# Patient Record
Sex: Female | Born: 1950 | Race: Black or African American | Hispanic: Refuse to answer | Marital: Married | Smoking: Never smoker
Health system: Southern US, Community
[De-identification: ages and names within clinical notes are randomized; demographics above are authoritative.]

## PROBLEM LIST (undated history)

## (undated) DIAGNOSIS — E7800 Pure hypercholesterolemia, unspecified: Secondary | ICD-10-CM

## (undated) DIAGNOSIS — R519 Headache, unspecified: Secondary | ICD-10-CM

## (undated) DIAGNOSIS — M1990 Unspecified osteoarthritis, unspecified site: Secondary | ICD-10-CM

## (undated) DIAGNOSIS — R51 Headache: Secondary | ICD-10-CM

## (undated) DIAGNOSIS — I10 Essential (primary) hypertension: Secondary | ICD-10-CM

## (undated) DIAGNOSIS — K219 Gastro-esophageal reflux disease without esophagitis: Secondary | ICD-10-CM

## (undated) DIAGNOSIS — D649 Anemia, unspecified: Secondary | ICD-10-CM

## (undated) DIAGNOSIS — E119 Type 2 diabetes mellitus without complications: Secondary | ICD-10-CM

## (undated) DIAGNOSIS — I82409 Acute embolism and thrombosis of unspecified deep veins of unspecified lower extremity: Secondary | ICD-10-CM

## (undated) HISTORY — PX: ESOPHAGOGASTRODUODENOSCOPY: SHX1529

## (undated) HISTORY — PX: COLONOSCOPY: SHX174

---

## 1992-12-27 HISTORY — PX: KIDNEY DONATION: SHX685

## 1998-12-27 HISTORY — PX: LIPOMA EXCISION: SHX5283

## 1998-12-27 HISTORY — PX: CARPAL TUNNEL RELEASE: SHX101

## 1999-10-16 ENCOUNTER — Ambulatory Visit (HOSPITAL_COMMUNITY): Admission: RE | Admit: 1999-10-16 | Discharge: 1999-10-16 | Payer: Self-pay | Admitting: General Surgery

## 1999-10-16 ENCOUNTER — Encounter (INDEPENDENT_AMBULATORY_CARE_PROVIDER_SITE_OTHER): Payer: Self-pay | Admitting: Specialist

## 2000-02-19 ENCOUNTER — Encounter: Admission: RE | Admit: 2000-02-19 | Discharge: 2000-02-19 | Payer: Self-pay | Admitting: Orthopedic Surgery

## 2000-02-19 ENCOUNTER — Encounter: Payer: Self-pay | Admitting: Orthopedic Surgery

## 2000-04-11 ENCOUNTER — Encounter: Payer: Self-pay | Admitting: Obstetrics and Gynecology

## 2000-04-11 ENCOUNTER — Encounter: Admission: RE | Admit: 2000-04-11 | Discharge: 2000-04-11 | Payer: Self-pay | Admitting: Obstetrics and Gynecology

## 2001-10-04 ENCOUNTER — Encounter: Payer: Self-pay | Admitting: Obstetrics and Gynecology

## 2001-10-04 ENCOUNTER — Encounter: Admission: RE | Admit: 2001-10-04 | Discharge: 2001-10-04 | Payer: Self-pay | Admitting: Obstetrics and Gynecology

## 2001-10-25 ENCOUNTER — Encounter: Admission: RE | Admit: 2001-10-25 | Discharge: 2001-10-25 | Payer: Self-pay | Admitting: Obstetrics and Gynecology

## 2001-10-25 ENCOUNTER — Encounter: Payer: Self-pay | Admitting: Obstetrics and Gynecology

## 2002-10-12 ENCOUNTER — Encounter: Admission: RE | Admit: 2002-10-12 | Discharge: 2002-10-12 | Payer: Self-pay | Admitting: Obstetrics and Gynecology

## 2002-10-12 ENCOUNTER — Encounter: Payer: Self-pay | Admitting: Obstetrics and Gynecology

## 2003-11-13 ENCOUNTER — Encounter: Admission: RE | Admit: 2003-11-13 | Discharge: 2003-11-13 | Payer: Self-pay | Admitting: Obstetrics and Gynecology

## 2004-12-01 ENCOUNTER — Encounter: Admission: RE | Admit: 2004-12-01 | Discharge: 2004-12-01 | Payer: Self-pay | Admitting: Obstetrics and Gynecology

## 2004-12-27 HISTORY — PX: ANTERIOR CERVICAL DECOMP/DISCECTOMY FUSION: SHX1161

## 2005-02-08 ENCOUNTER — Encounter: Admission: RE | Admit: 2005-02-08 | Discharge: 2005-03-02 | Payer: Self-pay | Admitting: Specialist

## 2005-03-26 ENCOUNTER — Ambulatory Visit (HOSPITAL_COMMUNITY): Admission: RE | Admit: 2005-03-26 | Discharge: 2005-03-27 | Payer: Self-pay | Admitting: Specialist

## 2005-07-02 ENCOUNTER — Emergency Department (HOSPITAL_COMMUNITY): Admission: EM | Admit: 2005-07-02 | Discharge: 2005-07-03 | Payer: Self-pay | Admitting: Emergency Medicine

## 2005-12-02 ENCOUNTER — Encounter: Admission: RE | Admit: 2005-12-02 | Discharge: 2005-12-02 | Payer: Self-pay | Admitting: Obstetrics and Gynecology

## 2005-12-27 DIAGNOSIS — I82409 Acute embolism and thrombosis of unspecified deep veins of unspecified lower extremity: Secondary | ICD-10-CM

## 2005-12-27 HISTORY — DX: Acute embolism and thrombosis of unspecified deep veins of unspecified lower extremity: I82.409

## 2006-01-01 IMAGING — CR DG CHEST 2V
2 series · 2 of 2 positions shown · non-contrast
Comparison: none

CLINICAL DATA: Preoperative respiratory evaluation.  
 CHEST - 2 VIEWS:
 PA and lateral views of the chest dated 03/22/05 are reviewed without prior films.  
 The heart, pulmonary vascularity is felt to be within normal limits.  The bronchovascular markings are prominent but no confluence to suggest active inflammatory change is noted.

[view not recorded (1 of 2)]
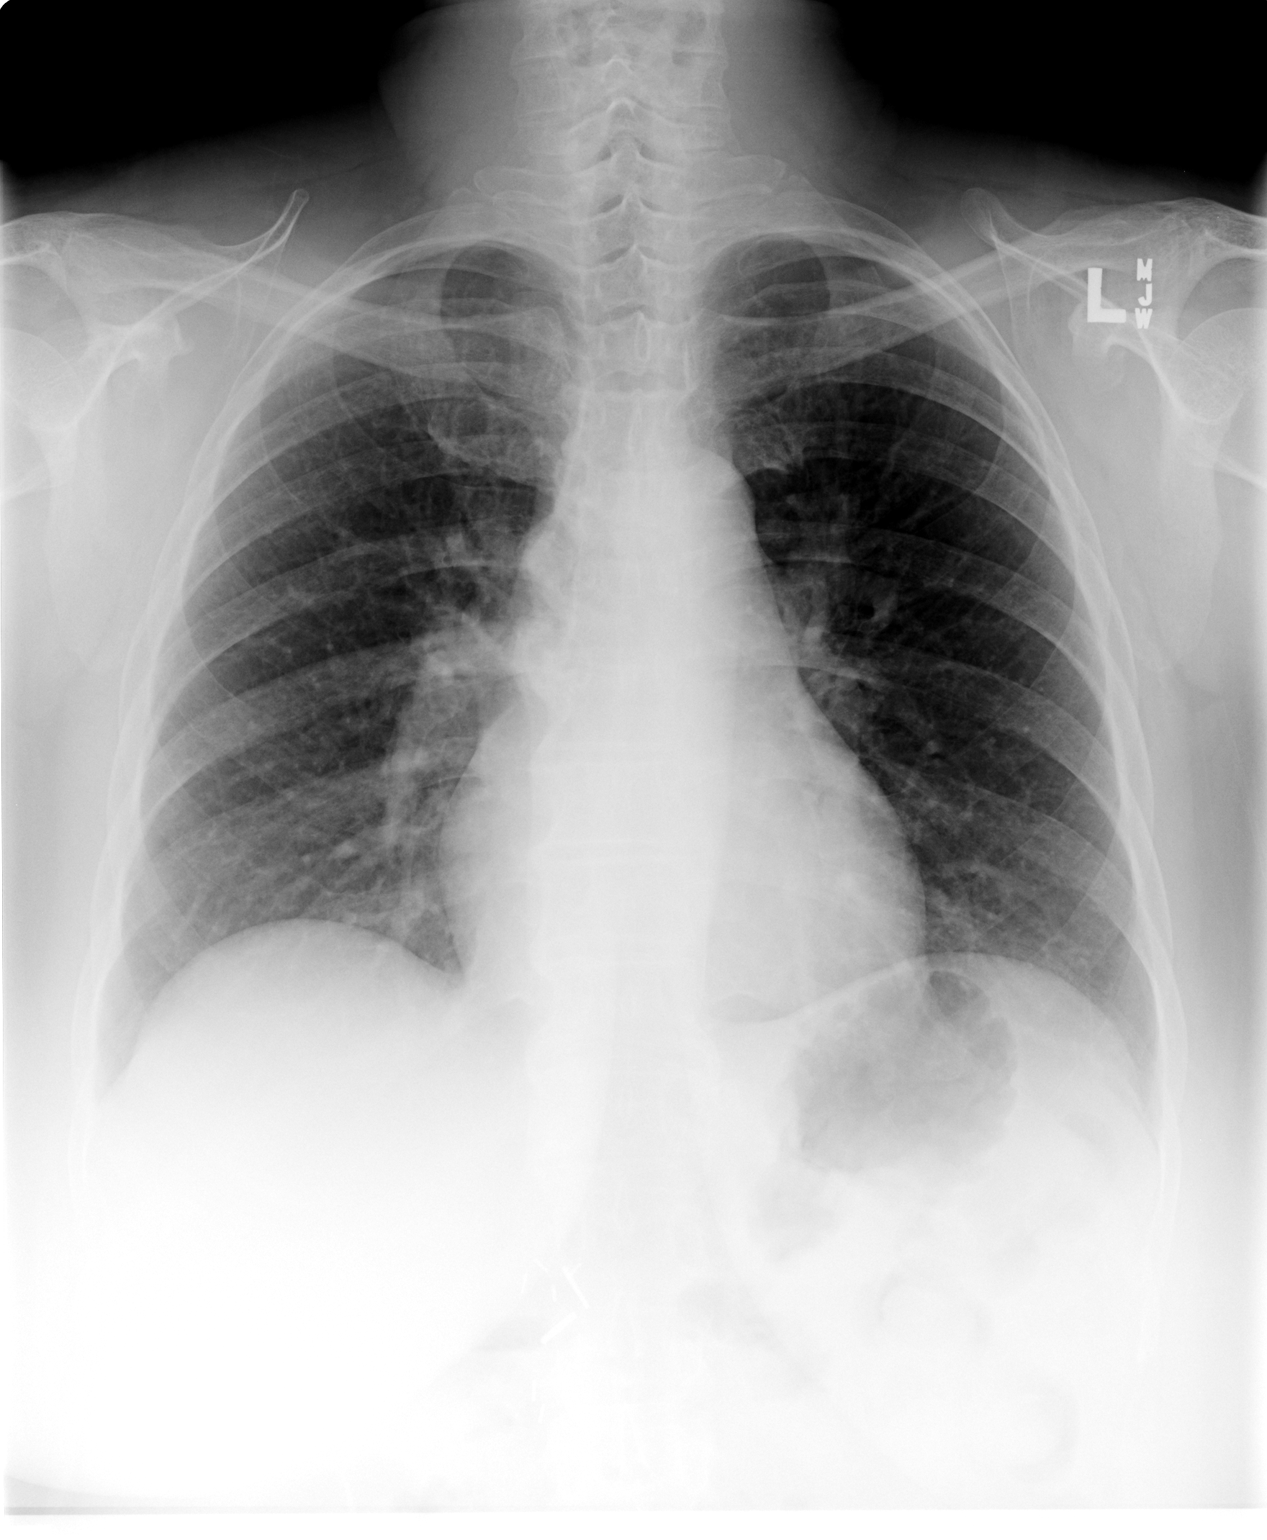

[view not recorded (2 of 2)]
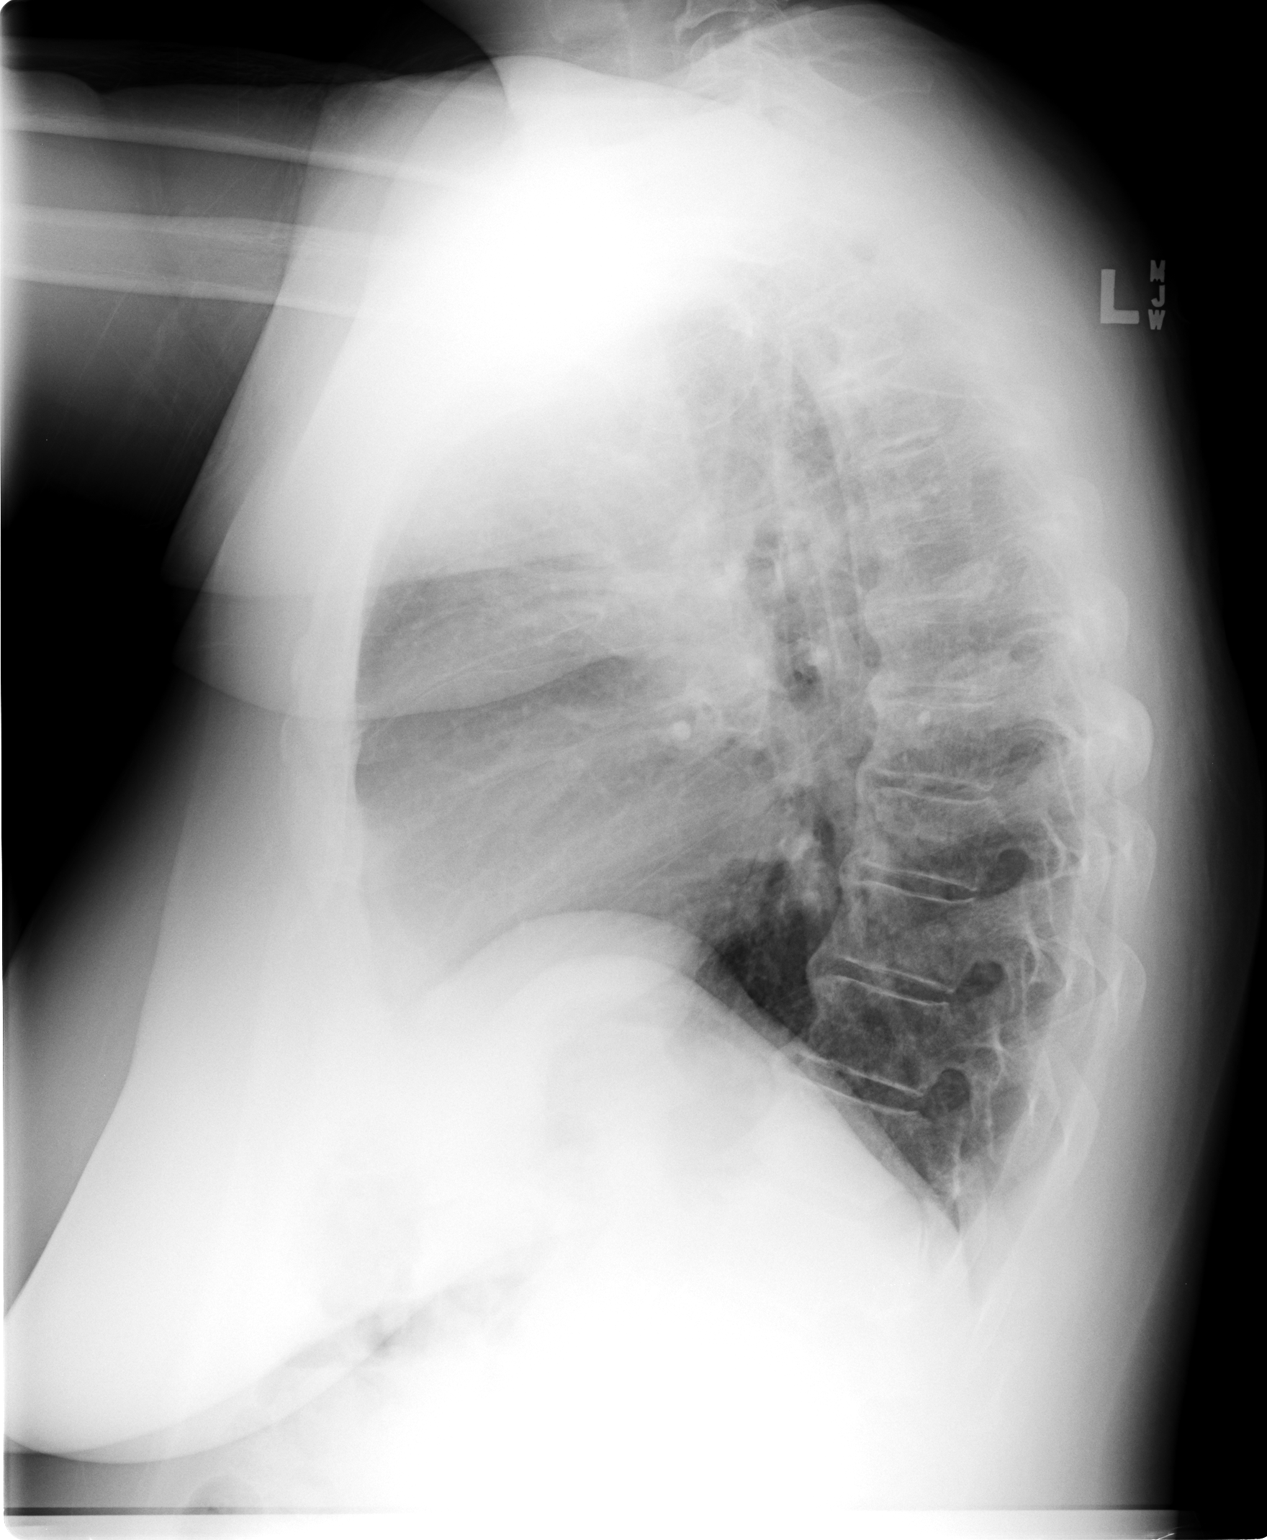

[2 of 2 positions shown; findings below may reference images not displayed]

IMPRESSION: Negative chest for active disease.

## 2006-02-16 ENCOUNTER — Emergency Department (HOSPITAL_COMMUNITY): Admission: EM | Admit: 2006-02-16 | Discharge: 2006-02-16 | Payer: Self-pay | Admitting: Family Medicine

## 2006-02-17 ENCOUNTER — Ambulatory Visit (HOSPITAL_COMMUNITY): Admission: RE | Admit: 2006-02-17 | Discharge: 2006-02-17 | Payer: Self-pay | Admitting: Family Medicine

## 2006-02-17 ENCOUNTER — Emergency Department (HOSPITAL_COMMUNITY): Admission: EM | Admit: 2006-02-17 | Discharge: 2006-02-17 | Payer: Self-pay | Admitting: Family Medicine

## 2006-12-05 ENCOUNTER — Encounter: Admission: RE | Admit: 2006-12-05 | Discharge: 2006-12-05 | Payer: Self-pay | Admitting: Obstetrics and Gynecology

## 2007-03-14 ENCOUNTER — Encounter: Admission: RE | Admit: 2007-03-14 | Discharge: 2007-03-14 | Payer: Self-pay | Admitting: Orthopedic Surgery

## 2008-01-16 ENCOUNTER — Encounter: Admission: RE | Admit: 2008-01-16 | Discharge: 2008-01-16 | Payer: Self-pay | Admitting: Obstetrics and Gynecology

## 2009-01-16 ENCOUNTER — Encounter: Admission: RE | Admit: 2009-01-16 | Discharge: 2009-01-16 | Payer: Self-pay | Admitting: Internal Medicine

## 2009-07-04 ENCOUNTER — Emergency Department (HOSPITAL_COMMUNITY): Admission: EM | Admit: 2009-07-04 | Discharge: 2009-07-04 | Payer: Self-pay | Admitting: Emergency Medicine

## 2010-01-19 ENCOUNTER — Encounter: Admission: RE | Admit: 2010-01-19 | Discharge: 2010-01-19 | Payer: Self-pay | Admitting: Internal Medicine

## 2010-09-09 ENCOUNTER — Encounter: Admission: RE | Admit: 2010-09-09 | Discharge: 2010-09-09 | Payer: Self-pay | Admitting: Specialist

## 2011-01-20 ENCOUNTER — Encounter
Admission: RE | Admit: 2011-01-20 | Discharge: 2011-01-20 | Payer: Self-pay | Source: Home / Self Care | Attending: Internal Medicine | Admitting: Internal Medicine

## 2011-04-04 LAB — DIFFERENTIAL
Basophils Absolute: 0 10*3/uL (ref 0.0–0.1)
Basophils Relative: 1 % (ref 0–1)
Eosinophils Absolute: 0.2 10*3/uL (ref 0.0–0.7)
Eosinophils Relative: 2 % (ref 0–5)
Monocytes Absolute: 0.6 10*3/uL (ref 0.1–1.0)
Neutro Abs: 5.3 10*3/uL (ref 1.7–7.7)

## 2011-04-04 LAB — BASIC METABOLIC PANEL
BUN: 17 mg/dL (ref 6–23)
GFR calc non Af Amer: >60 mL/min (ref >60)
Glucose, Bld: 96 mg/dL (ref 70–99)
Potassium: 4 mEq/L (ref 3.5–5.1)

## 2011-04-04 LAB — URINALYSIS, ROUTINE W REFLEX MICROSCOPIC
Bilirubin Urine: NEGATIVE
Hgb urine dipstick: NEGATIVE
Ketones, ur: NEGATIVE mg/dL
Protein, ur: NEGATIVE mg/dL
Urobilinogen, UA: 0.2 mg/dL (ref 0.0–1.0)

## 2011-04-04 LAB — CBC
HCT: 38.1 % (ref 36.0–46.0)
Platelets: 216 10*3/uL (ref 150–400)
RDW: 13.8 % (ref 11.5–15.5)

## 2011-05-14 NOTE — Op Note (Signed)
Diana Bowers, Diana Bowers NO.:  [id number]   MEDICAL RECORD NO.:  [id number]          PATIENT TYPE:  OIB   LOCATION:  5030                         FACILITY:  [facility]   PHYSICIAN:  Kerrin Champagne, M.D.   DATE OF BIRTH:  1951/01/13   DATE OF PROCEDURE:  03/26/2005  DATE OF DISCHARGE:                                 OPERATIVE REPORT   PREOPERATIVE DIAGNOSIS:  Degenerative disk disease with central herniated  nucleus pulposus and right-sided herniated nucleus pulposus, C5-6.   POSTOPERATIVE DIAGNOSIS:  Central herniated nucleus pulposus, C5-6.   PROCEDURES:  1.  Anterior cervical diskectomy and fusion, C5-6, with right iliac crest      bone graft harvested through a separate incision. 2. Internal fixation      with a 25 mm DePuy locking plate with four 14 mm screws.   SURGEON:  Dr. Vira Browns.   ASSISTANT:  Maud Deed, PAC.   ANESTHESIA:  GOT,  Dr. Sondra Come.   ESTIMATED BLOOD LOSS:  Sixty mL.   DRAINS:  A 7-French TLS drain, anterior neck, sewn in place.   COMPLICATIONS:  None.   BRIEF CLINICAL HISTORY:  A 60 year old female with progressive worsening  pain in her neck with radiation into her left arm greater than right which  has been in both arms at times. She has pain and numbness in the C6  distribution. She underwent preoperative studies that demonstrated protruded  disk at the C5-6 level with some extension to both sides and foraminal  narrowing causing compression of the cord at this segment. Remaining disk  levels show some very mild changes but nothing near that of the C5-6 level  and this is in keeping with her present pain pattern. She had 50% relief of  pain with nerve root block. She presents to undergo anterior diskectomy and  fusion at this site to allow [place] to decompress central portions of her spinal  canal and bilateral neuroforamen.   INTRAOPERATIVE FINDINGS:  As above.   DESCRIPTION OF PROCEDURE:  After adequate general anesthesia, the  patient in  beach-chair position and neck in slight extension with cervical halter  traction of 5 pounds. Bump under the right buttock elevating the right iliac  crest. Both right iliac crest and anterior neck prepped with DuraPrep  solution and draped in the usual manner.  Iodine Vi-Drapes were used and  standard preoperative antibiotics of vancomycin. The patient had an incision  along the left neck about a fingerbreadth and a half around the medial  clavicle in line with the patient's skin incision marked previous to prep  with a skin marker. Incision was through skin and subcutaneous layers down  to the platysma layer. Incision was approximately 4.5 cm in length. Platysma  layer was incised in line with the skin incision on the left side.  Infiltration was carried out Marcaine 0.5%,  1:200,000 epinephrine.  Following division of the platysma layer, the blunt dissection was used to  spread the interval between the trachea and esophagus medially and the  carotid sheath laterally. Interval developed using finger dissection  to the  anterior aspect of the cervical spine. A small bleeder was localized causing  some venous bleeding off the anterior jugular and this was suture-ligated  with a 2-0 Vicryl stitch. Handheld Cloward's were used to expose the  anterior aspect of the cervical spine. Prevertebral fascia was incised with  cautery along the medial border of the longus colli muscle and teased across  the midline identifying the anterior cervical spine region. Spinal needles  with sheath cut only allowing about a centimeter protrude, and were inserted  in the expected C5-6, C6-7 levels. Intraoperative lateral radiograph  demonstrated the needles at C5-6 and C6-7. Under direct visualization, then  the lower needle C6-7 was removed. Using handheld Cloward's for further  localization and exposure, carefully the upper needle was then removed and a  15 blade scalpel used to incise the disk.  Pituitary rongeur was used to  excise disk material to continue identification of this level throughout the  remainder of the case. Medial border longus colli muscle both right and left  sides carefully developed using an electrocautery, Key elevator, and  McCullough retractor inserted with the foot beneath the medial border of the  longus colli muscle. Under loupe magnification and headlamp, 14 mm screw  posts were then inserted for tumor body of C5 and C6 and distraction  obtained across the disk space. A Baer rongeur used to excise osteophytes of  the anterior aspect of the disk space of C5-6. A 3 mm Kerrison was used to  further debride anterior lip osteophytes at this level. High-speed burr was  then used to carefully remove endplates back to the posterior aspect of the  disk space and the posterior annulus. The endplates were decorticated down  to hard cortical bone and subchondral bone, and cartilaginous endplates were  fully debrided. A 1 mm Kerrison introduced over the posterior lip of C6 and  able to excise posterior lip osteophyte here freeing up the patient's  posterior annulus and resecting the posterior annulus with 1 and 2 mm  Kerrison's. The posterior inferior lip osteophyte off of the C5 level was  resected using 1 and 2 mm Kerrison's after first thinning this within the  intervertebral disk space using a high-speed burr. With this, then, the  spinal canal was felt to be completely decompressed. There did appear to be  disk material extending centrally. Both neuroforamen were decompressed using  2 mm Kerrison's.  Some epidural bleeding was encountered which was  controlled using bipolar electrocautery as well as thrombin-soaked Gelfoam.  Gelfoam was removed. Following this, then, the depth the intervertebral disk  space measured 20 mm in height, measured with the green #8 sounder by DePuy and the 8 mm graft was chosen. Right iliac crest bone harvest was obtained  by first  exposing the right iliac crest and this exposure was performed  while radiographs were being developed in regard to the neck incision about  3.5-4 cm in length through the skin and subcu layers carried down to the  superior aspect of the anterior portion of the right iliac crest. Incision  onto the superior aspect iliac crest. Then subperiosteal dissection carried  both medial and lateral protecting the medial aspect of the crest and  lateral crest using both Cobb's and a handheld Cloward. A dual oscillating  saw blade was then used to cut an 8 mm graft. The base of this graft was  then cut using a quarter inch osteotome without difficulty protected medial  structures. This graft  appeared to be excellent quality as well as excellent  height and depth,  approximately 14 mm in depth with a height of  approximately 8 mm. It was carefully tapered to dimensions in the  intervertebral disk space. The graft was then inserted into place after  careful inspection of the disk space indicated there was no further debris  present that could be retropulsed with insertion of the graft. The  intervertebral disk space was debrided of any retained Gelfoam material.  Graft was then inserted and packed into place without difficulty. Screw  posts removed of C5-C6. Bone wax applied to the bleeding screw post holes.  Five pound cervical halter traction released. High-speed burr then used to  carefully smooth the anterior lip osteophytes at the anterior aspect of disk  space of C5-6. A 25-mm plate was easily milled against the anterior aspect  of disk space to provide for even better milling, the plate was slightly  bent using the bending arch.  The plate was placed against the anterior  aspect of the vertebral bodies of C5-C6 over the disk space, and held in  place with a single retaining pin distally. Screws were then placed in the  vertebral body of C5 on the left side first drilling to 14 mm to place the   appropriate 14 mm screw.  Then on the left side of C6, drilling 14 mm to  place a 14 mm screw.  The pin was then removed and the right side C6 screw  was placed on the right side, C5 screw placed without difficulty.  Each of  these were then locked in place. Intraoperative radiograph was obtained  demonstrating the plate and screws in good position, alignment and bone  graft to be in good position and alignment without evidence of retropulsion.  This completed the operative phase procedure.  Inspection of the esophagus  demonstrated no abnormalities. A single 7-French TLS drain was placed in the  depth of the incision over the left side of neck exiting just to the right  of the midline of the incision. This was sewn in place with 2-0 nylon  stitch. The platysma layer approximated with interrupted 3-0 Vicryl sutures.  The deep subcu layers approximated with interrupted 3-0 Vicryl sutures, and the skin closed with a running subcu stitch of 4-0 Vicryl. Tincture of  Benzoin and Steri-Strips applied to the anterior neck area. Right iliac  crest bone graft harvest site carefully hemostased. Bone wax applied to the  cancellus bone surfaces and then Gelfoam. Soft tissues carefully inspected.  There was no active bleeding evident. Periosteum approximated over the iliac  crest with interrupted #1 Vicryl sutures. The deep subcu layers approximated  with interrupted #1 and 0-Vicryl sutures, the more superficial layers with  interrupted 2-0 Vicryl sutures, and the skin closed with a running subcu  stitch of 4-0 Vicryl.  Tincture of Benzoin and Steri-Strips applied. The  iliac crest bone graft harvest site shows an area of stria of the skin and,  hence, it was difficult in approximating but it was able to be approximated  with the subcu stitch. Tincture of Benzoin and Steri-Strips applied, 4x4s  fixed to the skin with Hypafix tape, as was 4x4s fixed to the skin with  Hypafix tape over the anterior neck.  The patient was placed in a  [place] collar. All instrument and sponge counts were correct. The  patient was then reactivated, extubated, and returned recovery room in  satisfactory condition. All instrument and sponge  counts again were correct.   The number [id number] thank you      JEN/MEDQ  D:  03/26/2005  T:  03/27/2005  Job:  [id number]

## 2011-12-22 ENCOUNTER — Other Ambulatory Visit: Payer: Self-pay | Admitting: Internal Medicine

## 2011-12-22 DIAGNOSIS — Z1231 Encounter for screening mammogram for malignant neoplasm of breast: Secondary | ICD-10-CM

## 2012-01-24 ENCOUNTER — Ambulatory Visit
Admission: RE | Admit: 2012-01-24 | Discharge: 2012-01-24 | Disposition: A | Discharge status: Home / Self Care | Payer: 59 | Source: Ambulatory Visit | Attending: Internal Medicine | Admitting: Internal Medicine

## 2012-01-24 DIAGNOSIS — Z1231 Encounter for screening mammogram for malignant neoplasm of breast: Secondary | ICD-10-CM

## 2012-12-21 ENCOUNTER — Encounter (HOSPITAL_COMMUNITY): Payer: Self-pay | Admitting: Pharmacy Technician

## 2012-12-25 ENCOUNTER — Encounter (HOSPITAL_COMMUNITY): Payer: Self-pay

## 2012-12-25 ENCOUNTER — Encounter (HOSPITAL_COMMUNITY)
Admission: RE | Admit: 2012-12-25 | Discharge: 2012-12-25 | Disposition: A | Discharge status: Home / Self Care | Payer: 59 | Source: Ambulatory Visit | Attending: Specialist | Admitting: Specialist

## 2012-12-25 ENCOUNTER — Ambulatory Visit (HOSPITAL_COMMUNITY)
Admission: RE | Admit: 2012-12-25 | Discharge: 2012-12-25 | Disposition: A | Discharge status: Home / Self Care | Payer: 59 | Source: Ambulatory Visit | Attending: Specialist | Admitting: Specialist

## 2012-12-25 DIAGNOSIS — Z01812 Encounter for preprocedural laboratory examination: Secondary | ICD-10-CM | POA: Insufficient documentation

## 2012-12-25 DIAGNOSIS — M5380 Other specified dorsopathies, site unspecified: Secondary | ICD-10-CM | POA: Insufficient documentation

## 2012-12-25 DIAGNOSIS — Z01818 Encounter for other preprocedural examination: Secondary | ICD-10-CM | POA: Insufficient documentation

## 2012-12-25 HISTORY — DX: Essential (primary) hypertension: I10

## 2012-12-25 HISTORY — DX: Type 2 diabetes mellitus without complications: E11.9

## 2012-12-25 HISTORY — DX: Unspecified osteoarthritis, unspecified site: M19.90

## 2012-12-25 HISTORY — DX: Gastro-esophageal reflux disease without esophagitis: K21.9

## 2012-12-25 LAB — CBC
HCT: 40.4 % (ref 36.0–46.0)
MCH: 25.3 pg — ABNORMAL LOW (ref 26.0–34.0)
MCV: 79.2 fL (ref 78.0–100.0)
RDW: 13.9 % (ref 11.5–15.5)
WBC: 9.7 10*3/uL (ref 4.0–10.5)

## 2012-12-25 LAB — ABO/RH: ABO/RH(D): O POS

## 2012-12-25 LAB — BASIC METABOLIC PANEL
BUN: 20 mg/dL (ref 6–23)
CO2: 23 mEq/L (ref 19–32)
Calcium: 9.9 mg/dL (ref 8.4–10.5)
Chloride: 102 mEq/L (ref 96–112)
Creatinine, Ser: 0.91 mg/dL (ref 0.50–1.10)
Glucose, Bld: 90 mg/dL (ref 70–99)

## 2012-12-25 LAB — TYPE AND SCREEN

## 2012-12-25 LAB — APTT: aPTT: 35 seconds (ref 24–37)

## 2012-12-25 NOTE — Pre-Procedure Instructions (Addendum)
22 Diana Bowers  12/25/2012   Your procedure is scheduled on:  Mon, Jan 6 @ 7:30 AM  Report to [facility] at 5:30 AM.  Call this number if you have problems the morning of surgery: [phone]   Remember:   Do not eat food:or drinkAfter Midnight.    Take these medicines the morning of surgery with A SIP OF WATER: Diltiazem(Cardizem) and Aciphex(Rabeprazole)   Do not wear jewelry, make-up or nail polish.  Do not wear lotions, powders, or perfumes. You may not wear deodorant.  Do not shave 48 hours prior to surgery.   Do not bring valuables to the hospital.  Contacts, dentures or bridgework may not be worn into surgery.  Leave suitcase in the car. After surgery it may be brought to your room.  For patients admitted to the hospital, checkout time is 11:00 AM the day of discharge.     Spouse Diana Bowers [phone]  Patients discharged the day of surgery will not be allowed to drive home.  Special Instructions: Shower using CHG 2 nights before surgery and the night before surgery.  If you shower the day of surgery use CHG.  Use special wash - you have one bottle of CHG for all showers.  You should use approximately 1/3 of the bottle for each shower.   Please read over the following fact sheets that you were given: Pain Booklet, Coughing and Deep Breathing, Blood Transfusion Information, Total Joint Packet, MRSA Information and Surgical Site Infection Prevention

## 2012-12-25 NOTE — Progress Notes (Signed)
Office called to release orders and put in permit verbage

## 2012-12-26 ENCOUNTER — Other Ambulatory Visit (HOSPITAL_COMMUNITY): Payer: Self-pay | Admitting: Specialist

## 2012-12-28 NOTE — H&P (Signed)
Patient examined  H&Pand lab reviewed with Dondra Spry.

## 2012-12-28 NOTE — H&P (Addendum)
TOTAL KNEE ADMISSION H&P  Patient is being admitted for left total knee arthroplasty.  Subjective:  Chief Complaint:left knee pain.  HPI: Diana Bowers, 62 y.o. female, has a history of pain and functional disability in the left knee due to arthritis and has failed non-surgical conservative treatments for greater than 12 weeks to includecorticosteriod injections, viscosupplementation injections and use of assistive devices.  Onset of symptoms was gradual, starting 5 years ago with gradually worsening course since that time. The patient noted no past surgery on the left knee(s).  Patient currently rates pain in the left knee(s) at 8 out of 10 with activity. Patient has night pain, worsening of pain with activity and weight bearing and crepitus.  Patient has evidence of periarticular osteophytes and joint space narrowing by imaging studies. There is no active infection.  There are no active problems to display for this patient.  Past Medical History  Diagnosis Date  . Hypertension   . Blood clot in vein 07    lft leg due to birth control pill  . GERD (gastroesophageal reflux disease)   . Arthritis   . Diabetes mellitus without complication     borderline no med    Past Surgical History  Procedure Date  . Cervical disc surgery 06  . Carpal tunnel release 00    rt  . 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 Lipoma excision 00    back  . Cesarean section     Prescriptions prior to admission  Medication Sig Dispense Refill  . CALCIUM PO Take 1 tablet by mouth daily.      Marland Kitchen diltiazem (CARDIZEM CD) 360 MG 24 hr capsule Take 360 mg by mouth daily.      . Pitavastatin Calcium (LIVALO) 2 MG TABS Take 2 tablets by mouth daily.      . RABEprazole (ACIPHEX) 20 MG tablet Take 20 mg by mouth daily.      . traMADol (ULTRAM) 50 MG tablet Take 100 mg by mouth every 6 (six) hours as needed.      . Vitamin D, Ergocalciferol, (DRISDOL) 50000 UNITS CAPS Take 50,000 Units by mouth every 7 (seven) days. Sunday        Allergies  Allergen Reactions  . Penicillins Hives    History  Substance Use Topics  . Smoking status: Never Smoker   . Smokeless tobacco: Not on file  . Alcohol Use: No    History reviewed. No pertinent family history.   Review of Systems  Constitutional: Negative.   HENT: Negative.   Eyes: Negative.   Respiratory: Negative.   Cardiovascular: Negative.   Gastrointestinal: Negative.   Genitourinary: Negative.   Musculoskeletal:       Left knee pain  Skin: Negative.   Neurological: Negative.   Endo/Heme/Allergies: Negative.   Psychiatric/Behavioral: Negative.     Objective:  Physical Exam  Constitutional: She is oriented to person, place, and time. She appears well-developed and well-nourished.  HENT:  Head: Normocephalic and atraumatic.  Eyes: EOM are normal. Pupils are equal, round, and reactive to light.  Neck: Normal range of motion.  Cardiovascular: Normal rate, regular rhythm, normal heart sounds and intact distal pulses.   Respiratory: Effort normal and breath sounds normal.  GI: Soft.  Musculoskeletal:       10  degree flexion contracture to the left knee.  Flexion to 110 degrees.  Varus deformity.   Neurological: She is alert and oriented to person, place, and time.  Skin: Skin is warm and dry.  Psychiatric:  She has a normal mood and affect.    Vital signs in last 24 hours: Temp:  [98.6 F (37 C)] 98.6 F (37 C) (01/06 0623) Pulse Rate:  [73] 73  (01/06 0623) Resp:  [18] 18  (01/06 0623) BP: (156)/(75) 156/75 mmHg (01/06 0623) SpO2:  [93 %] 93 % (01/06 4098)  Labs:   There is no height or weight on file to calculate BMI.   Imaging Review Plain radiographs demonstrate severe degenerative joint disease of the left knee(s). The overall alignment ismild varus. The bone quality appears to be adequate for age and reported activity level.  Assessment/Plan:  End stage arthritis, left knee   The patient history, physical examination, clinical  judgment of the provider and imaging studies are consistent with end stage degenerative joint disease of the left knee(s) and total knee arthroplasty is deemed medically necessary. The treatment options including medical management, injection therapy arthroscopy and arthroplasty were discussed at length. The risks and benefits of total knee arthroplasty were presented and reviewed. The risks due to aseptic loosening, infection, stiffness, patella tracking problems, thromboembolic complications and other imponderables were discussed. The patient acknowledged the explanation, agreed to proceed with the plan and consent was signed. Patient is being admitted for inpatient treatment for surgery, pain control, PT, OT, prophylactic antibiotics, VTE prophylaxis, progressive ambulation and ADL's and discharge planning. The patient is planning to be discharged to skilled nursing facility.  She has selected [facility] for rehab.  The patient has been re-examined, and the chart reviewed, and there have been no interval changes to the documented history and physical.    The risks, benefits, and alternatives have been discussed at length, and the patient is willing to proceed.

## 2012-12-31 MED ORDER — VANCOMYCIN HCL 10 G IV SOLR
1500.0000 mg | INTRAVENOUS | Status: AC
  Administered 2013-01-01: 1500 mg via INTRAVENOUS
  Filled 2012-12-31: qty 1500

## 2013-01-01 ENCOUNTER — Encounter (HOSPITAL_COMMUNITY): Payer: Self-pay | Admitting: Specialist

## 2013-01-01 ENCOUNTER — Encounter (HOSPITAL_COMMUNITY): Payer: Self-pay | Admitting: Anesthesiology

## 2013-01-01 ENCOUNTER — Encounter (HOSPITAL_COMMUNITY): Payer: Self-pay | Admitting: *Deleted

## 2013-01-01 ENCOUNTER — Inpatient Hospital Stay (HOSPITAL_COMMUNITY): Payer: 59 | Admitting: Anesthesiology

## 2013-01-01 ENCOUNTER — Encounter (HOSPITAL_COMMUNITY)
Admission: RE | Disposition: A | Discharge status: Skilled Nursing Facility | Payer: Self-pay | Source: Ambulatory Visit | Attending: Specialist

## 2013-01-01 ENCOUNTER — Inpatient Hospital Stay (HOSPITAL_COMMUNITY)
Admission: RE | Admit: 2013-01-01 | Discharge: 2013-01-04 | DRG: 470 | Disposition: A | Discharge status: Skilled Nursing Facility | Payer: 59 | Source: Ambulatory Visit | Attending: Specialist | Admitting: Specialist

## 2013-01-01 DIAGNOSIS — M1710 Unilateral primary osteoarthritis, unspecified knee: Principal | ICD-10-CM | POA: Diagnosis present

## 2013-01-01 DIAGNOSIS — M1712 Unilateral primary osteoarthritis, left knee: Secondary | ICD-10-CM | POA: Diagnosis present

## 2013-01-01 DIAGNOSIS — E119 Type 2 diabetes mellitus without complications: Secondary | ICD-10-CM | POA: Diagnosis present

## 2013-01-01 DIAGNOSIS — Z86718 Personal history of other venous thrombosis and embolism: Secondary | ICD-10-CM

## 2013-01-01 DIAGNOSIS — Z880 Allergy status to penicillin: Secondary | ICD-10-CM

## 2013-01-01 DIAGNOSIS — E7800 Pure hypercholesterolemia, unspecified: Secondary | ICD-10-CM | POA: Diagnosis present

## 2013-01-01 DIAGNOSIS — I10 Essential (primary) hypertension: Secondary | ICD-10-CM | POA: Diagnosis present

## 2013-01-01 DIAGNOSIS — D62 Acute posthemorrhagic anemia: Secondary | ICD-10-CM | POA: Diagnosis not present

## 2013-01-01 DIAGNOSIS — K219 Gastro-esophageal reflux disease without esophagitis: Secondary | ICD-10-CM | POA: Diagnosis present

## 2013-01-01 DIAGNOSIS — Z79899 Other long term (current) drug therapy: Secondary | ICD-10-CM

## 2013-01-01 HISTORY — PX: KNEE ARTHROPLASTY: SHX992

## 2013-01-01 HISTORY — DX: Pure hypercholesterolemia, unspecified: E78.00

## 2013-01-01 HISTORY — DX: Acute embolism and thrombosis of unspecified deep veins of unspecified lower extremity: I82.409

## 2013-01-01 HISTORY — PX: REPLACEMENT TOTAL KNEE: SUR1224

## 2013-01-01 LAB — URINALYSIS, ROUTINE W REFLEX MICROSCOPIC
Glucose, UA: NEGATIVE mg/dL
Nitrite: NEGATIVE
Protein, ur: 30 mg/dL — AB

## 2013-01-01 LAB — URINE MICROSCOPIC-ADD ON

## 2013-01-01 SURGERY — ARTHROPLASTY, KNEE, TOTAL, USING IMAGELESS COMPUTER-ASSISTED NAVIGATION
Anesthesia: General | Site: Knee | Laterality: Left | Wound class: Clean

## 2013-01-01 MED ORDER — MORPHINE SULFATE 2 MG/ML IJ SOLN: 2.0000 mg | INTRAMUSCULAR | Status: DC | PRN

## 2013-01-01 MED ORDER — OXYCODONE HCL 5 MG/5ML PO SOLN: 5.0000 mg | Freq: Once | ORAL | Status: DC | PRN

## 2013-01-01 MED ORDER — BUPIVACAINE-EPINEPHRINE PF 0.5-1:200000 % IJ SOLN
INTRAMUSCULAR | Status: DC | PRN
  Administered 2013-01-01: 150 mg

## 2013-01-01 MED ORDER — ALUM & MAG HYDROXIDE-SIMETH 200-200-20 MG/5ML PO SUSP: 30.0000 mL | ORAL | Status: DC | PRN

## 2013-01-01 MED ORDER — HYDROMORPHONE HCL PF 1 MG/ML IJ SOLN
INTRAMUSCULAR | Status: AC
  Administered 2013-01-01: 0.5 mg via INTRAVENOUS
  Filled 2013-01-01: qty 2

## 2013-01-01 MED ORDER — MORPHINE SULFATE (PF) 1 MG/ML IV SOLN
INTRAVENOUS | Status: DC
  Administered 2013-01-01: 13:00:00 via INTRAVENOUS
  Administered 2013-01-01: 9 mg via INTRAVENOUS
  Administered 2013-01-02: 1.5 mg via INTRAVENOUS

## 2013-01-01 MED ORDER — SODIUM CHLORIDE 0.9 % IJ SOLN: 9.0000 mL | INTRAMUSCULAR | Status: DC | PRN

## 2013-01-01 MED ORDER — SODIUM CHLORIDE 0.9 % IR SOLN
Status: DC | PRN
  Administered 2013-01-01: 3000 mL

## 2013-01-01 MED ORDER — OXYCODONE HCL 5 MG PO TABS
5.0000 mg | ORAL_TABLET | ORAL | Status: DC | PRN
  Administered 2013-01-02 – 2013-01-04 (×8): 10 mg via ORAL
  Filled 2013-01-01 (×8): qty 2

## 2013-01-01 MED ORDER — RIVAROXABAN 10 MG PO TABS
10.0000 mg | ORAL_TABLET | Freq: Every day | ORAL | Status: DC
  Administered 2013-01-02 – 2013-01-04 (×3): 10 mg via ORAL
  Filled 2013-01-01 (×5): qty 1

## 2013-01-01 MED ORDER — CELECOXIB 200 MG PO CAPS
200.0000 mg | ORAL_CAPSULE | Freq: Two times a day (BID) | ORAL | Status: DC
  Administered 2013-01-01 – 2013-01-02 (×2): 200 mg via ORAL
  Filled 2013-01-01 (×3): qty 1

## 2013-01-01 MED ORDER — PHENYLEPHRINE HCL 10 MG/ML IJ SOLN
INTRAMUSCULAR | Status: DC | PRN
  Administered 2013-01-01 (×2): 80 ug via INTRAVENOUS
  Administered 2013-01-01: 40 ug via INTRAVENOUS

## 2013-01-01 MED ORDER — CHLORHEXIDINE GLUCONATE 4 % EX LIQD: 60.0000 mL | Freq: Once | CUTANEOUS | Status: DC

## 2013-01-01 MED ORDER — DIPHENHYDRAMINE HCL 12.5 MG/5ML PO ELIX
12.5000 mg | ORAL_SOLUTION | Freq: Four times a day (QID) | ORAL | Status: DC | PRN
  Filled 2013-01-01: qty 10

## 2013-01-01 MED ORDER — TRAMADOL HCL 50 MG PO TABS
100.0000 mg | ORAL_TABLET | Freq: Four times a day (QID) | ORAL | Status: DC | PRN
  Administered 2013-01-02: 100 mg via ORAL
  Filled 2013-01-01: qty 2

## 2013-01-01 MED ORDER — KETOROLAC TROMETHAMINE 15 MG/ML IJ SOLN
15.0000 mg | Freq: Four times a day (QID) | INTRAMUSCULAR | Status: AC
  Administered 2013-01-01 – 2013-01-02 (×4): 15 mg via INTRAVENOUS
  Filled 2013-01-01 (×4): qty 1

## 2013-01-01 MED ORDER — HYDROMORPHONE HCL PF 1 MG/ML IJ SOLN
0.2500 mg | INTRAMUSCULAR | Status: DC | PRN
  Administered 2013-01-01 (×4): 0.5 mg via INTRAVENOUS

## 2013-01-01 MED ORDER — FENTANYL CITRATE 0.05 MG/ML IJ SOLN
INTRAMUSCULAR | Status: DC | PRN
  Administered 2013-01-01 (×3): 50 ug via INTRAVENOUS
  Administered 2013-01-01: 100 ug via INTRAVENOUS
  Administered 2013-01-01 (×2): 50 ug via INTRAVENOUS
  Administered 2013-01-01: 100 ug via INTRAVENOUS
  Administered 2013-01-01: 50 ug via INTRAVENOUS

## 2013-01-01 MED ORDER — LACTATED RINGERS IV SOLN
INTRAVENOUS | Status: DC | PRN
  Administered 2013-01-01 (×3): via INTRAVENOUS

## 2013-01-01 MED ORDER — ATORVASTATIN CALCIUM 10 MG PO TABS
10.0000 mg | ORAL_TABLET | Freq: Every day | ORAL | Status: DC
  Administered 2013-01-01 – 2013-01-03 (×3): 10 mg via ORAL
  Filled 2013-01-01 (×4): qty 1

## 2013-01-01 MED ORDER — POTASSIUM CHLORIDE IN NACL 20-0.9 MEQ/L-% IV SOLN
INTRAVENOUS | Status: DC
  Administered 2013-01-01 – 2013-01-02 (×2): via INTRAVENOUS
  Filled 2013-01-01 (×9): qty 1000

## 2013-01-01 MED ORDER — NALOXONE HCL 0.4 MG/ML IJ SOLN: 0.4000 mg | INTRAMUSCULAR | Status: DC | PRN

## 2013-01-01 MED ORDER — MENTHOL 3 MG MT LOZG: 1.0000 | LOZENGE | OROMUCOSAL | Status: DC | PRN

## 2013-01-01 MED ORDER — METOCLOPRAMIDE HCL 10 MG PO TABS: 5.0000 mg | ORAL_TABLET | Freq: Three times a day (TID) | ORAL | Status: DC | PRN

## 2013-01-01 MED ORDER — KETOROLAC TROMETHAMINE 30 MG/ML IJ SOLN
INTRAMUSCULAR | Status: AC
  Filled 2013-01-01: qty 1

## 2013-01-01 MED ORDER — 0.9 % SODIUM CHLORIDE (POUR BTL) OPTIME
TOPICAL | Status: DC | PRN
  Administered 2013-01-01: 1000 mL

## 2013-01-01 MED ORDER — ONDANSETRON HCL 4 MG/2ML IJ SOLN: 4.0000 mg | Freq: Four times a day (QID) | INTRAMUSCULAR | Status: DC | PRN

## 2013-01-01 MED ORDER — DILTIAZEM HCL ER COATED BEADS 360 MG PO CP24
360.0000 mg | ORAL_CAPSULE | Freq: Every day | ORAL | Status: DC
  Administered 2013-01-01 – 2013-01-04 (×4): 360 mg via ORAL
  Filled 2013-01-01 (×6): qty 1

## 2013-01-01 MED ORDER — PROMETHAZINE HCL 25 MG/ML IJ SOLN: 6.2500 mg | INTRAMUSCULAR | Status: DC | PRN

## 2013-01-01 MED ORDER — ACETAMINOPHEN 325 MG PO TABS: 650.0000 mg | ORAL_TABLET | Freq: Four times a day (QID) | ORAL | Status: DC | PRN

## 2013-01-01 MED ORDER — FERROUS SULFATE 325 (65 FE) MG PO TABS
325.0000 mg | ORAL_TABLET | Freq: Two times a day (BID) | ORAL | Status: DC
  Administered 2013-01-02 – 2013-01-04 (×5): 325 mg via ORAL
  Filled 2013-01-01 (×7): qty 1

## 2013-01-01 MED ORDER — MORPHINE SULFATE (PF) 1 MG/ML IV SOLN
INTRAVENOUS | Status: AC
  Filled 2013-01-01: qty 25

## 2013-01-01 MED ORDER — ONDANSETRON HCL 4 MG PO TABS: 4.0000 mg | ORAL_TABLET | Freq: Four times a day (QID) | ORAL | Status: DC | PRN

## 2013-01-01 MED ORDER — PROPOFOL 10 MG/ML IV BOLUS
INTRAVENOUS | Status: DC | PRN
  Administered 2013-01-01: 200 mg via INTRAVENOUS

## 2013-01-01 MED ORDER — MIDAZOLAM HCL 5 MG/5ML IJ SOLN
INTRAMUSCULAR | Status: DC | PRN
  Administered 2013-01-01: 2 mg via INTRAVENOUS

## 2013-01-01 MED ORDER — DIPHENHYDRAMINE HCL 50 MG/ML IJ SOLN: 12.5000 mg | Freq: Four times a day (QID) | INTRAMUSCULAR | Status: DC | PRN

## 2013-01-01 MED ORDER — PHENOL 1.4 % MT LIQD: 1.0000 | OROMUCOSAL | Status: DC | PRN

## 2013-01-01 MED ORDER — METOCLOPRAMIDE HCL 5 MG/ML IJ SOLN: 5.0000 mg | Freq: Three times a day (TID) | INTRAMUSCULAR | Status: DC | PRN

## 2013-01-01 MED ORDER — ACETAMINOPHEN 650 MG RE SUPP: 650.0000 mg | Freq: Four times a day (QID) | RECTAL | Status: DC | PRN

## 2013-01-01 MED ORDER — PHENYLEPHRINE HCL 10 MG/ML IJ SOLN
10.0000 mg | INTRAVENOUS | Status: DC | PRN
  Administered 2013-01-01: 10 ug/min via INTRAVENOUS

## 2013-01-01 MED ORDER — PANTOPRAZOLE SODIUM 40 MG PO TBEC
40.0000 mg | DELAYED_RELEASE_TABLET | Freq: Every day | ORAL | Status: DC
  Administered 2013-01-01 – 2013-01-04 (×4): 40 mg via ORAL
  Filled 2013-01-01 (×4): qty 1

## 2013-01-01 MED ORDER — ONDANSETRON HCL 4 MG/2ML IJ SOLN
INTRAMUSCULAR | Status: DC | PRN
  Administered 2013-01-01: 4 mg via INTRAVENOUS

## 2013-01-01 MED ORDER — LIDOCAINE HCL (CARDIAC) 20 MG/ML IV SOLN
INTRAVENOUS | Status: DC | PRN
  Administered 2013-01-01: 100 mg via INTRAVENOUS

## 2013-01-01 MED ORDER — OXYCODONE HCL 5 MG PO TABS: 5.0000 mg | ORAL_TABLET | Freq: Once | ORAL | Status: DC | PRN

## 2013-01-01 MED ORDER — VITAMIN D (ERGOCALCIFEROL) 1.25 MG (50000 UNIT) PO CAPS: 50000.0000 [IU] | ORAL_CAPSULE | ORAL | Status: DC

## 2013-01-01 SURGICAL SUPPLY — 70 items
ADH SKN CLS APL DERMABOND .7 (GAUZE/BANDAGES/DRESSINGS) ×1
APL SKNCLS STERI-STRIP NONHPOA (GAUZE/BANDAGES/DRESSINGS) ×1
BANDAGE ELASTIC 4 VELCRO ST LF (GAUZE/BANDAGES/DRESSINGS) ×2 IMPLANT
BANDAGE ELASTIC 6 VELCRO ST LF (GAUZE/BANDAGES/DRESSINGS) ×1 IMPLANT
BANDAGE ESMARK 6X9 LF (GAUZE/BANDAGES/DRESSINGS) ×1 IMPLANT
BENZOIN TINCTURE PRP APPL 2/3 (GAUZE/BANDAGES/DRESSINGS) ×2 IMPLANT
BLADE SAG 18X100X1.27 (BLADE) ×2 IMPLANT
BLADE SAGITTAL 25.0X1.27X90 (BLADE) IMPLANT
BLADE SAW SGTL 13X75X1.27 (BLADE) ×2 IMPLANT
BNDG CMPR 9X6 STRL LF SNTH (GAUZE/BANDAGES/DRESSINGS) ×1
BNDG CMPR MED 10X6 ELC LF (GAUZE/BANDAGES/DRESSINGS) ×1
BNDG ELASTIC 6X10 VLCR STRL LF (GAUZE/BANDAGES/DRESSINGS) ×2 IMPLANT
BNDG ESMARK 6X9 LF (GAUZE/BANDAGES/DRESSINGS) ×2
CEMENT HV SMART SET (Cement) ×2 IMPLANT
CLOTH BEACON ORANGE TIMEOUT ST (SAFETY) ×2 IMPLANT
COVER SURGICAL LIGHT HANDLE (MISCELLANEOUS) ×2 IMPLANT
CUFF TOURNIQUET SINGLE 34IN LL (TOURNIQUET CUFF) IMPLANT
CUFF TOURNIQUET SINGLE 44IN (TOURNIQUET CUFF) ×1 IMPLANT
DERMABOND ADVANCED (GAUZE/BANDAGES/DRESSINGS) ×1
DERMABOND ADVANCED .7 DNX12 (GAUZE/BANDAGES/DRESSINGS) IMPLANT
DRAPE INCISE IOBAN 66X45 STRL (DRAPES) ×1 IMPLANT
DRAPE ORTHO SPLIT 77X108 STRL (DRAPES) ×4
DRAPE SURG ORHT 6 SPLT 77X108 (DRAPES) ×2 IMPLANT
DRAPE U-SHAPE 47X51 STRL (DRAPES) ×2 IMPLANT
DRSG ADAPTIC 3X8 NADH LF (GAUZE/BANDAGES/DRESSINGS) ×2 IMPLANT
DRSG PAD ABDOMINAL 8X10 ST (GAUZE/BANDAGES/DRESSINGS) ×2 IMPLANT
DURAPREP 26ML APPLICATOR (WOUND CARE) ×2 IMPLANT
ELECT REM PT RETURN 9FT ADLT (ELECTROSURGICAL) ×2
ELECTRODE REM PT RTRN 9FT ADLT (ELECTROSURGICAL) ×1 IMPLANT
EVACUATOR 1/8 PVC DRAIN (DRAIN) ×1 IMPLANT
FACESHIELD LNG OPTICON STERILE (SAFETY) ×4 IMPLANT
GLOVE BIOGEL PI IND STRL 7.5 (GLOVE) ×1 IMPLANT
GLOVE BIOGEL PI INDICATOR 7.5 (GLOVE) ×1
GLOVE ECLIPSE 7.0 STRL STRAW (GLOVE) ×2 IMPLANT
GLOVE ECLIPSE 8.5 STRL (GLOVE) ×2 IMPLANT
GLOVE SURG 8.5 LATEX PF (GLOVE) ×2 IMPLANT
GOWN PREVENTION PLUS XXLARGE (GOWN DISPOSABLE) ×2 IMPLANT
GOWN STRL NON-REIN LRG LVL3 (GOWN DISPOSABLE) ×4 IMPLANT
HANDPIECE INTERPULSE COAX TIP (DISPOSABLE) ×2
IMMOBILIZER KNEE 22 UNIV (SOFTGOODS) ×1 IMPLANT
KIT BASIN OR (CUSTOM PROCEDURE TRAY) ×2 IMPLANT
KIT ROOM TURNOVER OR (KITS) ×2 IMPLANT
MANIFOLD NEPTUNE II (INSTRUMENTS) ×2 IMPLANT
MARKER SPHERE PSV REFLC THRD 5 (MARKER) ×6 IMPLANT
NS IRRIG 1000ML POUR BTL (IV SOLUTION) ×2 IMPLANT
PACK TOTAL JOINT (CUSTOM PROCEDURE TRAY) ×2 IMPLANT
PAD ARMBOARD 7.5X6 YLW CONV (MISCELLANEOUS) ×4 IMPLANT
PAD CAST 4YDX4 CTTN HI CHSV (CAST SUPPLIES) ×1 IMPLANT
PADDING CAST COTTON 4X4 STRL (CAST SUPPLIES) ×2
PADDING CAST COTTON 6X4 STRL (CAST SUPPLIES) ×2 IMPLANT
PIN SCHANZ 4MM 130MM (PIN) ×8 IMPLANT
SET HNDPC FAN SPRY TIP SCT (DISPOSABLE) ×1 IMPLANT
SPONGE GAUZE 4X4 12PLY (GAUZE/BANDAGES/DRESSINGS) ×2 IMPLANT
STAPLER VISISTAT 35W (STAPLE) IMPLANT
STRIP CLOSURE SKIN 1/2X4 (GAUZE/BANDAGES/DRESSINGS) ×4 IMPLANT
SUCTION FRAZIER TIP 10 FR DISP (SUCTIONS) IMPLANT
SUT BONE WAX W31G (SUTURE) IMPLANT
SUT VIC AB 0 CT1 27 (SUTURE) ×6
SUT VIC AB 0 CT1 27XBRD ANBCTR (SUTURE) ×2 IMPLANT
SUT VIC AB 1 CT1 27 (SUTURE) ×12
SUT VIC AB 1 CT1 27XBRD ANBCTR (SUTURE) ×6 IMPLANT
SUT VIC AB 2-0 CT1 27 (SUTURE) ×6
SUT VIC AB 2-0 CT1 TAPERPNT 27 (SUTURE) ×3 IMPLANT
SUT VIC AB 3-0 FS2 27 (SUTURE) ×2 IMPLANT
SYR CONTROL 10ML LL (SYRINGE) IMPLANT
TOWEL OR 17X24 6PK STRL BLUE (TOWEL DISPOSABLE) ×2 IMPLANT
TOWEL OR 17X26 10 PK STRL BLUE (TOWEL DISPOSABLE) ×2 IMPLANT
TOWER CARTRIDGE SMART MIX (DISPOSABLE) ×2 IMPLANT
TRAY FOLEY CATH 14FR (SET/KITS/TRAYS/PACK) ×1 IMPLANT
WATER STERILE IRR 1000ML POUR (IV SOLUTION) ×7 IMPLANT

## 2013-01-01 NOTE — Preoperative (Signed)
Beta Blockers   Reason not to administer Beta Blockers:Not Applicable 

## 2013-01-01 NOTE — Anesthesia Postprocedure Evaluation (Signed)
Anesthesia Post Note  Patient: Diana Bowers  Procedure(s) Performed: Procedure(s) (LRB): COMPUTER ASSISTED TOTAL KNEE ARTHROPLASTY (Left)  Anesthesia type: general  Patient location: PACU  Post pain: Pain level controlled  Post assessment: Patient's Cardiovascular Status Stable  Last Vitals:  Filed Vitals:   01/01/13 1200  BP: 123/67  Pulse: 76  Temp:   Resp: 12    Post vital signs: Reviewed and stable  Level of consciousness: sedated  Complications: No apparent anesthesia complications

## 2013-01-01 NOTE — Progress Notes (Signed)
Orthopedic Tech Progress Note Patient Details:  Diana Bowers 04/25/1951 [id number]  CPM Left Knee CPM Left Knee: On Left Knee Flexion (Degrees): 60  Left Knee Extension (Degrees): 0  Additional Comments: trapeze bar    Cammer, Mickie Bail 01/01/2013, 11:55 AM

## 2013-01-01 NOTE — Evaluation (Signed)
Physical Therapy Evaluation Patient Details Name: Diana Bowers MRN: [id number] DOB: 30-Dec-1950 Today's Date: 01/01/2013 Time: 4098-1191 PT Time Calculation (min): 26 min  PT Assessment / Plan / Recommendation Clinical Impression  Patient is a 62 yo female s/p Lt. TKA.  Patient requiring mod assist for mobility.  Will benefit from acute PT to increase activiity tolerance and maximize independence prior to discharge to next level of care.  Recommend ST-SNF for continued therapy.    PT Assessment  Patient needs continued PT services    Follow Up Recommendations  SNF    Does the patient have the potential to tolerate intense rehabilitation      Barriers to Discharge        Equipment Recommendations  Rolling walker with 5" wheels    Recommendations for Other Services     Frequency 7X/week    Precautions / Restrictions Precautions Precautions: Knee Precaution Booklet Issued: Yes (comment) Precaution Comments: Reviewed precautions with patient Required Braces or Orthoses: Knee Immobilizer - Left Knee Immobilizer - Left: On except when in CPM;Discontinue once straight leg raise with < 10 degree lag Restrictions Weight Bearing Restrictions: Yes LLE Weight Bearing: Weight bearing as tolerated   Pertinent Vitals/Pain       Mobility  Bed Mobility Bed Mobility: Supine to Sit;Sitting - Scoot to Edge of Bed;Sit to Supine Supine to Sit: 3: Mod assist;With rails;HOB elevated Sitting - Scoot to Edge of Bed: 4: Min assist Sit to Supine: 3: Mod assist;HOB elevated Details for Bed Mobility Assistance: Verbal cues for technique.  Physical assist to move LLE off bed and to raise trunk off bed to sit.  Patient able to sit EOB x 10 minutes.  Mod assist to return to supine - to raise bil. LE's on to bed. Transfers Transfers: Not assessed       Exercises Total Joint Exercises Ankle Circles/Pumps: AROM;Both;10 reps;Seated   PT Diagnosis: Difficulty walking;Generalized weakness;Acute pain    PT Problem List: Decreased strength;Decreased range of motion;Decreased activity tolerance;Decreased balance;Decreased mobility;Decreased knowledge of use of DME;Decreased knowledge of precautions;Obesity;Pain PT Treatment Interventions: DME instruction;Gait training;Functional mobility training;Therapeutic exercise;Patient/family education   PT Goals Acute Rehab PT Goals PT Goal Formulation: With patient Time For Goal Achievement: 01/08/13 Potential to Achieve Goals: Good Pt will go Supine/Side to Sit: with supervision;with HOB 0 degrees PT Goal: Supine/Side to Sit - Progress: Goal set today Pt will go Sit to Supine/Side: with supervision;with HOB 0 degrees PT Goal: Sit to Supine/Side - Progress: Goal set today Pt will go Sit to Stand: with supervision;with upper extremity assist PT Goal: Sit to Stand - Progress: Goal set today Pt will go Stand to Sit: with supervision;with upper extremity assist PT Goal: Stand to Sit - Progress: Goal set today Pt will Ambulate: 51 - 150 feet;with supervision;with rolling walker PT Goal: Ambulate - Progress: Goal set today Pt will Perform Home Exercise Program: with supervision, verbal cues required/provided PT Goal: Perform Home Exercise Program - Progress: Goal set today  Visit Information  Last PT Received On: 01/01/13 Assistance Needed: +2    Subjective Data  Subjective: "I did pretty good" Patient Stated Goal: To decrease pain.  To walk   Prior Functioning  Home Living Lives With: Spouse;Son Available Help at Discharge: Skilled Nursing Facility [facility]) Home Adaptive Equipment: Straight cane Prior Function Level of Independence: Independent Able to Take Stairs?: Yes Driving: Yes Vocation: Full time employment Communication Communication: No difficulties    Cognition  Overall Cognitive Status: Appears within functional limits for  tasks assessed/performed Arousal/Alertness: Awake/alert Orientation Level: Appears intact for  tasks assessed Behavior During Session: [facility] for tasks performed    Extremity/Trunk Assessment Right Upper Extremity Assessment RUE ROM/Strength/Tone: WFL for tasks assessed RUE Sensation: WFL - Light Touch Left Upper Extremity Assessment LUE ROM/Strength/Tone: WFL for tasks assessed LUE Sensation: WFL - Light Touch Right Lower Extremity Assessment RLE ROM/Strength/Tone: WFL for tasks assessed RLE Sensation: WFL - Light Touch Left Lower Extremity Assessment LLE ROM/Strength/Tone: Deficits;Unable to fully assess;Due to pain LLE ROM/Strength/Tone Deficits: Decreased strength 3-/5; Decreased ROM   Balance    End of Session PT - End of Session Equipment Utilized During Treatment: Left knee immobilizer;Oxygen Activity Tolerance: Patient limited by fatigue;Patient limited by pain Patient left: in bed;with call bell/phone within reach;with family/visitor present Nurse Communication: Mobility status CPM Left Knee CPM Left Knee: Off  GP     Vena Austria 01/01/2013, 6:17 PM Durenda Hurt. Renaldo Fiddler, [facility] Acute Rehab Services Pager [phone]

## 2013-01-01 NOTE — Anesthesia Preprocedure Evaluation (Addendum)
Anesthesia Evaluation  Patient identified by MRN, date of birth, ID band Patient awake    Reviewed: Allergy & Precautions, H&P , NPO status , Patient's Chart, lab work & pertinent test results  History of Anesthesia Complications Negative for: history of anesthetic complications  Airway Mallampati: II TM Distance: >3 FB Neck ROM: Full    Dental  (+) Teeth Intact and Dental Advisory Given   Pulmonary neg pulmonary ROS,    Pulmonary exam normal       Cardiovascular hypertension,     Neuro/Psych negative neurological ROS  negative psych ROS   GI/Hepatic Neg liver ROS, GERD-  Medicated,  Endo/Other  diabetes  Renal/GU negative Renal ROS     Musculoskeletal   Abdominal   Peds  Hematology   Anesthesia Other Findings   Reproductive/Obstetrics                          Anesthesia Physical Anesthesia Plan  ASA: III  Anesthesia Plan: General   Post-op Pain Management:    Induction: Intravenous  Airway Management Planned: LMA  Additional Equipment:   Intra-op Plan:   Post-operative Plan: Extubation in OR  Informed Consent: I have reviewed the patients History and Physical, chart, labs and discussed the procedure including the risks, benefits and alternatives for the proposed anesthesia with the patient or authorized representative who has indicated his/her understanding and acceptance.   Dental advisory given  Plan Discussed with: CRNA, Anesthesiologist and Surgeon  Anesthesia Plan Comments:        Anesthesia Quick Evaluation

## 2013-01-01 NOTE — Brief Op Note (Signed)
01/01/2013  10:31 AM  PATIENT:  Diana Bowers  62 y.o. female  PRE-OPERATIVE DIAGNOSIS:  Severe osteoarthritis left knee  POST-OPERATIVE DIAGNOSIS:  Severe osteoarthritis left knee  PROCEDURE:  Procedure(s) (LRB) with comments: COMPUTER ASSISTED TOTAL KNEE ARTHROPLASTY (Left) - Left computer assisted total knee replacement Depuy Sigma Cemmented total knee arthroplasty with #4 femoral and #4 tibial components, 32mm patella component, And 10mm RTP poly insert   SURGEON:  Surgeon(s) and Role:   Kerrin Champagne, MD - Primary  PHYSICIAN ASSISTANT: Maud Deed, PA-C  ANESTHESIA:   regional, general and Left femoral nerve block. Dr. Krista Blue.  EBL:  Total I/O In: 1000 [I.V.:1000] Out: 400 [Urine:400]  BLOOD ADMINISTERED:none  DRAINS: (One) Hemovact drain(s) in the left anterior lateral knee. with  Suction Open and Urinary Catheter (Foley)   LOCAL MEDICATIONS USED:  NONE  SPECIMEN:  No Specimen  DISPOSITION OF SPECIMEN:  N/A  COUNTS:  YES  TOURNIQUET:   Total Tourniquet Time Documented: Thigh (Left) - 100 minutes  DICTATION: .Dragon Dictation  PLAN OF CARE: Admit to inpatient   PATIENT DISPOSITION:  PACU - hemodynamically stable.   Delay start of Pharmacological VTE agent (>24hrs) due to surgical blood loss or risk of bleeding: no

## 2013-01-01 NOTE — Op Note (Signed)
01/01/2013  10:35 AM  PATIENT:  Diana Bowers  62 y.o. female  MRN: [id number]  OPERATIVE REPORT   PRE-OPERATIVE DIAGNOSIS:  Severe osteoarthritis left knee  POST-OPERATIVE DIAGNOSIS:  Severe osteoarthritis left knee  PROCEDURE:  Procedure(s): COMPUTER ASSISTED TOTAL KNEE ARTHROPLASTYLeft computer assisted total knee replacement  Depuy Sigma Cemmented total knee arthroplasty with #4 femoral and #4 tibial components, 32mm patella component,  And 10mm RTP poly insert      SURGEON: NITKA,JAMES E, MD      ASSISTANT: Maud Deed, PA-C  (Present throughout the entire procedure and necessary for completion of procedure in a timely manner)      ANESTHESIA:  General with supplemental left femoral block. Dr. Krista Blue     COMPLICATIONS:  None.      COMPONENTS:  DePuy Sigma cemented total knee system.  A #4  femoral component.  A #4tibial tray with a 10mm polyethylene RP tibial spacer , a 32 mm polyethylene patella.     PROCEDURE:The patient was met in the holding area, and the appropriate left knee identified and marked with "X" and my initials. The patient received a preoperative femoral nerve block by anesthesia.  The patient was then transported to OR and was placed on the operative table in a supine position. The patient was then placed under general anesthesia without difficulty. The patient received appropriate preoperative antibiotic prophylaxis. The patien'ts left knee was examined under anesthesia and shown to have 7-130 range of motion,varus 5 degree deformity, ligamentously stable, and normal patella tracking. Nursing staff inserted a Foley catheter under sterile conditions.  Tourniquet was applied to the operative thigh. Leg was then prepped using sterile conditions and draped using sterile technique. Time-out procedure was called and correct left knee identified.  The leg was elevated and Esmarch exsanguinated with a thigh  tourniquet elevated ot 350 mmHg.  Initially, through a 15-cm  longitudinal incision based over the patella initial exposure was made. The underlying subcutaneous tissues were incised along with the skin incision. A median arthrotomy was performed revealing an excessive amount of normal-appearing joint fluid, The articular surfaces were inspected. The patient had grade 5 changes medially, grade 4 changes laterally, and grade 5 changes in the patellofemoral joint. Osteophytes were removed from the femoral condyles and the tibial plateau. The medial and lateral meniscal remnants were removed as well as the anterior cruciate ligament.  Pins were then placed in the anterior medial distal femur using the insertion guide and the femoral array placed. 2 pins were placed in the anterior lateral aspect of the mid tibia using the insertion guide through stab incisions. Tibial array was then placed. The computer infrared collector was then carefully aligned and the knee placed through a range of motion. The arrays were then carefully fixed in position and alignment. The hip axis of rotation determined without difficulty. Medial and lateral epicondyle of the ankle determined. Then the anterior cruciate ligament insertion point and points over the medial, lateral, anterior and posterior proximal tibia. The degree of rotation determined and then points collected for the medial and lateral tibial plateau surfaces. Attention then turned to the femur where collection points were obtained from the anterior distal femur the point just anterior to the femoral notch medial and lateral epicondyles and then painting of the anterior distal femur and medial and femoral condyles distally and posteriorly. Whitesides line was also determined. Anterior bow of the femur measured at 0. Based on this then the amount of proximal tibial cut was determined to  be in line with off the depressed side of the joint. Using the proximal tibial cutting jig with the verification guidepin the jig was pinned to the  proximal tibia within a millimeter and 1 of expected. 3 pins were used. Proximal tibial cut was then performed and verified. Soft tissue tensioning then examined and found to be well balanced in both flexion and extension. #4 femoral component chosen. The trial placed against the end of the femur and felt to be a good fit. Distal femur cutting jig was then carefully positioned on the distal femur using the verification the guide to within one degree and one millimeter of expected. 3 pins used in the jig in place and the distal transverse cut performed carefully protecting the soft tissue. Rotation guide was then attached after verification of the extension block at about 10 mm was again excellent tissue tensioning. Rotation guide was then attached and correct the alignment 2 pins placed over the distal cut surface of the femur for placement of the jig for the chamfer cuts and coronal cuts. This is a carefully placed and held in place with clamps. Protecting soft tissues then the anterior cut was made after first verifying with the wing depth of the cut. Then the posterior coronal cuts protect soft tissue structures posteriorly. Posterior chamfer cut and anterior chamfer cut. These were done without difficulty. Cutting guide removed and box cutting jig was then applied to the distal femur carefully aligned and pinned into place. Box cut was then performed from the distal femur intercondylar box. Proximal tibia was then subluxed the posterior cruciate resected. A #4 plate and attached to the transverse cut surface of the proximal tibia using 2 pins. The upright for the reamer was then applied and reaming carried out for the keel for the tibial component. Using the osteotome was then inserted and impacted into place the temporary fixation pins removed from the proximal tibial plate. Femoral component was inserted using a trial component. Then a 10 mm insert placed and the knee brought into full extension full  extension to 0 and 1.5 hyperextension was possible full flexion to 135 without difficulty or lift off. The components were accepted and the permanent #4 femoral and tibial components chosen and brought onto the field. Patella was observed to be a width anterior to posterior 21 mm and the residual 15 mm was allowed for the patella component. The cutting jig was then adjusted to allow for 15 mm remaining width. Applied and then the coronal cut the posterior aspect of the patella was performed. Lateral facet of patella showed very little resection. The trial 32 mm patella was chosen based on the size of the cut surface of patella. The 32 mm drill plate applied and drill holes placed through the posterior aspect of the patella. Trial reduction with the 32 mm trial and the leg placed through range of motion showed excellent motion full extension and flexion 135 patella tendency to elevate medially was clamped in place over the medial retinaculum patellar showed normal appearance with flexion extension. No shuck noted then the knee was stable to varus and valgus at 0 30 and 60. Expose were then removed and the knee was then irrigated with copious amounts of. Solution. A 32 mm permanent patella prosthesis also brought to the field. Cement was mixed the knee was subluxed and carefully soft tissues protected note that the lug holes in the distal femur where drill using the trial prosthesis and with the drill guide provided. With  irrigation completed and permanent tibial components were then inserted into place using cement and a semi-putty state over the proximal aspect of the tibia cement was also applied to the deep surface of the tibial component as well as over the posterior runners of the femur and the deep surface of the patella component. These were then carefully place excess cement removed about the circumference of the tibial tray the femur was then placed applying cement to the cut it distal aspect of the  femur and posterior chamfer area anterior chamfer and anterior coronal cut surface small amount within the box. Femoral component was then impacted into place excess cement removed amounts of circumference including the posterior chamfer area and the posterior femoral condyle area. The trial tibial peg then placed in the tibial component and a 10 mm rotating platform trial was inserted knee brought into full extension observed on the appeared to be in 1 of varus with full extension 1 of hyperextension. Cement was then applied to the posterior cut surface of the patella within each of the patella peg opening was then pin holes were placed for the cementing along the lateral aspect of the patella. Patella component was then carefully aligned and inserted over the posterior aspect of the patella and clamped in place excess cement was then resected circumferentially using scalpel in order for her to preserve cement the lateral facet area. Cement was allowed to fully harden tourniquet was released while cement was nearly completely hardened. Following this then irrigation was carried down careful inspection demonstrated some small bleeders present over the lateral posterior aspect of the knee joint cauterized using electrocautery off to the medial aspect and the areas of geniculate arteries. There is no active bleeding present then at that time. Trial 10 mm tibial insert demonstrated stability of the anterior drawer and negative and the knee stable to varus valgus stress and 30 and 60 flexion and full extension. A 10 mm rotating platform insert was then chosen this was applied to the tibia using the permanent implant removing the trial examining tibia and determined there was no evident residual cement remaining. Also examined posterior runners I posterior aspect of the femoral condyles for any residual cement none was present. The tibial insert in place the reduced placed through range of motion and full extension  flexion and 30 knee stable to varus stress at 0 30 and 60 anterior drawer negative. This component was accepted. A Hemovac drain was placed in the depths the incision and the patient then had the synovium reapproximated over the medial aspect of the knee with 0 Vicryl sutures.  The retinaculum of the knee and the quadriceps tendon were then reapproximated with interrupted #1 Vicryl sutures. Peritenon of the reapproximated with interrupted 0 Vicryl sutures deep subcutaneous layers approximated with interrupted 0 and 2-0 Vicryl sutures and the skin closed with a running subcutaneous stitch of 4-0 Vicryl. Dermabond was then applied.Adaptic 4 x 4's ABDs pads fixed to the skin with sterile labrum an Ace wrap applied from the foot to the right upper thigh and knee immobilizer. All instrument and sponge counts were correct. Then reactivated extubated and returned to recovery room in satisfactory condition.  Maud Deed PA-C perform the duties of assistant surgeon she performed careful retraction of soft tissues assisted in addition the patient had removal on the OR table is present beginning the case the case and performed closure of the incision from the peritenon to the skin and application of dressing.    NITKA,JAMES  E 01/01/2013, 10:35 AM

## 2013-01-01 NOTE — Anesthesia Procedure Notes (Signed)
Anesthesia Regional Block:  Femoral nerve block  Pre-Anesthetic Checklist: ,, timeout performed, Correct Patient, Correct Site, Correct Laterality, Correct Procedure, Correct Position, site marked, Risks and benefits discussed,  Surgical consent,  Pre-op evaluation,  At surgeon's request and post-op pain management  Laterality: Left  Prep: chloraprep       Needles:  Injection technique: Single-shot  Needle Type: Echogenic Stimulator Needle     Needle Length: 5cm 5 cm Needle Gauge: 22 and 22 G    Additional Needles:  Procedures: ultrasound guided (picture in chart) and nerve stimulator Femoral nerve block  Nerve Stimulator or Paresthesia:  Response: quadraceps contraction, 0.45 mA,   Additional Responses:   Narrative:  Start time: 01/01/2013 7:16 AM End time: 01/01/2013 7:26 AM Injection made incrementally with aspirations every 5 mL.  Performed by: Personally  Anesthesiologist: Halford Decamp, MD  Additional Notes: Functioning IV was confirmed and monitors were applied.  A 50mm 22ga Arrow echogenic stimulator needle was used. Sterile prep and drape,hand hygiene and sterile gloves were used. Ultrasound guidance: relevant anatomy identified, needle position confirmed, local anesthetic spread visualized around nerve(s)., vascular puncture avoided.  Image printed for medical record. Negative aspiration and negative test dose prior to incremental administration of local anesthetic. The patient tolerated the procedure well.    Femoral nerve block

## 2013-01-01 NOTE — Transfer of Care (Signed)
Immediate Anesthesia Transfer of Care Note  Patient: Diana Bowers  Procedure(s) Performed: Procedure(s) (LRB) with comments: COMPUTER ASSISTED TOTAL KNEE ARTHROPLASTY (Left) - Left computer assisted total knee replacement  Patient Location: PACU  Anesthesia Type:General  Level of Consciousness: awake, alert  and oriented  Airway & Oxygen Therapy: Patient Spontanous Breathing and Patient connected to nasal cannula oxygen  Post-op Assessment: Report given to PACU RN, Post -op Vital signs reviewed and stable and Patient moving all extremities  Post vital signs: Reviewed and stable  Complications: No apparent anesthesia complications

## 2013-01-02 ENCOUNTER — Encounter (HOSPITAL_COMMUNITY): Payer: Self-pay | Admitting: Specialist

## 2013-01-02 LAB — URINE CULTURE

## 2013-01-02 LAB — CBC
MCH: 25.6 pg — ABNORMAL LOW (ref 26.0–34.0)
MCHC: 31.7 g/dL (ref 30.0–36.0)
MCV: 80.9 fL (ref 78.0–100.0)
Platelets: 171 10*3/uL (ref 150–400)
RBC: 3.71 MIL/uL — ABNORMAL LOW (ref 3.87–5.11)
RDW: 14.2 % (ref 11.5–15.5)

## 2013-01-02 LAB — BASIC METABOLIC PANEL
CO2: 26 mEq/L (ref 19–32)
Calcium: 8.3 mg/dL — ABNORMAL LOW (ref 8.4–10.5)
Creatinine, Ser: 1.45 mg/dL — ABNORMAL HIGH (ref 0.50–1.10)
GFR calc non Af Amer: 39 mL/min — ABNORMAL LOW (ref >90)
Sodium: 139 mEq/L (ref 135–145)

## 2013-01-02 LAB — GLUCOSE, CAPILLARY
Glucose-Capillary: 172 mg/dL — ABNORMAL HIGH (ref 70–99)
Glucose-Capillary: 142 mg/dL — ABNORMAL HIGH (ref 70–99)

## 2013-01-02 MED ORDER — METHOCARBAMOL 100 MG/ML IJ SOLN
500.0000 mg | Freq: Four times a day (QID) | INTRAVENOUS | Status: DC | PRN
  Filled 2013-01-02: qty 5

## 2013-01-02 MED ORDER — METHOCARBAMOL 500 MG PO TABS
500.0000 mg | ORAL_TABLET | Freq: Four times a day (QID) | ORAL | Status: DC | PRN
  Administered 2013-01-02 – 2013-01-04 (×4): 500 mg via ORAL
  Filled 2013-01-02 (×4): qty 1

## 2013-01-02 NOTE — Progress Notes (Addendum)
Subjective: 1 Day Post-Op Procedure(s) (LRB): COMPUTER ASSISTED TOTAL KNEE ARTHROPLASTY (Left) Patient reports pain as mild.   Pain well controlled.  Not using PCA. Got up yesterday and up this am at bedside bathing. Objective: Vital signs in last 24 hours: Temp:  [96.8 F (36 C)-98.9 F (37.2 C)] 97.1 F (36.2 C) (01/07 0713) Pulse Rate:  [76-89] 82  (01/07 0713) Resp:  [12-25] 18  (01/07 0753) BP: (115-136)/(65-82) 130/78 mmHg (01/07 0713) SpO2:  [93 %-99 %] 99 % (01/07 0752)  Intake/Output from previous day: 01/06 0701 - 01/07 0700 In: 2000 [I.V.:2000] Out: 3100 [Urine:2650; Drains:200; Blood:250] Intake/Output this shift:     Basename 01/02/13 0525  HGB 9.5*    Basename 01/02/13 0525  WBC 8.1  RBC 3.71*  HCT 30.0*  PLT 171    Basename 01/02/13 0525  NA 139  K 5.0  CL 105  CO2 26  BUN 22  CREATININE 1.45*  GLUCOSE 101*  CALCIUM 8.3*   No results found for this basename: LABPT:2,INR:2 in the last 72 hours  Neurovascular intact Intact pulses distally Incision: dressing dry.  hemovac removed.  Assessment/Plan: 1 Day Post-Op Procedure(s) (LRB): COMPUTER ASSISTED TOTAL KNEE ARTHROPLASTY (Left) Advance diet Up with therapy D/C IV fluids Discharge to SNF.  Pt request [employer]. DC PCA Robaxin ordered to decrease muscle spasm.  VERNON,SHEILA M 01/02/2013, 8:59 AM  Discontinued celebrex due to elevated Creatine 1.43. No further toradol.

## 2013-01-02 NOTE — Clinical Social Work Psychosocial (Signed)
Clinical Social Work Department  BRIEF PSYCHOSOCIAL ASSESSMENT  Patient: Diana Bowers Account Number: [id number] Admit date: 01/01/2013  Clinical Social Worker:  Sabino Niemann, MSW  Date/Time: 01/02/2013  Referred by: Physician Date Referred:01/01/2013  Referred for   Diana Bowers Placement   Other Referral:  Interview type: Patient  Other interview type: PSYCHOSOCIAL DATA  Living Status:  With spouse Admitted from facility:  Level of care:  Primary support name: Diana Bowers  Primary support relationship to patient: husband  Degree of support available:  Strong and vested   CURRENT CONCERNS  Current Concerns   Post-Acute Placement   Other Concerns:  SOCIAL WORK ASSESSMENT / Bowers  CSW met with pt re: PT recommendation for Diana Bowers.   Pt lives with her husband   CSW explained placement process and answered questions.   Pt reports Diana Bowers is her first preference and Diana Bowers is her second choice   CSW completed FL2 and initiated Diana Bowers search.   Weekday CSW to f/u with offers.   Assessment/Bowers status:  Information/Referral to Diana Bowers: N/A  Other assessment/ Bowers:  Information/referral to community resources:  Diana Bowers   Diana Bowers   PATIENT'S/FAMILY'S RESPONSE TO Bowers OF CARE:  Pt is agreeable to [facility] Bow[facility] in order to increase strength and independence with mobility prior to return home. Pt verbalized understanding of placement process and appreciation for CSW assist.   Sabino Niemann, MSW  [phone]

## 2013-01-02 NOTE — Progress Notes (Signed)
UR COMPLETED  

## 2013-01-02 NOTE — Progress Notes (Signed)
Physical Therapy Treatment Patient Details Name: Diana Bowers MRN: [id number] DOB: 06-Apr-1951 Today's Date: 01/02/2013 Time: 4098-1191 PT Time Calculation (min): 28 min  PT Assessment / Plan / Recommendation Comments on Treatment Session  Patient continues to be highly motivated and is progressing well with therapy. Continue with current POC    Follow Up Recommendations  SNF     Does the patient have the potential to tolerate intense rehabilitation     Barriers to Discharge        Equipment Recommendations  Rolling walker with 5" wheels    Recommendations for Other Services    Frequency 7X/week   Plan Discharge plan remains appropriate;Frequency remains appropriate    Precautions / Restrictions Precautions Precautions: Knee Precaution Booklet Issued: Yes (comment) Required Braces or Orthoses: Knee Immobilizer - Left Knee Immobilizer - Left: On except when in CPM;Discontinue once straight leg raise with < 10 degree lag Restrictions LLE Weight Bearing: Weight bearing as tolerated   Pertinent Vitals/Pain     Mobility  Bed Mobility Bed Mobility: Not assessed Details for Bed Mobility Assistance: Patient sitting EOB upon arrival Transfers Transfers: Sit to Stand;Stand to Sit Sit to Stand: 4: Min assist;With upper extremity assist;From chair/3-in-1;From toilet Stand to Sit: To toilet;To chair/3-in-1;With upper extremity assist;4: Min guard Details for Transfer Assistance: A to ensure safety with stand and for balance. Cues for best technique and positioning prior to sitting Ambulation/Gait Ambulation/Gait Assistance: 4: Min guard Ambulation Distance (Feet): 120 Feet Assistive device: Rolling walker Ambulation/Gait Assistance Details: Cues for positioning within RW. Cues for posture.  Gait Pattern: Step-to pattern;Decreased step length - right;Decreased step length - left Gait velocity: decreased    Exercises Total Joint Exercises Ankle Circles/Pumps: AROM;Both;10  reps Quad Sets: AROM;Left;10 reps Heel Slides: AAROM;Left;10 reps Hip ABduction/ADduction: AAROM;Left;10 reps   PT Diagnosis:    PT Problem List:   PT Treatment Interventions:     PT Goals Acute Rehab PT Goals PT Goal: Sit to Stand - Progress: Progressing toward goal PT Goal: Stand to Sit - Progress: Progressing toward goal PT Goal: Ambulate - Progress: Progressing toward goal PT Goal: Perform Home Exercise Program - Progress: Progressing toward goal  Visit Information  Last PT Received On: 01/02/13 Assistance Needed: +1    Subjective Data      Cognition  Overall Cognitive Status: Appears within functional limits for tasks assessed/performed Arousal/Alertness: Awake/alert Orientation Level: Appears intact for tasks assessed Behavior During Session: [facility] for tasks performed    Balance     End of Session PT - End of Session Equipment Utilized During Treatment: Gait belt;Left knee immobilizer Activity Tolerance: Patient tolerated treatment well Patient left: in chair;with call bell/phone within reach Nurse Communication: Mobility status   GP     Fredrich Birks 01/02/2013, 2:48 PM  01/02/2013 Fredrich Birks PTA [phone] pager [phone] office

## 2013-01-02 NOTE — Progress Notes (Signed)
Physical Therapy Treatment Patient Details Name: ARANTZA DARRINGTON MRN: [id number] DOB: 1951/02/18 Today's Date: 01/02/2013 Time: 7829-5621 PT Time Calculation (min): 31 min  PT Assessment / Plan / Recommendation Comments on Treatment Session  Patient progressing well with therapy this morning. Patient motivated. States doesn't have assistance at discharge as husband is having to work. Patient is set up to go to [facility] at discharge.     Follow Up Recommendations  SNF     Does the patient have the potential to tolerate intense rehabilitation     Barriers to Discharge        Equipment Recommendations       Recommendations for Other Services    Frequency 7X/week   Plan Discharge plan remains appropriate;Frequency remains appropriate    Precautions / Restrictions Precautions Precautions: Knee Required Braces or Orthoses: Knee Immobilizer - Left Knee Immobilizer - Left: On except when in CPM;Discontinue once straight leg raise with < 10 degree lag Restrictions LLE Weight Bearing: Weight bearing as tolerated   Pertinent Vitals/Pain     Mobility  Bed Mobility Bed Mobility: Not assessed Details for Bed Mobility Assistance: Patient sitting EOB upon arrival Transfers Transfers: Sit to Stand;Stand to Sit Sit to Stand: 4: Min assist;With upper extremity assist;From toilet;From chair/3-in-1 Stand to Sit: 4: Min assist;With upper extremity assist;To chair/3-in-1;To toilet Details for Transfer Assistance: A to initiate stand and to ensure balance. Cues for safe hand placement and positioning with transfer Ambulation/Gait Ambulation/Gait Assistance: 4: Min guard Ambulation Distance (Feet): 80 Feet Assistive device: Rolling walker Ambulation/Gait Assistance Details: Cues for sequency/technique with RW. Cues for posture and heel strike Gait Pattern: Step-to pattern;Decreased step length - right;Decreased step length - left;Decreased stride length Gait velocity: decreased    Exercises  Total Joint Exercises Ankle Circles/Pumps: AROM;Both;10 reps Quad Sets: AROM;Left;10 reps Heel Slides: AAROM;Left;10 reps   PT Diagnosis:    PT Problem List:   PT Treatment Interventions:     PT Goals    Visit Information  Last PT Received On: 01/02/13 Assistance Needed: +1    Subjective Data      Cognition  Overall Cognitive Status: Appears within functional limits for tasks assessed/performed Arousal/Alertness: Awake/alert Orientation Level: Appears intact for tasks assessed Behavior During Session: [facility] for tasks performed    Balance     End of Session PT - End of Session Equipment Utilized During Treatment: Gait belt Activity Tolerance: Patient tolerated treatment well Patient left: in chair;with call bell/phone within reach Nurse Communication: Mobility status   GP     Fredrich Birks 01/02/2013, 11:19 AM 01/02/2013 Fredrich Birks PTA [phone] pager [phone] office

## 2013-01-03 LAB — CBC
Hemoglobin: 8.8 g/dL — ABNORMAL LOW (ref 12.0–15.0)
MCH: 25.4 pg — ABNORMAL LOW (ref 26.0–34.0)
MCHC: 31.5 g/dL (ref 30.0–36.0)
MCV: 80.6 fL (ref 78.0–100.0)
Platelets: 148 10*3/uL — ABNORMAL LOW (ref 150–400)
RBC: 3.46 MIL/uL — ABNORMAL LOW (ref 3.87–5.11)

## 2013-01-03 NOTE — Progress Notes (Signed)
Physical Therapy Treatment Patient Details Name: Diana Bowers MRN: [id number] DOB: [birth year] Today's Date: 01/03/2013 Time: 7829-5621 PT Time Calculation (min): 24 min  PT Assessment / Plan / Recommendation Comments on Treatment Session  Patient progressing well. Awaiting SNF placement as she does not have daily assistance at home. Will continue with current POC    Follow Up Recommendations  SNF     Does the patient have the potential to tolerate intense rehabilitation     Barriers to Discharge        Equipment Recommendations  Rolling walker with 5" wheels    Recommendations for Other Services    Frequency 7X/week   Plan Discharge plan remains appropriate;Frequency remains appropriate    Precautions / Restrictions Precautions Precautions: Knee Precaution Booklet Issued: Yes (comment) Required Braces or Orthoses: Knee Immobilizer - Left Knee Immobilizer - Left: On except when in CPM;Discontinue once straight leg raise with < 10 degree lag Restrictions LLE Weight Bearing: Weight bearing as tolerated   Pertinent Vitals/Pain     Mobility  Bed Mobility Bed Mobility: Not assessed Transfers Sit to Stand: 5: Supervision;With upper extremity assist;From chair/3-in-1;From toilet Stand to Sit: 5: Supervision;With upper extremity assist;To chair/3-in-1;To toilet Details for Transfer Assistance: Good technique and safety Ambulation/Gait Ambulation/Gait Assistance: 4: Min guard Ambulation Distance (Feet): 140 Feet Assistive device: Rolling walker Ambulation/Gait Assistance Details: Cues for posture and positioning inside of RW. Patient tends to lean over top of RW Gait Pattern: Step-to pattern;Trunk flexed    Exercises Total Joint Exercises Ankle Circles/Pumps: AROM;Left;15 reps Quad Sets: AROM;Left;15 reps Short Arc QuadBarbaraann Boys;Left;15 reps Heel Slides: AAROM;Left;15 reps Hip ABduction/ADduction: AAROM;Left;15 reps Straight Leg Raises: AAROM;Left;15 reps   PT Diagnosis:      PT Problem List:   PT Treatment Interventions:     PT Goals Acute Rehab PT Goals PT Goal: Sit to Stand - Progress: Progressing toward goal PT Goal: Stand to Sit - Progress: Progressing toward goal PT Goal: Ambulate - Progress: Progressing toward goal PT Goal: Perform Home Exercise Program - Progress: Progressing toward goal  Visit Information  Last PT Received On: 01/03/13 Assistance Needed: +1    Subjective Data      Cognition  Overall Cognitive Status: Appears within functional limits for tasks assessed/performed Arousal/Alertness: Awake/alert Orientation Level: Appears intact for tasks assessed Behavior During Session: [facility] for tasks performed    Balance     End of Session PT - End of Session Equipment Utilized During Treatment: Gait belt;Left knee immobilizer Activity Tolerance: Patient tolerated treatment well Patient left: in chair;with call bell/phone within reach Nurse Communication: Mobility status   GP     Fredrich Birks 01/03/2013, 11:11 AM 01/03/2013 Fredrich Birks PTA [phone] pager [phone] office

## 2013-01-03 NOTE — Progress Notes (Signed)
Subjective: 2 Days Post-Op Procedure(s) (LRB): COMPUTER ASSISTED TOTAL KNEE ARTHROPLASTY (Left) Patient reports pain as moderate.    Objective: Vital signs in last 24 hours: Temp:  [98.7 F (37.1 C)-101.4 F (38.6 C)] 99.1 F (37.3 C) (01/08 0619) Pulse Rate:  [70-92] 92  (01/08 0619) Resp:  [16-18] 16  (01/08 0619) BP: (102-105)/(52-56) 102/56 mmHg (01/08 0619) SpO2:  [93 %-98 %] 93 % (01/08 0619)  Intake/Output from previous day: 01/07 0701 - 01/08 0700 In: 900 [P.O.:900] Out: 400 [Urine:400] Intake/Output this shift:     Basename 01/03/13 0616 01/02/13 0525  HGB 8.8* 9.5*    Basename 01/03/13 0616 01/02/13 0525  WBC 10.1 8.1  RBC 3.46* 3.71*  HCT 27.9* 30.0*  PLT 148* 171    Basename 01/02/13 0525  NA 139  K 5.0  CL 105  CO2 26  BUN 22  CREATININE 1.45*  GLUCOSE 101*  CALCIUM 8.3*   No results found for this basename: LABPT:2,INR:2 in the last 72 hours  Neurovascular intact Intact pulses distally Dorsiflexion/Plantar flexion intact Incision: no drainage  Assessment/Plan: 2 Days Post-Op Procedure(s) (LRB): COMPUTER ASSISTED TOTAL KNEE ARTHROPLASTY (Left) Up with therapy Discharge to SNF tomorrow if bed available at [facility]  VERNON,SHEILA M 01/03/2013, 9:16 AM

## 2013-01-03 NOTE — Progress Notes (Signed)
Physical Therapy Treatment Patient Details Name: Diana Bowers MRN: [id number] DOB: Jul 14, 1951 Today's Date: 01/03/2013 Time: 4098-1191 PT Time Calculation (min): 24 min  PT Assessment / Plan / Recommendation Comments on Treatment Session  Patient progressing well with ambulation quality. Continue with current POC    Follow Up Recommendations  SNF     Does the patient have the potential to tolerate intense rehabilitation     Barriers to Discharge        Equipment Recommendations  Rolling walker with 5" wheels    Recommendations for Other Services    Frequency 7X/week   Plan Discharge plan remains appropriate;Frequency remains appropriate    Precautions / Restrictions Precautions Precautions: Knee Required Braces or Orthoses: Knee Immobilizer - Left Knee Immobilizer - Left: On except when in CPM;Discontinue once straight leg raise with < 10 degree lag Restrictions LLE Weight Bearing: Weight bearing as tolerated   Pertinent Vitals/Pain 2/10 L knee pain.    Mobility  Bed Mobility Bed Mobility: Not assessed Transfers Sit to Stand: 5: Supervision;From chair/3-in-1;From toilet Stand to Sit: 5: Supervision;To chair/3-in-1;To toilet Details for Transfer Assistance: Good technique and safety Ambulation/Gait Ambulation/Gait Assistance: 4: Min guard Ambulation Distance (Feet): 140 Feet Assistive device: Rolling walker Ambulation/Gait Assistance Details: Cues for posture and to increase step length. Patient starting to ambulate with step through pattern Gait Pattern: Step-to pattern;Step-through pattern;Decreased step length - left;Decreased stance time - right;Decreased stride length;Trunk flexed    Exercises Total Joint Exercises Ankle Circles/Pumps: AROM;Left;15 reps Quad Sets: AROM;Left;15 reps Short Arc QuadBarbaraann Boys;Left;15 reps Heel Slides: AAROM;Left;15 reps Hip ABduction/ADduction: AAROM;Left;15 reps Straight Leg Raises: AAROM;Left;15 reps   PT Diagnosis:    PT  Problem List:   PT Treatment Interventions:     PT Goals Acute Rehab PT Goals PT Goal: Sit to Stand - Progress: Met PT Goal: Stand to Sit - Progress: Met PT Goal: Ambulate - Progress: Progressing toward goal PT Goal: Perform Home Exercise Program - Progress: Progressing toward goal  Visit Information  Last PT Received On: 01/03/13 Assistance Needed: +1    Subjective Data      Cognition  Overall Cognitive Status: Appears within functional limits for tasks assessed/performed Arousal/Alertness: Awake/alert Orientation Level: Appears intact for tasks assessed Behavior During Session: [facility] for tasks performed    Balance     End of Session PT - End of Session Equipment Utilized During Treatment: Gait belt Activity Tolerance: Patient tolerated treatment well Patient left: in chair;with call bell/phone within reach Nurse Communication: Mobility status   GP     Fredrich Birks 01/03/2013, 2:37 PM 01/03/2013 Fredrich Birks PTA [phone] pager [phone] office

## 2013-01-03 NOTE — Progress Notes (Signed)
CARE MANAGEMENT NOTE 01/03/2013  Patient:  Diana Bowers,Diana Bowers   Account Number:  [id number]  Date Initiated:  01/01/2013  Documentation initiated by:  Vance Peper  Subjective/Objective Assessment:   62 yr old female s/p left total knee arthroplasty.     Action/Plan:   Patient is for shortterm rehab at [facility]. Will be going to [facility]. Social Worker is aware.   Anticipated DC Date:  01/04/2013   Anticipated DC Plan:  SKILLED NURSING FACILITY  In-house referral  Clinical Social Worker      DC Planning Services  CM consult      Choice offered to / List presented to:             Status of service:  Completed, signed off Medicare Important Message given?   (If response is "NO", the following Medicare IM given date fields will be blank) Date Medicare IM given:   Date Additional Medicare IM given:    Discharge Disposition:    Per UR Regulation:    If discussed at Long Length of Stay Meetings, dates discussed:    Comments:

## 2013-01-03 NOTE — Evaluation (Signed)
Occupational Therapy Evaluation Patient Details Name: Diana Bowers MRN: [id number] DOB: 29-Sep-1951 Today's Date: 01/03/2013 Time: 4098-1191 OT Time Calculation (min): 26 min  OT Assessment / Plan / Recommendation Clinical Impression  This 62 year old female was admitted for L TKA.  She will benefit from skilled OT to increase independence with adls; goals in acute are supervision to mod I level.     OT Assessment  Patient needs continued OT Services    Follow Up Recommendations  SNF--pt is alone during day and needs to be mod I    Barriers to Discharge      Equipment Recommendations  3 in 1 bedside comode    Recommendations for Other Services    Frequency  Min 2X/week    Precautions / Restrictions Precautions Precautions: Knee Precaution Booklet Issued: Yes (comment) Required Braces or Orthoses: Knee Immobilizer - Left Knee Immobilizer - Left: On except when in CPM;Discontinue once straight leg raise with < 10 degree lag Restrictions LLE Weight Bearing: Weight bearing as tolerated   Pertinent Vitals/Pain No c/o pain    ADL  Grooming: Performed;Teeth care;Supervision/safety Where Assessed - Grooming: Supported standing Upper Body Bathing: Performed;Supervision/safety Where Assessed - Upper Body Bathing: Supported standing Lower Body Bathing: Performed;Moderate assistance Where Assessed - Lower Body Bathing: Supported sit to stand Upper Body Dressing: Performed;Supervision/safety Where Assessed - Upper Body Dressing: Supported standing Lower Body Dressing: Performed;Moderate assistance (with AE:  used sock aid) Where Assessed - Lower Body Dressing: Supported sit to Pharmacist, hospital: Performed;Supervision/safety (cues for hand placement) Toilet Transfer Method: Sit to Barista: Raised toilet seat with arms (or 3-in-1 over toilet) Toileting - Clothing Manipulation and Hygiene: Performed;Supervision/safety Where Assessed - Toileting Clothing  Manipulation and Hygiene: Sit to stand from 3-in-1 or toilet Equipment Used: Rolling walker;Sock aid;Reacher Transfers/Ambulation Related to ADLs: ambulated to bathroom ADL Comments: pt got a Sports administrator for Christmas; reviewed adl uses.  she has not used it yet.  Introduced sock aid    OT Diagnosis: Generalized weakness  OT Problem List: Decreased strength;Decreased knowledge of use of DME or AE;Decreased range of motion;Decreased activity tolerance OT Treatment Interventions: Patient/family education;Self-care/ADL training;DME and/or AE instruction;Therapeutic activities   OT Goals Acute Rehab OT Goals OT Goal Formulation: With patient Time For Goal Achievement: 01/10/13 Potential to Achieve Goals: Good ADL Goals Pt Will Transfer to Toilet: with modified independence;Ambulation;3-in-1 (and complete all aspects of toileting) ADL Goal: Toilet Transfer - Progress: Goal set today Pt Will Perform Tub/Shower Transfer: Shower transfer;Ambulation;with supervision (3:1) ADL Goal: Tub/Shower Transfer - Progress: Goal set today Miscellaneous OT Goals Miscellaneous OT Goal #1: Pt will gather clothes at a supervision level and complete adls using AE with distant supervision, no more than 1 vc  OT Goal: Miscellaneous Goal #1 - Progress: Goal set today  Visit Information  Last OT Received On: 01/03/13 Assistance Needed: +1    Subjective Data  Subjective: I haven't washed up yet Patient Stated Goal: [facility] then home; she wants to be independent at home   Prior Functioning     Home Living Lives With: Spouse;Son Available Help at Discharge: Skilled Nursing Facility Bathroom Shower/Tub: Walk-in shower Bathroom Toilet: Standard Home Adaptive Equipment: Straight cane Prior Function Level of Independence: Independent Driving: Yes Vocation: Full time employment Communication Communication: No difficulties Dominant Hand: Right         Vision/Perception     Cognition  Overall  Cognitive Status: Appears within functional limits for tasks assessed/performed Arousal/Alertness: Awake/alert Orientation Level: Appears intact  for tasks assessed Behavior During Session: [facility] for tasks performed    Extremity/Trunk Assessment Right Upper Extremity Assessment RUE ROM/Strength/Tone: [facility] for tasks assessed Left Upper Extremity Assessment LUE ROM/Strength/Tone: [facility] for tasks assessed     Mobility Transfers Sit to Stand: 5: Supervision;From chair/3-in-1;With armrests;With upper extremity assist Details for Transfer Assistance: min cues for hand placement     Shoulder Instructions     Exercise     Balance     End of Session OT - End of Session Activity Tolerance: Patient tolerated treatment well Patient left: in chair;with call bell/phone within reach  GO     [facility] 01/03/2013, 9:06 AM Marica Otter, OTR/L [phone] 01/03/2013

## 2013-01-04 DIAGNOSIS — D62 Acute posthemorrhagic anemia: Secondary | ICD-10-CM | POA: Diagnosis not present

## 2013-01-04 LAB — CBC
HCT: 25.1 % — ABNORMAL LOW (ref 36.0–46.0)
MCV: 80.4 fL (ref 78.0–100.0)
RBC: 3.12 MIL/uL — ABNORMAL LOW (ref 3.87–5.11)
WBC: 10.2 10*3/uL (ref 4.0–10.5)

## 2013-01-04 LAB — GLUCOSE, CAPILLARY: Glucose-Capillary: 113 mg/dL — ABNORMAL HIGH (ref 70–99)

## 2013-01-04 MED ORDER — RIVAROXABAN 10 MG PO TABS: 10.0000 mg | ORAL_TABLET | Freq: Every day | ORAL | Status: DC

## 2013-01-04 MED ORDER — METHOCARBAMOL 500 MG PO TABS: 500.0000 mg | ORAL_TABLET | Freq: Four times a day (QID) | ORAL | Status: DC | PRN

## 2013-01-04 MED ORDER — OXYCODONE HCL 5 MG PO TABS: ORAL_TABLET | ORAL | Status: DC

## 2013-01-04 NOTE — Discharge Summary (Signed)
Physician Discharge Summary  Patient ID: Diana Bowers MRN: [id number] DOB/AGE: [birth year] 62 y.o.  Admit date: 01/01/2013 Discharge date: 01/04/2013  Admission Diagnoses:  Osteoarthritis of left knee  Discharge Diagnoses:  Principal Problem:  *Osteoarthritis of left knee Active Problems:  Acute blood loss anemia   Past Medical History  Diagnosis Date  . Hypertension   . GERD (gastroesophageal reflux disease)   . Hypercholesteremia   . DVT of lower extremity (deep venous thrombosis) 2007    "LLE; from birth control pill" (01/01/2013)  . Diabetes mellitus without complication     borderline no med  . Arthritis     "left knee" (01/01/2013)    Surgeries: Procedure(s): COMPUTER ASSISTED LEFT TOTAL KNEE ARTHROPLASTY on 01/01/2013   Consultants (if any):  NONE  Discharged Condition: Improved  Hospital Course: Diana Bowers is an 62 y.o. female who was admitted 01/01/2013 with a diagnosis of Osteoarthritis of left knee and went to the operating room on 01/01/2013 and underwent the above named procedures.    She was given perioperative antibiotics:  Anti-infectives     Start     Dose/Rate Route Frequency Ordered Stop   12/31/12 1353   vancomycin (VANCOCIN) 1,500 mg in sodium chloride 0.9 % 500 mL IVPB        1,500 mg 250 mL/hr over 120 Minutes Intravenous 60 min pre-op 12/31/12 1353 01/01/13 0745        .  She was given sequential compression devices, early ambulation, and XARELTO for DVT prophylaxis. Pt with ABL anemia which was monitored and treated with iron supplement.  Pt was asymptomatic.  Tolerated po analgesics well.  CPM used daily.  Pt progressed will with activity. Due to lack of assistance at home pt needed short term NHP for rehab before returning home independently. She benefited maximally from the hospital stay and there were no complications.    Recent vital signs:  Filed Vitals:   01/04/13 0714  BP: 120/64  Pulse: 95  Temp: 98.2 F (36.8 C)  Resp: 20    Recent  laboratory studies:  Lab Results  Component Value Date   HGB 8.1* 01/04/2013   HGB 8.8* 01/03/2013   HGB 9.5* 01/02/2013   Lab Results  Component Value Date   WBC 10.2 01/04/2013   PLT 160 01/04/2013   Lab Results  Component Value Date   INR 0.99 12/25/2012   Lab Results  Component Value Date   NA 139 01/02/2013   K 5.0 01/02/2013   CL 105 01/02/2013   CO2 26 01/02/2013   BUN 22 01/02/2013   CREATININE 1.45* 01/02/2013   GLUCOSE 101* 01/02/2013    Discharge Medications:     Medication List     As of 01/04/2013  8:36 AM    TAKE these medications         CALCIUM PO   Take 1 tablet by mouth daily.      diltiazem 360 MG 24 hr capsule   Commonly known as: CARDIZEM CD   Take 360 mg by mouth daily.      LIVALO 2 MG Tabs   Generic drug: Pitavastatin Calcium   Take 2 tablets by mouth daily.      methocarbamol 500 MG tablet   Commonly known as: ROBAXIN   Take 1 tablet (500 mg total) by mouth every 6 (six) hours as needed.      oxyCODONE 5 MG immediate release tablet   Commonly known as: Oxy IR/ROXICODONE   1-2 q4-6 hr  prn pain      RABEprazole 20 MG tablet   Commonly known as: ACIPHEX   Take 20 mg by mouth daily.      rivaroxaban 10 MG Tabs tablet   Commonly known as: XARELTO   Take 1 tablet (10 mg total) by mouth daily with breakfast.      traMADol 50 MG tablet   Commonly known as: ULTRAM   Take 100 mg by mouth every 6 (six) hours as needed.      Vitamin D (Ergocalciferol) 50000 UNITS Caps   Commonly known as: DRISDOL   Take 50,000 Units by mouth every 7 (seven) days. Sunday        Diagnostic Studies: Dg Chest 2 View  12/25/2012  *RADIOLOGY REPORT*  Clinical Data: Left knee replacement  CHEST - 2 VIEW  Comparison: None.  Findings: Normal normal cardiac silhouette.  There are chronic bronchitic markings centrally.  No effusion, infiltrate, pneumothorax. Degenerative osteophytosis of the thoracic spine. Anterior cervical fusion noted  IMPRESSION:  1.  No acute cardiopulmonary  process. 2.  Chronic bronchitic markings centrally.   Original Report Authenticated By: Genevive Bi, M.D.     Disposition: Nursing Home:  [name]      Discharge Orders    Future Orders Please Complete By Expires   Diet - low sodium heart healthy      Full weight bearing      Call MD / Call 911      Comments:   If you experience chest pain or shortness of breath, CALL 911 and be transported to the hospital emergency room.  If you develope a fever above 101 F, pus (white drainage) or increased drainage or redness at the wound, or calf pain, call your surgeon's office.   Constipation Prevention      Comments:   Drink plenty of fluids.  Prune juice may be helpful.  You may use a stool softener, such as Colace (over the counter) 100 mg twice a day.  Use MiraLax (over the counter) for constipation as needed.   Increase activity slowly as tolerated      Discharge instructions      Comments:   Keep knee incision dry for 5 days post op then may wet while bathing. Therapy daily and CPM goal full extension and greater than 90 degrees flexion. Call if fever or chills or increased drainage. Go to ER if acutely short of breath or call for ambulance. Return for follow up in 2 weeks. May full weight bear on the surgical leg unless told otherwise. Use knee immobilizer until able to straight leg raise off bed with knee stable. In house walking for first 2 weeks.   CPM      Comments:   Continuous passive motion machine (CPM):      Use the CPM from 0 to 60-90degrees for 4-6 hours per day.      You may increase by as tolersted per day.  You may break it up into 2 or 3 sessions per day.      Use CPM for 1 weeks or until you are told to stop.   Do not put a pillow under the knee. Place it under the heel.         Follow-up Information    Follow up with NITKA,JAMES E, MD. In 2 weeks.   Contact information:   [address] [place] [address] [phone]            Signed: Maud Deed  M 01/04/2013, 8:36 AM

## 2013-01-04 NOTE — Progress Notes (Signed)
Physical Therapy Treatment Patient Details Name: Diana Bowers MRN: [id number] DOB: [birth year] Today's Date: 01/04/2013 Time: 4098-1191 PT Time Calculation (min): 24 min  PT Assessment / Plan / Recommendation Comments on Treatment Session  Patient continues to progress. Awaiting SNF placement for this afternoon    Follow Up Recommendations  SNF     Does the patient have the potential to tolerate intense rehabilitation     Barriers to Discharge        Equipment Recommendations  Rolling walker with 5" wheels    Recommendations for Other Services    Frequency 7X/week   Plan Discharge plan remains appropriate;Frequency remains appropriate    Precautions / Restrictions Precautions Knee Immobilizer - Left: On except when in CPM;Discontinue once straight leg raise with < 10 degree lag Restrictions Weight Bearing Restrictions: Yes LLE Weight Bearing: Weight bearing as tolerated   Pertinent Vitals/Pain     Mobility  Bed Mobility Bed Mobility: Not assessed Transfers Sit to Stand: 6: Modified independent (Device/Increase time) Stand to Sit: 6: Modified independent (Device/Increase time) Ambulation/Gait Ambulation/Gait Assistance: 5: Supervision Ambulation Distance (Feet): 200 Feet Assistive device: Rolling walker Ambulation/Gait Assistance Details: Cues to keep RW midline. Patient with tendency to veer left. As ambulation increased patient with decreased stance time on L side Gait Pattern: Step-through pattern    Exercises Total Joint Exercises Quad Sets: AROM;Left;15 reps Heel Slides: AAROM;Left;15 reps Hip ABduction/ADduction: AROM;Left;15 reps Straight Leg Raises: AAROM;Left;15 reps   PT Diagnosis:    PT Problem List:   PT Treatment Interventions:     PT Goals Acute Rehab PT Goals PT Goal: Sit to Stand - Progress: Met PT Goal: Stand to Sit - Progress: Met PT Goal: Ambulate - Progress: Met PT Goal: Perform Home Exercise Program - Progress: Progressing toward  goal  Visit Information  Last PT Received On: 01/04/13 Assistance Needed: +1    Subjective Data      Cognition  Overall Cognitive Status: Appears within functional limits for tasks assessed/performed Arousal/Alertness: Awake/alert Orientation Level: Appears intact for tasks assessed Behavior During Session: [facility] for tasks performed    Balance     End of Session PT - End of Session Equipment Utilized During Treatment: Gait belt Activity Tolerance: Patient tolerated treatment well Patient left: in chair;with call bell/phone within reach Nurse Communication: Mobility status   GP     Fredrich Birks 01/04/2013, 9:50 AM 01/04/2013 Fredrich Birks PTA [phone] pager [phone] office

## 2013-01-04 NOTE — Progress Notes (Signed)
Subjective: 3 Days Post-Op Procedure(s) (LRB): COMPUTER ASSISTED TOTAL KNEE ARTHROPLASTY (Left) Awake, alert and Ox4. Patient reports pain as mild.    Objective:   VITALS:  Temp:  [98.2 F (36.8 C)-99.5 F (37.5 C)] 98.2 F (36.8 C) (01/09 0714) Pulse Rate:  [93-97] 95  (01/09 0714) Resp:  [16-20] 20  (01/09 0714) BP: (109-120)/(55-64) 120/64 mmHg (01/09 0714) SpO2:  [97 %-99 %] 98 % (01/09 0714)  Neurologically intact ABD soft Neurovascular intact Sensation intact distally Intact pulses distally Dorsiflexion/Plantar flexion intact Incision: no drainage No cellulitis present Compartment soft   LABS  Basename 01/04/13 0600 01/03/13 0616 01/02/13 0525  HGB 8.1* 8.8* 9.5*  WBC 10.2 10.1 --  PLT 160 148* --    Basename 01/02/13 0525  NA 139  K 5.0  CL 105  CO2 26  BUN 22  CREATININE 1.45*  GLUCOSE 101*   No results found for this basename: LABPT:2,INR:2 in the last 72 hours   Assessment/Plan: 3 Days Post-Op Procedure(s) (LRB): COMPUTER ASSISTED TOTAL KNEE ARTHROPLASTY (Left) Peri op acute blood loss anemia.  Advance diet Up with therapy Discharge to SNF today.  NITKA,JAMES E 01/04/2013, 8:22 AM

## 2013-01-04 NOTE — Clinical Social Work Placement (Signed)
Clinical Social Work Department  CLINICAL SOCIAL WORK PLACEMENT NOTE  01/03/13  Patient: Diana Bowers  Account Number: [id number] Admit date: 01/01/13  Clinical Social Worker: Sabino Niemann, MSW Date/time:01/03/13 11:30 AM  Clinical Social Work is seeking post-discharge placement for this patient at the following level of care: SKILLED NURSING (*CSW will update this form in Epic as items are completed)  01/03/13 Patient/family provided with [facility] Department of Clinical Social Work's list of facilities offering this level of care within the geographic area requested by the patient (or if unable, by the patient's family).  01/03/13 Patient/family informed of their freedom to choose among providers that offer the needed level of care, that participate in Medicare, Medicaid or managed care program needed by the patient, have an available bed and are willing to accept the patient.  01/03/13 Patient/family informed of [facility]' ownership interest in [facility], as well as of the fact that they are under no obligation to receive care at this facility.  PASARR submitted to EDS on01/08/14  PASARR number received from EDS on 01/03/13  FL2 transmitted to all facilities in geographic area requested by pt/family on 01/03/13  FL2 transmitted to all facilities within larger geographic area on  Patient informed that his/her managed care company has contracts with or will negotiate with certain facilities, including the following:  Patient/family informed of bed offers received:01/03/13  Patient chooses bed at [facility]  Physician recommends and patient chooses bed at  Patient to be transferred to on 01/04/13  Patient to be transferred to facility by Private vehicle The following physician request were entered in Epic:  Additional Comments:  Sabino Niemann, MSW,  [phone]

## 2013-01-04 NOTE — Plan of Care (Signed)
Problem: Discharge Progression Outcomes Goal: Ambulates safely using assistive device Outcome: Completed/Met Date Met:  01/04/13 Needs continued therapy

## 2013-01-04 NOTE — Progress Notes (Signed)
Pt d/c to SNF via private vehicle. Copy of instructions given to pt. Pt d/c'd with packet of information to give facility upon arrival. Scripts in packet of information. Pt d/c'd via wheelchair with belongings escorted by unit NT. Husband taking pt to facility.

## 2013-01-04 NOTE — Progress Notes (Signed)
Clinical social worker assisted with patient discharge to skilled nursing facility,[name].  CSW addressed all family questions and concerns. CSW copied chart and added all important documents. Patient transferred by private vehicle. Clinical Social Worker will sign off for now as social work intervention is no longer needed.    Sabino Niemann, MSW, [phone]

## 2013-01-08 NOTE — Discharge Summary (Signed)
Patient's discharge  summary reviewed with Vernon PA-C. 

## 2013-02-19 ENCOUNTER — Ambulatory Visit: Payer: 59 | Attending: Specialist | Admitting: Physical Therapy

## 2013-02-19 DIAGNOSIS — M25669 Stiffness of unspecified knee, not elsewhere classified: Secondary | ICD-10-CM | POA: Insufficient documentation

## 2013-02-19 DIAGNOSIS — IMO0001 Reserved for inherently not codable concepts without codable children: Secondary | ICD-10-CM | POA: Insufficient documentation

## 2013-02-19 DIAGNOSIS — M25569 Pain in unspecified knee: Secondary | ICD-10-CM | POA: Insufficient documentation

## 2013-02-19 DIAGNOSIS — Z96659 Presence of unspecified artificial knee joint: Secondary | ICD-10-CM | POA: Insufficient documentation

## 2013-02-21 ENCOUNTER — Other Ambulatory Visit: Payer: Self-pay

## 2013-02-21 ENCOUNTER — Ambulatory Visit: Payer: 59 | Admitting: Physical Therapy

## 2013-02-21 DIAGNOSIS — Z1231 Encounter for screening mammogram for malignant neoplasm of breast: Secondary | ICD-10-CM

## 2013-02-22 ENCOUNTER — Ambulatory Visit
Admission: RE | Admit: 2013-02-22 | Discharge: 2013-02-22 | Disposition: A | Discharge status: Home / Self Care | Payer: 59 | Source: Ambulatory Visit

## 2013-02-22 ENCOUNTER — Other Ambulatory Visit: Payer: Self-pay | Admitting: Internal Medicine

## 2013-02-22 DIAGNOSIS — Z1231 Encounter for screening mammogram for malignant neoplasm of breast: Secondary | ICD-10-CM

## 2013-02-23 ENCOUNTER — Ambulatory Visit: Payer: 59 | Admitting: Physical Therapy

## 2013-02-26 ENCOUNTER — Ambulatory Visit: Payer: 59 | Attending: Specialist | Admitting: Physical Therapy

## 2013-02-26 DIAGNOSIS — Z96659 Presence of unspecified artificial knee joint: Secondary | ICD-10-CM | POA: Insufficient documentation

## 2013-02-26 DIAGNOSIS — M25669 Stiffness of unspecified knee, not elsewhere classified: Secondary | ICD-10-CM | POA: Insufficient documentation

## 2013-02-26 DIAGNOSIS — IMO0001 Reserved for inherently not codable concepts without codable children: Secondary | ICD-10-CM | POA: Insufficient documentation

## 2013-02-26 DIAGNOSIS — M25569 Pain in unspecified knee: Secondary | ICD-10-CM | POA: Insufficient documentation

## 2013-02-28 ENCOUNTER — Ambulatory Visit: Payer: 59 | Admitting: Physical Therapy

## 2013-03-02 ENCOUNTER — Ambulatory Visit: Payer: 59 | Admitting: Physical Therapy

## 2013-03-06 ENCOUNTER — Ambulatory Visit: Payer: 59 | Admitting: Physical Therapy

## 2013-03-09 ENCOUNTER — Ambulatory Visit: Payer: 59 | Admitting: Physical Therapy

## 2013-03-12 ENCOUNTER — Ambulatory Visit: Payer: 59 | Admitting: Physical Therapy

## 2013-03-13 ENCOUNTER — Ambulatory Visit: Payer: 59 | Admitting: Physical Therapy

## 2013-03-15 ENCOUNTER — Ambulatory Visit: Payer: 59 | Admitting: Physical Therapy

## 2013-03-19 ENCOUNTER — Ambulatory Visit: Payer: 59 | Admitting: Physical Therapy

## 2013-03-21 ENCOUNTER — Ambulatory Visit: Payer: 59 | Admitting: Physical Therapy

## 2013-03-22 ENCOUNTER — Ambulatory Visit: Payer: 59 | Admitting: Physical Therapy

## 2013-03-26 ENCOUNTER — Ambulatory Visit: Payer: 59 | Admitting: Physical Therapy

## 2013-03-28 ENCOUNTER — Ambulatory Visit: Payer: 59 | Attending: Specialist | Admitting: Physical Therapy

## 2013-03-28 DIAGNOSIS — IMO0001 Reserved for inherently not codable concepts without codable children: Secondary | ICD-10-CM | POA: Insufficient documentation

## 2013-03-28 DIAGNOSIS — M25669 Stiffness of unspecified knee, not elsewhere classified: Secondary | ICD-10-CM | POA: Insufficient documentation

## 2013-03-28 DIAGNOSIS — M25569 Pain in unspecified knee: Secondary | ICD-10-CM | POA: Insufficient documentation

## 2013-03-28 DIAGNOSIS — Z96659 Presence of unspecified artificial knee joint: Secondary | ICD-10-CM | POA: Insufficient documentation

## 2013-03-30 ENCOUNTER — Ambulatory Visit: Payer: 59 | Admitting: Physical Therapy

## 2013-04-02 ENCOUNTER — Ambulatory Visit: Payer: 59 | Admitting: Physical Therapy

## 2013-04-04 ENCOUNTER — Ambulatory Visit: Payer: 59 | Admitting: Physical Therapy

## 2013-04-05 ENCOUNTER — Ambulatory Visit: Payer: 59 | Admitting: Physical Therapy

## 2013-04-09 ENCOUNTER — Ambulatory Visit: Payer: 59 | Admitting: Physical Therapy

## 2013-04-11 ENCOUNTER — Encounter: Payer: 59 | Admitting: Physical Therapy

## 2013-04-12 ENCOUNTER — Ambulatory Visit: Payer: 59 | Admitting: Physical Therapy

## 2013-04-16 ENCOUNTER — Ambulatory Visit: Payer: 59 | Admitting: Physical Therapy

## 2013-04-18 ENCOUNTER — Ambulatory Visit: Payer: 59 | Admitting: Physical Therapy

## 2013-04-20 ENCOUNTER — Ambulatory Visit: Payer: 59 | Admitting: Physical Therapy

## 2013-04-23 ENCOUNTER — Ambulatory Visit: Payer: 59 | Admitting: Physical Therapy

## 2013-04-24 ENCOUNTER — Ambulatory Visit: Payer: 59 | Admitting: Physical Therapy

## 2013-04-27 ENCOUNTER — Ambulatory Visit: Payer: 59 | Attending: Specialist | Admitting: Physical Therapy

## 2013-04-27 DIAGNOSIS — IMO0001 Reserved for inherently not codable concepts without codable children: Secondary | ICD-10-CM | POA: Insufficient documentation

## 2013-04-27 DIAGNOSIS — Z96659 Presence of unspecified artificial knee joint: Secondary | ICD-10-CM | POA: Insufficient documentation

## 2013-04-27 DIAGNOSIS — M25669 Stiffness of unspecified knee, not elsewhere classified: Secondary | ICD-10-CM | POA: Insufficient documentation

## 2013-04-27 DIAGNOSIS — M25569 Pain in unspecified knee: Secondary | ICD-10-CM | POA: Insufficient documentation

## 2013-05-02 ENCOUNTER — Ambulatory Visit: Payer: 59 | Admitting: Physical Therapy

## 2013-05-04 ENCOUNTER — Ambulatory Visit: Payer: 59 | Admitting: Physical Therapy

## 2013-05-08 ENCOUNTER — Ambulatory Visit: Payer: 59 | Admitting: Physical Therapy

## 2013-05-10 ENCOUNTER — Ambulatory Visit: Payer: 59 | Admitting: Physical Therapy

## 2013-05-15 ENCOUNTER — Ambulatory Visit: Payer: 59 | Admitting: Physical Therapy

## 2013-05-17 ENCOUNTER — Ambulatory Visit: Payer: 59 | Admitting: Physical Therapy

## 2014-01-15 ENCOUNTER — Other Ambulatory Visit: Payer: Self-pay

## 2014-01-15 DIAGNOSIS — Z1231 Encounter for screening mammogram for malignant neoplasm of breast: Secondary | ICD-10-CM

## 2014-02-03 ENCOUNTER — Emergency Department (HOSPITAL_BASED_OUTPATIENT_CLINIC_OR_DEPARTMENT_OTHER): Payer: 59

## 2014-02-03 ENCOUNTER — Encounter (HOSPITAL_BASED_OUTPATIENT_CLINIC_OR_DEPARTMENT_OTHER): Payer: Self-pay | Admitting: Emergency Medicine

## 2014-02-03 ENCOUNTER — Emergency Department (HOSPITAL_BASED_OUTPATIENT_CLINIC_OR_DEPARTMENT_OTHER)
Admission: EM | Admit: 2014-02-03 | Discharge: 2014-02-03 | Disposition: A | Discharge status: Home / Self Care | Payer: 59 | Attending: Emergency Medicine | Admitting: Emergency Medicine

## 2014-02-03 DIAGNOSIS — Z8739 Personal history of other diseases of the musculoskeletal system and connective tissue: Secondary | ICD-10-CM | POA: Insufficient documentation

## 2014-02-03 DIAGNOSIS — E119 Type 2 diabetes mellitus without complications: Secondary | ICD-10-CM | POA: Insufficient documentation

## 2014-02-03 DIAGNOSIS — Z880 Allergy status to penicillin: Secondary | ICD-10-CM | POA: Insufficient documentation

## 2014-02-03 DIAGNOSIS — R51 Headache: Secondary | ICD-10-CM | POA: Insufficient documentation

## 2014-02-03 DIAGNOSIS — Z79899 Other long term (current) drug therapy: Secondary | ICD-10-CM | POA: Insufficient documentation

## 2014-02-03 DIAGNOSIS — R519 Headache, unspecified: Secondary | ICD-10-CM

## 2014-02-03 DIAGNOSIS — E669 Obesity, unspecified: Secondary | ICD-10-CM | POA: Insufficient documentation

## 2014-02-03 DIAGNOSIS — K219 Gastro-esophageal reflux disease without esophagitis: Secondary | ICD-10-CM | POA: Insufficient documentation

## 2014-02-03 DIAGNOSIS — Z86718 Personal history of other venous thrombosis and embolism: Secondary | ICD-10-CM | POA: Insufficient documentation

## 2014-02-03 DIAGNOSIS — I10 Essential (primary) hypertension: Secondary | ICD-10-CM | POA: Insufficient documentation

## 2014-02-03 DIAGNOSIS — H53149 Visual discomfort, unspecified: Secondary | ICD-10-CM | POA: Insufficient documentation

## 2014-02-03 LAB — SEDIMENTATION RATE: Sed Rate: 24 mm/hr — ABNORMAL HIGH (ref 0–22)

## 2014-02-03 LAB — GLUCOSE, CAPILLARY: Glucose-Capillary: 101 mg/dL — ABNORMAL HIGH (ref 70–99)

## 2014-02-03 MED ORDER — HYDROCODONE-ACETAMINOPHEN 5-325 MG PO TABS: 2.0000 | ORAL_TABLET | ORAL | Status: DC | PRN

## 2014-02-03 MED ORDER — MORPHINE SULFATE 4 MG/ML IJ SOLN
4.0000 mg | Freq: Once | INTRAMUSCULAR | Status: AC
  Administered 2014-02-03: 4 mg via INTRAVENOUS
  Filled 2014-02-03: qty 1

## 2014-02-03 MED ORDER — METOCLOPRAMIDE HCL 5 MG/ML IJ SOLN
10.0000 mg | Freq: Once | INTRAMUSCULAR | Status: AC
  Administered 2014-02-03: 10 mg via INTRAVENOUS
  Filled 2014-02-03: qty 2

## 2014-02-03 NOTE — ED Notes (Signed)
CBG 101. Shary KeySteve Anayia Eugene RN notified.

## 2014-02-03 NOTE — ED Notes (Signed)
Patient transported to CT via stretcher.

## 2014-02-03 NOTE — Discharge Instructions (Signed)
Headaches, Frequently Asked Questions Take Tylenol for mild pain or the pain medicine prescribed for bad pain. See your doctor if headaches not well controlled with medication prescribed or return if your condition worsens for any reason MIGRAINE HEADACHES Q: What is migraine? What causes it? How can I treat it? A: Generally, migraine headaches begin as a dull ache. Then they develop into a constant, throbbing, and pulsating pain. You may experience pain at the temples. You may experience pain at the front or back of one or both sides of the head. The pain is usually accompanied by a combination of:  Nausea.  Vomiting.  Sensitivity to light and noise. Some people (about 15%) experience an aura (see below) before an attack. The cause of migraine is believed to be chemical reactions in the brain. Treatment for migraine may include over-the-counter or prescription medications. It may also include self-help techniques. These include relaxation training and biofeedback.  Q: What is an aura? A: About 15% of people with migraine get an "aura". This is a sign of neurological symptoms that occur before a migraine headache. You may see wavy or jagged lines, dots, or flashing lights. You might experience tunnel vision or blind spots in one or both eyes. The aura can include visual or auditory hallucinations (something imagined). It may include disruptions in smell (such as strange odors), taste or touch. Other symptoms include:  Numbness.  A "pins and needles" sensation.  Difficulty in recalling or speaking the correct word. These neurological events may last as long as 60 minutes. These symptoms will fade as the headache begins. Q: What is a trigger? A: Certain physical or environmental factors can lead to or "trigger" a migraine. These include:  Foods.  Hormonal changes.  Weather.  Stress. It is important to remember that triggers are different for everyone. To help prevent migraine attacks,  you need to figure out which triggers affect you. Keep a headache diary. This is a good way to track triggers. The diary will help you talk to your healthcare professional about your condition. Q: Does weather affect migraines? A: Bright sunshine, hot, humid conditions, and drastic changes in barometric pressure may lead to, or "trigger," a migraine attack in some people. But studies have shown that weather does not act as a trigger for everyone with migraines. Q: What is the link between migraine and hormones? A: Hormones start and regulate many of your body's functions. Hormones keep your body in balance within a constantly changing environment. The levels of hormones in your body are unbalanced at times. Examples are during menstruation, pregnancy, or menopause. That can lead to a migraine attack. In fact, about three quarters of all women with migraine report that their attacks are related to the menstrual cycle.  Q: Is there an increased risk of stroke for migraine sufferers? A: The likelihood of a migraine attack causing a stroke is very remote. That is not to say that migraine sufferers cannot have a stroke associated with their migraines. In persons under age 30, the most common associated factor for stroke is migraine headache. But over the course of a person's normal life span, the occurrence of migraine headache may actually be associated with a reduced risk of dying from cerebrovascular disease due to stroke.  Q: What are acute medications for migraine? A: Acute medications are used to treat the pain of the headache after it has started. Examples over-the-counter medications, NSAIDs, ergots, and triptans.  Q: What are the triptans? A: Triptans are the  newest class of abortive medications. They are specifically targeted to treat migraine. Triptans are vasoconstrictors. They moderate some chemical reactions in the brain. The triptans work on receptors in your brain. Triptans help to restore the  balance of a neurotransmitter called serotonin. Fluctuations in levels of serotonin are thought to be a main cause of migraine.  Q: Are over-the-counter medications for migraine effective? A: Over-the-counter, or "OTC," medications may be effective in relieving mild to moderate pain and associated symptoms of migraine. But you should see your caregiver before beginning any treatment regimen for migraine.  Q: What are preventive medications for migraine? A: Preventive medications for migraine are sometimes referred to as "prophylactic" treatments. They are used to reduce the frequency, severity, and length of migraine attacks. Examples of preventive medications include antiepileptic medications, antidepressants, beta-blockers, calcium channel blockers, and NSAIDs (nonsteroidal anti-inflammatory drugs). Q: Why are anticonvulsants used to treat migraine? A: During the past few years, there has been an increased interest in antiepileptic drugs for the prevention of migraine. They are sometimes referred to as "anticonvulsants". Both epilepsy and migraine may be caused by similar reactions in the brain.  Q: Why are antidepressants used to treat migraine? A: Antidepressants are typically used to treat people with depression. They may reduce migraine frequency by regulating chemical levels, such as serotonin, in the brain.  Q: What alternative therapies are used to treat migraine? A: The term "alternative therapies" is often used to describe treatments considered outside the scope of conventional Western medicine. Examples of alternative therapy include acupuncture, acupressure, and yoga. Another common alternative treatment is herbal therapy. Some herbs are believed to relieve headache pain. Always discuss alternative therapies with your caregiver before proceeding. Some herbal products contain arsenic and other toxins. TENSION HEADACHES Q: What is a tension-type headache? What causes it? How can I treat  it? A: Tension-type headaches occur randomly. They are often the result of temporary stress, anxiety, fatigue, or anger. Symptoms include soreness in your temples, a tightening band-like sensation around your head (a "vice-like" ache). Symptoms can also include a pulling feeling, pressure sensations, and contracting head and neck muscles. The headache begins in your forehead, temples, or the back of your head and neck. Treatment for tension-type headache may include over-the-counter or prescription medications. Treatment may also include self-help techniques such as relaxation training and biofeedback. CLUSTER HEADACHES Q: What is a cluster headache? What causes it? How can I treat it? A: Cluster headache gets its name because the attacks come in groups. The pain arrives with little, if any, warning. It is usually on one side of the head. A tearing or bloodshot eye and a runny nose on the same side of the headache may also accompany the pain. Cluster headaches are believed to be caused by chemical reactions in the brain. They have been described as the most severe and intense of any headache type. Treatment for cluster headache includes prescription medication and oxygen. SINUS HEADACHES Q: What is a sinus headache? What causes it? How can I treat it? A: When a cavity in the bones of the face and skull (a sinus) becomes inflamed, the inflammation will cause localized pain. This condition is usually the result of an allergic reaction, a tumor, or an infection. If your headache is caused by a sinus blockage, such as an infection, you will probably have a fever. An x-ray will confirm a sinus blockage. Your caregiver's treatment might include antibiotics for the infection, as well as antihistamines or decongestants.  REBOUND HEADACHES  Q: What is a rebound headache? What causes it? How can I treat it? A: A pattern of taking acute headache medications too often can lead to a condition known as "rebound  headache." A pattern of taking too much headache medication includes taking it more than 2 days per week or in excessive amounts. That means more than the label or a caregiver advises. With rebound headaches, your medications not only stop relieving pain, they actually begin to cause headaches. Doctors treat rebound headache by tapering the medication that is being overused. Sometimes your caregiver will gradually substitute a different type of treatment or medication. Stopping may be a challenge. Regularly overusing a medication increases the potential for serious side effects. Consult a caregiver if you regularly use headache medications more than 2 days per week or more than the label advises. ADDITIONAL QUESTIONS AND ANSWERS Q: What is biofeedback? A: Biofeedback is a self-help treatment. Biofeedback uses special equipment to monitor your body's involuntary physical responses. Biofeedback monitors:  Breathing.  Pulse.  Heart rate.  Temperature.  Muscle tension.  Brain activity. Biofeedback helps you refine and perfect your relaxation exercises. You learn to control the physical responses that are related to stress. Once the technique has been mastered, you do not need the equipment any more. Q: Are headaches hereditary? A: Four out of five (80%) of people that suffer report a family history of migraine. Scientists are not sure if this is genetic or a family predisposition. Despite the uncertainty, a child has a 50% chance of having migraine if one parent suffers. The child has a 75% chance if both parents suffer.  Q: Can children get headaches? A: By the time they reach high school, most young people have experienced some type of headache. Many safe and effective approaches or medications can prevent a headache from occurring or stop it after it has begun.  Q: What type of doctor should I see to diagnose and treat my headache? A: Start with your primary caregiver. Discuss his or her  experience and approach to headaches. Discuss methods of classification, diagnosis, and treatment. Your caregiver may decide to recommend you to a headache specialist, depending upon your symptoms or other physical conditions. Having diabetes, allergies, etc., may require a more comprehensive and inclusive approach to your headache. The National Headache Foundation will provide, upon request, a list of [facility] physician members in your state. Document Released: 03/04/2004 Document Revised: 03/06/2012 Document Reviewed: 08/12/2008 [facility] Patient Information 2014 [employer], [place].

## 2014-02-03 NOTE — ED Notes (Signed)
Patient reports that whle exercising on step yesterday developed left frontal and left temporal headache. No nausea, no relief with tylenol. Patient reports that left eye slightly blurry. Ambulatory with steady gait

## 2014-02-03 NOTE — ED Provider Notes (Addendum)
CSN: [id number]     Arrival date & time 02/03/14  [id number] History   First MD Initiated Contact with Patient 02/03/14 1039     Chief Complaint  Patient presents with  . Headache   (Consider location/radiation/quality/duration/timing/severity/associated sxs/prior Treatment) HPI Complains of left-sided headache temporal, nonradiating gradual onset yesterday morning 10 AM. Denies blurred vision admits to photophobia denies nausea no vomiting pain is nonradiating. She treated herself with Tylenol without relief. No focal numbness or weakness. No fever. No other associated symptoms. Nothing makes symptoms better or worse. Pain is 9 on a scale of 1-10 at present. Past Medical History  Diagnosis Date  . Hypertension   . GERD (gastroesophageal reflux disease)   . Hypercholesteremia   . DVT of lower extremity (deep venous thrombosis) 2007    "LLE; from birth control pill" (01/01/2013)  . Diabetes mellitus without complication     borderline no med  . Arthritis     "left knee" (01/01/2013)   Past Surgical History  Procedure Laterality Date  . Carpal tunnel release  2000    "right" (01/01/2013)  . 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 Lipoma excision  2000    back  . Cesarean section  1978  . Replacement total knee  01/01/2013    "left" (01/01/2013)  . Anterior cervical decomp/discectomy fusion  2006  . Knee arthroplasty  01/01/2013    Procedure: COMPUTER ASSISTED TOTAL KNEE ARTHROPLASTY;  Surgeon: Kerrin ChampagneJames E Nitka, MD;  Location: [facility] OR;  Service: Orthopedics;  Laterality: Left;  Left computer assisted total knee replacement   No family history on file. History  Substance Use Topics  . Smoking status: Never Smoker   . Smokeless tobacco: Never Used  . Alcohol Use: No   OB History   Grav Para Term Preterm Abortions TAB SAB Ect Mult Living                 Review of Systems  Eyes: Positive for photophobia.  Neurological: Positive for headaches.  All other systems reviewed and are negative.    Allergies   Penicillins  Home Medications   Current Outpatient Rx  Name  Route  Sig  Dispense  Refill  . CALCIUM PO   Oral   Take 1 tablet by mouth daily.         Marland Kitchen. diltiazem (CARDIZEM CD) 360 MG 24 hr capsule   Oral   Take 360 mg by mouth daily.         . RABEprazole (ACIPHEX) 20 MG tablet   Oral   Take 20 mg by mouth daily.         . traMADol (ULTRAM) 50 MG tablet   Oral   Take 100 mg by mouth every 6 (six) hours as needed.          BP 154/92  Pulse 86  Temp(Src) 98.6 F (37 C) (Oral)  Resp 22  Ht 5\' 3"  (1.6 m)  Wt 230 lb (104.327 kg)  BMI 40.75 kg/m2  SpO2 98% Physical Exam  Nursing note and vitals reviewed. Constitutional: She is oriented to person, place, and time. She appears well-developed and well-nourished.  HENT:  Head: Normocephalic and atraumatic.  Eyes: Conjunctivae are normal. Pupils are equal, round, and reactive to light.  Quarries appear normal, no no sob conjunctival erythema  Neck: Neck supple. No tracheal deviation present. No thyromegaly present.  Cardiovascular: Normal rate and regular rhythm.   No murmur heard. Pulmonary/Chest: Effort normal and breath sounds normal.  Abdominal: Soft.  Bowel sounds are normal. She exhibits no distension. There is no tenderness.  OBese  Musculoskeletal: Normal range of motion. She exhibits no edema and no tenderness.  Neurological: She is alert and oriented to person, place, and time. She has normal reflexes. No cranial nerve deficit. Coordination normal.  Gait normal Romberg normal pronator drift normal DTRs symmetric bilaterally knee jerk ankle jerk biceps toes are bilaterally  Skin: Skin is warm and dry. No rash noted.  Psychiatric: She has a normal mood and affect.    ED Course  Procedures (including critical care time) Labs Review Labs Reviewed - No data to display Imaging Review No results found.  EKG Interpretation   None      2: P.m. pain improved after treatment with Reglan and hydromorphone  intravenously, feels radial home. Results for orders placed during the hospital encounter of 02/03/14  SEDIMENTATION RATE      Result Value Range   Sed Rate 24 (*) 0 - 22 mm/hr  GLUCOSE, CAPILLARY      Result Value Range   Glucose-Capillary 101 (*) 70 - 99 mg/dL   Comment 1 Notify RN     Comment 2 Documented in Chart     Ct Head Wo Contrast  02/03/2014   CLINICAL DATA:  Left frontal and temporal headache. Blurred vision on the left.  EXAM: CT HEAD WITHOUT CONTRAST  TECHNIQUE: Contiguous axial images were obtained from the base of the skull through the vertex without intravenous contrast.  COMPARISON:  02/24/2006.  FINDINGS: Normal appearing cerebral hemispheres and posterior fossa structures. Normal size and position of the ventricles. No intracranial hemorrhage, mass lesion or CT evidence of acute infarction. Unremarkable bones and included paranasal sinuses.  IMPRESSION: Normal examination, unchanged.   Electronically Signed   By: Gordan Payment M.D.   On: 02/03/2014 11:15    MDM  No diagnosis found. doubt subarachnoid hemorrhage. No thunderclap headache. Doubt temporal arteritis. Sedimentation rate 24. Doubt acute narrow-angle glaucoma. Eyes appear normal. Plan prescription Norco  Diagnosis nonspecific headache    Doug Sou, MD 02/03/14 1413  Doug Sou, MD 02/03/14 [phone]

## 2014-02-25 ENCOUNTER — Ambulatory Visit
Admission: RE | Admit: 2014-02-25 | Discharge: 2014-02-25 | Disposition: A | Discharge status: Home / Self Care | Payer: Self-pay | Source: Ambulatory Visit

## 2014-02-25 DIAGNOSIS — Z1231 Encounter for screening mammogram for malignant neoplasm of breast: Secondary | ICD-10-CM

## 2015-03-24 ENCOUNTER — Other Ambulatory Visit: Payer: Self-pay

## 2015-03-24 DIAGNOSIS — Z1239 Encounter for other screening for malignant neoplasm of breast: Secondary | ICD-10-CM

## 2015-03-28 ENCOUNTER — Ambulatory Visit
Admission: RE | Admit: 2015-03-28 | Discharge: 2015-03-28 | Disposition: A | Discharge status: Home / Self Care | Payer: 59 | Source: Ambulatory Visit

## 2015-03-28 ENCOUNTER — Other Ambulatory Visit: Payer: Self-pay

## 2015-03-28 DIAGNOSIS — Z1231 Encounter for screening mammogram for malignant neoplasm of breast: Secondary | ICD-10-CM

## 2015-04-25 ENCOUNTER — Other Ambulatory Visit: Payer: Self-pay | Admitting: Specialist

## 2015-04-25 DIAGNOSIS — M542 Cervicalgia: Secondary | ICD-10-CM

## 2015-05-17 ENCOUNTER — Ambulatory Visit
Admission: RE | Admit: 2015-05-17 | Discharge: 2015-05-17 | Disposition: A | Discharge status: Home / Self Care | Payer: 59 | Source: Ambulatory Visit | Attending: Specialist | Admitting: Specialist

## 2015-05-17 DIAGNOSIS — M542 Cervicalgia: Secondary | ICD-10-CM

## 2015-07-02 ENCOUNTER — Other Ambulatory Visit (HOSPITAL_COMMUNITY): Payer: Self-pay | Admitting: Orthopaedic Surgery

## 2015-07-09 NOTE — Pre-Procedure Instructions (Signed)
Diana Bowers  07/09/2015      [facility] [id number] [place] - [address] AT [facility] [address] [place] [id number] Phone: [phone] Fax: [phone]    Your procedure is scheduled on Tuesday, July 22, 2015  [facility] Admitting at 11:00 A.M.  Call this number if you have problems the morning of surgery:  [phone]   Remember:  Do not eat food or drink liquids after midnight Monday, July 21, 2015  Take these medicines the morning of surgery with A SIP OF WATER:diltiazem (CARDIZEM CD) RABEprazole (ACIPHEX), if needed: pain medication  Stop taking Aspirin, vitamins and herbal medications. Do not take any NSAIDs ie: Ibuprofen, Advil, Naproxen or any medication containing Aspirin; stop 1 week prior to procedure ( Tuesday, July 15, 2015).  Do not wear jewelry, make-up or nail polish.  Do not wear lotions, powders, or perfumes.  You may not wear deodorant.  Do not shave 48 hours prior to surgery.    Do not bring valuables to the hospital.  [facility] is not responsible for any belongings or valuables.  Contacts, dentures or bridgework may not be worn into surgery.  Leave your suitcase in the car.  After surgery it may be brought to your room.  For patients admitted to the hospital, discharge time will be determined by your treatment team.  Patients discharged the day of surgery will not be allowed to drive home.   Name and phone number of your driver:   Special instructions: Special Instructions:Special Instructions: [facility] - [facility]  Before surgery, you can play an important role.  Because skin is not sterile, your skin needs to be as free of germs as possible.  You can reduce the number of germs on you skin by washing with CHG (chlorahexidine gluconate) soap before surgery.  CHG is an antiseptic cleaner which kills germs and bonds with the skin to continue killing germs even after washing.  Please DO  NOT use if you have an allergy to CHG or antibacterial soaps.  If your skin becomes reddened/irritated stop using the CHG and inform your nurse when you arrive at Short Stay.  Do not shave (including legs and underarms) for at least 48 hours prior to the first CHG shower.  You may shave your face.  Please follow these instructions carefully:   1.  Shower with CHG Soap the night before surgery and the morning of Surgery.  2.  If you choose to wash your hair, wash your hair first as usual with your normal shampoo.  3.  After you shampoo, rinse your hair and body thoroughly to remove the Shampoo.  4.  Use CHG as you would any other liquid soap.  You can apply chg directly  to the skin and wash gently with scrungie or a clean washcloth.  5.  Apply the CHG Soap to your body ONLY FROM THE NECK DOWN.  Do not use on open wounds or open sores.  Avoid contact with your eyes, ears, mouth and genitals (private parts).  Wash genitals (private parts) with your normal soap.  6.  Wash thoroughly, paying special attention to the area where your surgery will be performed.  7.  Thoroughly rinse your body with warm water from the neck down.  8.  DO NOT shower/wash with your normal soap after using and rinsing off the CHG Soap.  9.  Pat yourself dry  with a clean towel.            10.  Wear clean pajamas.            11.  Place clean sheets on your bed the night of your first shower and do not sleep with pets.  Day of Surgery  Do not apply any lotions/deodorants the morning of surgery.  Please wear clean clothes to the hospital/surgery center.  Please read over the following fact sheets that you were given. Pain Booklet, Coughing and Deep Breathing, MRSA Information and Surgical Site Infection Prevention

## 2015-07-10 ENCOUNTER — Encounter (HOSPITAL_COMMUNITY)
Admission: RE | Admit: 2015-07-10 | Discharge: 2015-07-10 | Disposition: A | Discharge status: Home / Self Care | Payer: Commercial Managed Care - HMO | Source: Ambulatory Visit | Attending: Orthopaedic Surgery | Admitting: Orthopaedic Surgery

## 2015-07-10 ENCOUNTER — Encounter (HOSPITAL_COMMUNITY): Payer: Self-pay

## 2015-07-10 DIAGNOSIS — Z01812 Encounter for preprocedural laboratory examination: Secondary | ICD-10-CM | POA: Diagnosis present

## 2015-07-10 DIAGNOSIS — M1611 Unilateral primary osteoarthritis, right hip: Secondary | ICD-10-CM | POA: Diagnosis not present

## 2015-07-10 HISTORY — DX: Headache, unspecified: R51.9

## 2015-07-10 HISTORY — DX: Headache: R51

## 2015-07-10 LAB — BASIC METABOLIC PANEL
ANION GAP: 9 (ref 5–15)
BUN: 18 mg/dL (ref 6–20)
CALCIUM: 9.7 mg/dL (ref 8.9–10.3)
CHLORIDE: 107 mmol/L (ref 101–111)
CO2: 24 mmol/L (ref 22–32)
Creatinine, Ser: 0.84 mg/dL (ref 0.44–1.00)
GFR calc Af Amer: >60 mL/min (ref >60)
GLUCOSE: 118 mg/dL — AB (ref 65–99)
Potassium: 4 mmol/L (ref 3.5–5.1)
Sodium: 140 mmol/L (ref 135–145)

## 2015-07-10 LAB — CBC
HCT: 39.6 % (ref 36.0–46.0)
HEMOGLOBIN: 12.7 g/dL (ref 12.0–15.0)
MCH: 25.8 pg — ABNORMAL LOW (ref 26.0–34.0)
MCHC: 32.1 g/dL (ref 30.0–36.0)
MCV: 80.3 fL (ref 78.0–100.0)
Platelets: 222 10*3/uL (ref 150–400)
RBC: 4.93 MIL/uL (ref 3.87–5.11)
RDW: 14 % (ref 11.5–15.5)
WBC: 8.9 10*3/uL (ref 4.0–10.5)

## 2015-07-10 LAB — SURGICAL PCR SCREEN
MRSA, PCR: NEGATIVE
STAPHYLOCOCCUS AUREUS: NEGATIVE

## 2015-07-10 LAB — GLUCOSE, CAPILLARY: Glucose-Capillary: 121 mg/dL — ABNORMAL HIGH (ref 65–99)

## 2015-07-10 NOTE — Progress Notes (Signed)
   07/10/15 1044  OBSTRUCTIVE SLEEP APNEA  Have you ever been diagnosed with sleep apnea through a sleep study? No  Do you snore loudly (loud enough to be heard through closed doors)?  1  Do you often feel tired, fatigued, or sleepy during the daytime? 0  Has anyone observed you stop breathing during your sleep? 1  Do you have, or are you being treated for high blood pressure? 1  BMI more than 35 kg/m2? 1  Age over 62 years old? 1  Neck circumference greater than 40 cm/16 inches? 0  Gender: 0  Obstructive Sleep Apnea Score 5

## 2015-07-10 NOTE — Progress Notes (Signed)
PCP- Dr. Caffie DammeKarla Bowers at [facility]  Cardiologist - denies CXR - requested EKG- requested Echo- requested Stress Test/Cardiac Cath - denies  [name].  Requested these as well as most recent visit records.  Pt. States that [name] - requested this as well.

## 2015-07-11 LAB — HEMOGLOBIN A1C
Hgb A1c MFr Bld: 6.6 % — ABNORMAL HIGH (ref 4.8–5.6)
MEAN PLASMA GLUCOSE: 143 mg/dL

## 2015-07-14 NOTE — Progress Notes (Signed)
[facility] requesting [facility], CXR, Echo

## 2015-07-17 NOTE — Progress Notes (Signed)
Spoke with [facility] and re-requested EKG, CXR, ECHO and OV note.

## 2015-07-22 ENCOUNTER — Encounter (HOSPITAL_COMMUNITY): Payer: Self-pay | Admitting: *Deleted

## 2015-07-22 ENCOUNTER — Inpatient Hospital Stay (HOSPITAL_COMMUNITY)
Admission: AD | Admit: 2015-07-22 | Discharge: 2015-07-25 | DRG: 470 | Disposition: A | Discharge status: Home Health | Payer: Commercial Managed Care - HMO | Source: Ambulatory Visit | Attending: Orthopaedic Surgery | Admitting: Orthopaedic Surgery

## 2015-07-22 ENCOUNTER — Inpatient Hospital Stay (HOSPITAL_COMMUNITY): Payer: Commercial Managed Care - HMO | Admitting: Certified Registered Nurse Anesthetist

## 2015-07-22 ENCOUNTER — Encounter (HOSPITAL_COMMUNITY)
Admission: AD | Disposition: A | Discharge status: Home Health | Payer: Self-pay | Source: Ambulatory Visit | Attending: Orthopaedic Surgery

## 2015-07-22 ENCOUNTER — Inpatient Hospital Stay (HOSPITAL_COMMUNITY): Payer: Commercial Managed Care - HMO

## 2015-07-22 DIAGNOSIS — K219 Gastro-esophageal reflux disease without esophagitis: Secondary | ICD-10-CM | POA: Diagnosis present

## 2015-07-22 DIAGNOSIS — Z880 Allergy status to penicillin: Secondary | ICD-10-CM

## 2015-07-22 DIAGNOSIS — D62 Acute posthemorrhagic anemia: Secondary | ICD-10-CM | POA: Diagnosis not present

## 2015-07-22 DIAGNOSIS — E119 Type 2 diabetes mellitus without complications: Secondary | ICD-10-CM | POA: Diagnosis present

## 2015-07-22 DIAGNOSIS — E780 Pure hypercholesterolemia: Secondary | ICD-10-CM | POA: Diagnosis present

## 2015-07-22 DIAGNOSIS — Z7982 Long term (current) use of aspirin: Secondary | ICD-10-CM | POA: Diagnosis not present

## 2015-07-22 DIAGNOSIS — I10 Essential (primary) hypertension: Secondary | ICD-10-CM | POA: Diagnosis present

## 2015-07-22 DIAGNOSIS — Z86718 Personal history of other venous thrombosis and embolism: Secondary | ICD-10-CM

## 2015-07-22 DIAGNOSIS — Z6841 Body Mass Index (BMI) 40.0 and over, adult: Secondary | ICD-10-CM | POA: Diagnosis not present

## 2015-07-22 DIAGNOSIS — Z96641 Presence of right artificial hip joint: Secondary | ICD-10-CM

## 2015-07-22 DIAGNOSIS — M25551 Pain in right hip: Secondary | ICD-10-CM | POA: Diagnosis present

## 2015-07-22 DIAGNOSIS — Z79899 Other long term (current) drug therapy: Secondary | ICD-10-CM

## 2015-07-22 DIAGNOSIS — M1611 Unilateral primary osteoarthritis, right hip: Secondary | ICD-10-CM | POA: Diagnosis present

## 2015-07-22 DIAGNOSIS — Z419 Encounter for procedure for purposes other than remedying health state, unspecified: Secondary | ICD-10-CM

## 2015-07-22 HISTORY — PX: TOTAL HIP ARTHROPLASTY: SHX124

## 2015-07-22 LAB — GLUCOSE, CAPILLARY
GLUCOSE-CAPILLARY: 94 mg/dL (ref 65–99)
Glucose-Capillary: 110 mg/dL — ABNORMAL HIGH (ref 65–99)

## 2015-07-22 SURGERY — ARTHROPLASTY, HIP, TOTAL, ANTERIOR APPROACH
Anesthesia: Monitor Anesthesia Care | Site: Hip | Laterality: Right

## 2015-07-22 MED ORDER — PROMETHAZINE HCL 25 MG/ML IJ SOLN: 6.2500 mg | INTRAMUSCULAR | Status: DC | PRN

## 2015-07-22 MED ORDER — GABAPENTIN 300 MG PO CAPS
300.0000 mg | ORAL_CAPSULE | Freq: Two times a day (BID) | ORAL | Status: DC
  Administered 2015-07-22 – 2015-07-25 (×5): 300 mg via ORAL
  Filled 2015-07-22 (×6): qty 1

## 2015-07-22 MED ORDER — ONDANSETRON HCL 4 MG/2ML IJ SOLN
4.0000 mg | Freq: Four times a day (QID) | INTRAMUSCULAR | Status: DC | PRN
  Administered 2015-07-22: 4 mg via INTRAVENOUS
  Filled 2015-07-22: qty 2

## 2015-07-22 MED ORDER — METHOCARBAMOL 500 MG PO TABS
ORAL_TABLET | ORAL | Status: AC
  Administered 2015-07-22: 500 mg via ORAL
  Filled 2015-07-22: qty 1

## 2015-07-22 MED ORDER — ARTIFICIAL TEARS OP OINT
TOPICAL_OINTMENT | OPHTHALMIC | Status: AC
  Filled 2015-07-22: qty 3.5

## 2015-07-22 MED ORDER — HYDROMORPHONE HCL 1 MG/ML IJ SOLN
0.2500 mg | INTRAMUSCULAR | Status: DC | PRN
  Administered 2015-07-22 (×2): 0.5 mg via INTRAVENOUS

## 2015-07-22 MED ORDER — DILTIAZEM HCL ER COATED BEADS 360 MG PO CP24
360.0000 mg | ORAL_CAPSULE | Freq: Every day | ORAL | Status: DC
  Filled 2015-07-22: qty 2
  Filled 2015-07-22 (×4): qty 1
  Filled 2015-07-22: qty 2

## 2015-07-22 MED ORDER — ACETAMINOPHEN 325 MG PO TABS
650.0000 mg | ORAL_TABLET | Freq: Four times a day (QID) | ORAL | Status: DC | PRN
  Administered 2015-07-23 – 2015-07-24 (×2): 650 mg via ORAL
  Filled 2015-07-22 (×2): qty 2

## 2015-07-22 MED ORDER — SODIUM CHLORIDE 0.9 % IR SOLN
Status: DC | PRN
  Administered 2015-07-22: 3000 mL

## 2015-07-22 MED ORDER — HYDROMORPHONE HCL 1 MG/ML IJ SOLN
INTRAMUSCULAR | Status: AC
  Administered 2015-07-22: 0.5 mg via INTRAVENOUS
  Filled 2015-07-22: qty 1

## 2015-07-22 MED ORDER — OXYCODONE HCL 5 MG PO TABS
5.0000 mg | ORAL_TABLET | ORAL | Status: DC | PRN
  Administered 2015-07-22 – 2015-07-23 (×3): 10 mg via ORAL
  Administered 2015-07-23 – 2015-07-24 (×3): 5 mg via ORAL
  Administered 2015-07-25: 10 mg via ORAL
  Filled 2015-07-22: qty 1
  Filled 2015-07-22: qty 2
  Filled 2015-07-22: qty 1
  Filled 2015-07-22: qty 2
  Filled 2015-07-22: qty 1
  Filled 2015-07-22: qty 2

## 2015-07-22 MED ORDER — ALBUMIN HUMAN 5 % IV SOLN
INTRAVENOUS | Status: DC | PRN
  Administered 2015-07-22: 14:00:00 via INTRAVENOUS

## 2015-07-22 MED ORDER — ZOLPIDEM TARTRATE 5 MG PO TABS
5.0000 mg | ORAL_TABLET | Freq: Every evening | ORAL | Status: DC | PRN
Start: 2015-07-22 — End: 2015-07-25

## 2015-07-22 MED ORDER — MIDAZOLAM HCL 2 MG/2ML IJ SOLN
INTRAMUSCULAR | Status: AC
  Filled 2015-07-22: qty 2

## 2015-07-22 MED ORDER — DIPHENHYDRAMINE HCL 12.5 MG/5ML PO ELIX: 12.5000 mg | ORAL_SOLUTION | ORAL | Status: DC | PRN

## 2015-07-22 MED ORDER — EPHEDRINE SULFATE 50 MG/ML IJ SOLN
INTRAMUSCULAR | Status: AC
  Filled 2015-07-22: qty 1

## 2015-07-22 MED ORDER — MIDAZOLAM HCL 5 MG/5ML IJ SOLN
INTRAMUSCULAR | Status: DC | PRN
  Administered 2015-07-22: 2 mg via INTRAVENOUS

## 2015-07-22 MED ORDER — PHENYLEPHRINE 40 MCG/ML (10ML) SYRINGE FOR IV PUSH (FOR BLOOD PRESSURE SUPPORT)
PREFILLED_SYRINGE | INTRAVENOUS | Status: AC
  Filled 2015-07-22: qty 10

## 2015-07-22 MED ORDER — ONDANSETRON HCL 4 MG PO TABS: 4.0000 mg | ORAL_TABLET | Freq: Four times a day (QID) | ORAL | Status: DC | PRN

## 2015-07-22 MED ORDER — ALUM & MAG HYDROXIDE-SIMETH 200-200-20 MG/5ML PO SUSP: 30.0000 mL | ORAL | Status: DC | PRN

## 2015-07-22 MED ORDER — VITAMIN D3 25 MCG (1000 UNIT) PO TABS
1000.0000 [IU] | ORAL_TABLET | Freq: Every day | ORAL | Status: DC
  Administered 2015-07-22 – 2015-07-24 (×3): 1000 [IU] via ORAL
  Filled 2015-07-22 (×7): qty 1

## 2015-07-22 MED ORDER — PROPOFOL 10 MG/ML IV BOLUS
INTRAVENOUS | Status: AC
  Filled 2015-07-22: qty 20

## 2015-07-22 MED ORDER — DEXTROSE 5 % IV SOLN
10.0000 mg | INTRAVENOUS | Status: DC | PRN
  Administered 2015-07-22: 40 ug/min via INTRAVENOUS

## 2015-07-22 MED ORDER — CLINDAMYCIN PHOSPHATE 900 MG/50ML IV SOLN
900.0000 mg | INTRAVENOUS | Status: AC
  Administered 2015-07-22: 900 mg via INTRAVENOUS

## 2015-07-22 MED ORDER — CLINDAMYCIN PHOSPHATE 600 MG/50ML IV SOLN
600.0000 mg | Freq: Four times a day (QID) | INTRAVENOUS | Status: AC
  Administered 2015-07-22 – 2015-07-23 (×2): 600 mg via INTRAVENOUS
  Filled 2015-07-22 (×2): qty 50

## 2015-07-22 MED ORDER — EPHEDRINE SULFATE 50 MG/ML IJ SOLN
INTRAMUSCULAR | Status: DC | PRN
  Administered 2015-07-22: 10 mg via INTRAVENOUS
  Administered 2015-07-22: 5 mg via INTRAVENOUS
  Administered 2015-07-22: 10 mg via INTRAVENOUS

## 2015-07-22 MED ORDER — STERILE WATER FOR INJECTION IJ SOLN
INTRAMUSCULAR | Status: AC
  Filled 2015-07-22: qty 10

## 2015-07-22 MED ORDER — HYDROMORPHONE HCL 1 MG/ML IJ SOLN
1.0000 mg | INTRAMUSCULAR | Status: DC | PRN
  Administered 2015-07-22: 1 mg via INTRAVENOUS
  Filled 2015-07-22: qty 1

## 2015-07-22 MED ORDER — METHOCARBAMOL 500 MG PO TABS
500.0000 mg | ORAL_TABLET | Freq: Four times a day (QID) | ORAL | Status: DC | PRN
  Administered 2015-07-22 – 2015-07-24 (×4): 500 mg via ORAL
  Filled 2015-07-22 (×3): qty 1

## 2015-07-22 MED ORDER — OXYCODONE HCL ER 15 MG PO T12A
15.0000 mg | EXTENDED_RELEASE_TABLET | Freq: Two times a day (BID) | ORAL | Status: DC
  Administered 2015-07-22 – 2015-07-25 (×6): 15 mg via ORAL
  Filled 2015-07-22 (×6): qty 1

## 2015-07-22 MED ORDER — FENTANYL CITRATE (PF) 100 MCG/2ML IJ SOLN
INTRAMUSCULAR | Status: DC | PRN
  Administered 2015-07-22: 50 ug via INTRAVENOUS

## 2015-07-22 MED ORDER — SODIUM CHLORIDE 0.9 % IV SOLN
INTRAVENOUS | Status: DC
  Administered 2015-07-22: 75 mL/h via INTRAVENOUS
  Administered 2015-07-23: 05:00:00 via INTRAVENOUS

## 2015-07-22 MED ORDER — METOCLOPRAMIDE HCL 5 MG PO TABS: 5.0000 mg | ORAL_TABLET | Freq: Three times a day (TID) | ORAL | Status: DC | PRN

## 2015-07-22 MED ORDER — ONDANSETRON HCL 4 MG/2ML IJ SOLN
INTRAMUSCULAR | Status: AC
  Filled 2015-07-22: qty 2

## 2015-07-22 MED ORDER — METHOCARBAMOL 1000 MG/10ML IJ SOLN
500.0000 mg | Freq: Four times a day (QID) | INTRAMUSCULAR | Status: DC | PRN
  Filled 2015-07-22: qty 5

## 2015-07-22 MED ORDER — MENTHOL 3 MG MT LOZG: 1.0000 | LOZENGE | OROMUCOSAL | Status: DC | PRN

## 2015-07-22 MED ORDER — PROPOFOL 10 MG/ML IV BOLUS
INTRAVENOUS | Status: DC | PRN
  Administered 2015-07-22: 10 mg via INTRAVENOUS

## 2015-07-22 MED ORDER — DOCUSATE SODIUM 100 MG PO CAPS
100.0000 mg | ORAL_CAPSULE | Freq: Two times a day (BID) | ORAL | Status: DC
  Administered 2015-07-22 – 2015-07-25 (×6): 100 mg via ORAL
  Filled 2015-07-22 (×6): qty 1

## 2015-07-22 MED ORDER — TRANEXAMIC ACID 1000 MG/10ML IV SOLN: 1000.0000 mg | INTRAVENOUS | Status: DC

## 2015-07-22 MED ORDER — ROCURONIUM BROMIDE 50 MG/5ML IV SOLN
INTRAVENOUS | Status: AC
Start: 2015-07-22 — End: 2015-07-22
  Filled 2015-07-22: qty 1

## 2015-07-22 MED ORDER — ACETAMINOPHEN 650 MG RE SUPP: 650.0000 mg | Freq: Four times a day (QID) | RECTAL | Status: DC | PRN

## 2015-07-22 MED ORDER — PANTOPRAZOLE SODIUM 40 MG PO TBEC
40.0000 mg | DELAYED_RELEASE_TABLET | Freq: Every day | ORAL | Status: DC
  Administered 2015-07-22 – 2015-07-23 (×2): 40 mg via ORAL
  Filled 2015-07-22 (×2): qty 1

## 2015-07-22 MED ORDER — ASPIRIN EC 325 MG PO TBEC
325.0000 mg | DELAYED_RELEASE_TABLET | Freq: Two times a day (BID) | ORAL | Status: DC
  Administered 2015-07-22 – 2015-07-24 (×5): 325 mg via ORAL
  Filled 2015-07-22 (×5): qty 1

## 2015-07-22 MED ORDER — ROSUVASTATIN CALCIUM 10 MG PO TABS
10.0000 mg | ORAL_TABLET | Freq: Every day | ORAL | Status: DC
  Administered 2015-07-22 – 2015-07-24 (×3): 10 mg via ORAL
  Filled 2015-07-22 (×3): qty 1

## 2015-07-22 MED ORDER — 0.9 % SODIUM CHLORIDE (POUR BTL) OPTIME
TOPICAL | Status: DC | PRN
  Administered 2015-07-22: 1000 mL

## 2015-07-22 MED ORDER — CLINDAMYCIN PHOSPHATE 900 MG/50ML IV SOLN
INTRAVENOUS | Status: AC
  Filled 2015-07-22: qty 50

## 2015-07-22 MED ORDER — OXYCODONE HCL 5 MG PO TABS
ORAL_TABLET | ORAL | Status: AC
  Administered 2015-07-22: 10 mg via ORAL
  Filled 2015-07-22: qty 2

## 2015-07-22 MED ORDER — PHENYLEPHRINE HCL 10 MG/ML IJ SOLN
INTRAMUSCULAR | Status: DC | PRN
  Administered 2015-07-22: 40 ug via INTRAVENOUS

## 2015-07-22 MED ORDER — METOCLOPRAMIDE HCL 5 MG/ML IJ SOLN: 5.0000 mg | Freq: Three times a day (TID) | INTRAMUSCULAR | Status: DC | PRN

## 2015-07-22 MED ORDER — PHENOL 1.4 % MT LIQD: 1.0000 | OROMUCOSAL | Status: DC | PRN

## 2015-07-22 MED ORDER — PROPOFOL INFUSION 10 MG/ML OPTIME
INTRAVENOUS | Status: DC | PRN
  Administered 2015-07-22: 50 ug/kg/min via INTRAVENOUS

## 2015-07-22 MED ORDER — FENTANYL CITRATE (PF) 250 MCG/5ML IJ SOLN
INTRAMUSCULAR | Status: AC
  Filled 2015-07-22: qty 5

## 2015-07-22 MED ORDER — LACTATED RINGERS IV SOLN
INTRAVENOUS | Status: DC
  Administered 2015-07-22 (×2): via INTRAVENOUS

## 2015-07-22 SURGICAL SUPPLY — 55 items
APL SKNCLS STERI-STRIP NONHPOA (GAUZE/BANDAGES/DRESSINGS)
BENZOIN TINCTURE PRP APPL 2/3 (GAUZE/BANDAGES/DRESSINGS) ×1 IMPLANT
BLADE SAW SGTL 18X1.27X75 (BLADE) ×2 IMPLANT
BLADE SAW SGTL 18X1.27X75MM (BLADE) ×1
BLADE SURG ROTATE 9660 (MISCELLANEOUS) IMPLANT
CAPT HIP TOTAL 2 ×2 IMPLANT
CELLS DAT CNTRL 66122 CELL SVR (MISCELLANEOUS) ×1 IMPLANT
CLOSURE WOUND 1/2 X4 (GAUZE/BANDAGES/DRESSINGS) ×2
COVER SURGICAL LIGHT HANDLE (MISCELLANEOUS) ×3 IMPLANT
DRAPE C-ARM 42X72 X-RAY (DRAPES) ×3 IMPLANT
DRAPE STERI IOBAN 125X83 (DRAPES) ×3 IMPLANT
DRAPE U-SHAPE 47X51 STRL (DRAPES) ×9 IMPLANT
DRSG AQUACEL AG ADV 3.5X10 (GAUZE/BANDAGES/DRESSINGS) ×3 IMPLANT
DURAPREP 26ML APPLICATOR (WOUND CARE) ×3 IMPLANT
ELECT BLADE 4.0 EZ CLEAN MEGAD (MISCELLANEOUS) ×3
ELECT BLADE 6.5 EXT (BLADE) IMPLANT
ELECT REM PT RETURN 9FT ADLT (ELECTROSURGICAL) ×3
ELECTRODE BLDE 4.0 EZ CLN MEGD (MISCELLANEOUS) ×1 IMPLANT
ELECTRODE REM PT RTRN 9FT ADLT (ELECTROSURGICAL) ×1 IMPLANT
FACESHIELD WRAPAROUND (MASK) ×6 IMPLANT
FACESHIELD WRAPAROUND OR TEAM (MASK) ×2 IMPLANT
GAUZE XEROFORM 1X8 LF (GAUZE/BANDAGES/DRESSINGS) ×2 IMPLANT
GLOVE BIOGEL PI IND STRL 8 (GLOVE) ×2 IMPLANT
GLOVE BIOGEL PI INDICATOR 8 (GLOVE) ×4
GLOVE ECLIPSE 8.0 STRL XLNG CF (GLOVE) ×3 IMPLANT
GLOVE ORTHO TXT STRL SZ7.5 (GLOVE) ×6 IMPLANT
GOWN STRL REUS W/ TWL LRG LVL3 (GOWN DISPOSABLE) ×2 IMPLANT
GOWN STRL REUS W/ TWL XL LVL3 (GOWN DISPOSABLE) ×2 IMPLANT
GOWN STRL REUS W/TWL LRG LVL3 (GOWN DISPOSABLE) ×6
GOWN STRL REUS W/TWL XL LVL3 (GOWN DISPOSABLE) ×6
HANDPIECE INTERPULSE COAX TIP (DISPOSABLE) ×3
KIT BASIN OR (CUSTOM PROCEDURE TRAY) ×3 IMPLANT
KIT ROOM TURNOVER OR (KITS) ×3 IMPLANT
MANIFOLD NEPTUNE II (INSTRUMENTS) ×3 IMPLANT
NS IRRIG 1000ML POUR BTL (IV SOLUTION) ×3 IMPLANT
PACK TOTAL JOINT (CUSTOM PROCEDURE TRAY) ×3 IMPLANT
PACK UNIVERSAL I (CUSTOM PROCEDURE TRAY) ×3 IMPLANT
PAD ARMBOARD 7.5X6 YLW CONV (MISCELLANEOUS) ×3 IMPLANT
RETRACTOR WND ALEXIS 18 MED (MISCELLANEOUS) ×1 IMPLANT
RTRCTR WOUND ALEXIS 18CM MED (MISCELLANEOUS) ×3
SET HNDPC FAN SPRY TIP SCT (DISPOSABLE) ×1 IMPLANT
STAPLER VISISTAT 35W (STAPLE) ×2 IMPLANT
STRIP CLOSURE SKIN 1/2X4 (GAUZE/BANDAGES/DRESSINGS) ×4 IMPLANT
SUT ETHIBOND NAB CT1 #1 30IN (SUTURE) ×3 IMPLANT
SUT MNCRL AB 4-0 PS2 18 (SUTURE) IMPLANT
SUT VIC AB 0 CT1 27 (SUTURE) ×3
SUT VIC AB 0 CT1 27XBRD ANBCTR (SUTURE) ×1 IMPLANT
SUT VIC AB 1 CT1 27 (SUTURE) ×3
SUT VIC AB 1 CT1 27XBRD ANBCTR (SUTURE) ×1 IMPLANT
SUT VIC AB 2-0 CT1 27 (SUTURE) ×3
SUT VIC AB 2-0 CT1 TAPERPNT 27 (SUTURE) ×1 IMPLANT
TOWEL OR 17X24 6PK STRL BLUE (TOWEL DISPOSABLE) ×3 IMPLANT
TOWEL OR 17X26 10 PK STRL BLUE (TOWEL DISPOSABLE) ×3 IMPLANT
TRAY FOLEY CATH 16FRSI W/METER (SET/KITS/TRAYS/PACK) ×2 IMPLANT
WATER STERILE IRR 1000ML POUR (IV SOLUTION) ×2 IMPLANT

## 2015-07-22 NOTE — Plan of Care (Signed)
Problem: Consults Goal: Diagnosis- Total Joint Replacement Primary Total Hip Right     

## 2015-07-22 NOTE — Addendum Note (Signed)
Addendum  created 07/22/15 1727 by Heather Roberts, MD   Modules edited: Notes Section   Notes Section:  File: [id number]

## 2015-07-22 NOTE — Anesthesia Procedure Notes (Addendum)
Procedure Name: MAC Date/Time: 07/22/2015 12:32 PM Performed by: Margaree Mackintosh Pre-anesthesia Checklist: Patient identified, Emergency Drugs available, Suction available, Patient being monitored and Timeout performed Patient Re-evaluated:Patient Re-evaluated prior to inductionOxygen Delivery Method: Simple face mask Preoxygenation: Pre-oxygenation with 100% oxygen Intubation Type: IV induction Number of attempts: 1 Placement Confirmation: positive ETCO2 Dental Injury: Teeth and Oropharynx as per pre-operative assessment    Spinal Patient location during procedure: OR Start time: 07/22/2015 12:32 PM End time: 07/22/2015 1:42 PM Staffing Anesthesiologist: Heather Roberts Performed by: anesthesiologist  Preanesthetic Checklist Completed: patient identified, surgical consent, pre-op evaluation, timeout performed, IV checked, risks and benefits discussed and monitors and equipment checked Spinal Block Patient position: sitting Prep: DuraPrep Patient monitoring: cardiac monitor, continuous pulse ox and blood pressure Approach: midline Location: L2-3 Injection technique: single-shot Needle Needle type: Pencan  Needle gauge: 24 G Needle length: 9 cm Additional Notes Functioning IV was confirmed and monitors were applied. Sterile prep and drape, including hand hygiene and sterile gloves were used. The patient was positioned and the spine was prepped. The skin was anesthetized with lidocaine.  Free flow of clear CSF was obtained prior to injecting local anesthetic into the CSF.  The spinal needle aspirated freely following injection.  The needle was carefully withdrawn.  The patient tolerated the procedure well.

## 2015-07-22 NOTE — Anesthesia Postprocedure Evaluation (Addendum)
Anesthesia Post Note  Patient: Diana Bowers  Procedure(s) Performed: Procedure(s) (LRB): RIGHT TOTAL HIP ARTHROPLASTY ANTERIOR APPROACH (Right)  Anesthesia type: SAB  Patient location: PACU  Post pain: Pain level controlled  Post assessment: Patient's Cardiovascular Status Stable, SAB receding   Last Vitals:  Filed Vitals:   07/22/15 1600  BP: 117/78  Pulse: 68  Temp:   Resp: 16    Post vital signs: Reviewed and stable  Level of consciousness: sedated  Complications: No apparent anesthesia complications

## 2015-07-22 NOTE — Brief Op Note (Signed)
07/22/2015  2:06 PM  PATIENT:  Diana Bowers  64 y.o. female  PRE-OPERATIVE DIAGNOSIS:  Primary osteoarthritis right hip  POST-OPERATIVE DIAGNOSIS:  Primary osteoarthritis right hip  PROCEDURE:  Procedure(s): RIGHT TOTAL HIP ARTHROPLASTY ANTERIOR APPROACH (Right)  SURGEON:  Surgeon(s) and Role:    * Kathryne Hitch, MD - Primary  PHYSICIAN ASSISTANT: Rexene Edison, PA-C  ANESTHESIA:   spinal  EBL:  Total I/O In: 1250 [I.V.:1000; IV Piggyback:250] Out: 450 [Urine:150; Blood:300]  BLOOD ADMINISTERED:none  DRAINS: none   LOCAL MEDICATIONS USED:  NONE  SPECIMEN:  No Specimen  DISPOSITION OF SPECIMEN:  N/A  COUNTS:  YES  TOURNIQUET:  * No tourniquets in log *  DICTATION: .Other Dictation: Dictation Number [id number]  PLAN OF CARE: Admit to inpatient   PATIENT DISPOSITION:  PACU - hemodynamically stable.   Delay start of Pharmacological VTE agent (>24hrs) due to surgical blood loss or risk of bleeding: no

## 2015-07-22 NOTE — Anesthesia Preprocedure Evaluation (Addendum)
Anesthesia Evaluation  Patient identified by MRN, date of birth, ID band Patient awake    Reviewed: Allergy & Precautions, H&P , NPO status , Patient's Chart, lab work & pertinent test results  History of Anesthesia Complications Negative for: history of anesthetic complications  Airway Mallampati: II  TM Distance: >3 FB     Dental  (+) Teeth Intact, Dental Advisory Given   Pulmonary neg pulmonary ROS,    Pulmonary exam normal       Cardiovascular hypertension, Pt. on medications + Peripheral Vascular Disease Normal cardiovascular exam    Neuro/Psych negative neurological ROS  negative psych ROS   GI/Hepatic Neg liver ROS, GERD-  Medicated,  Endo/Other  diabetesMorbid obesity  Renal/GU negative Renal ROS     Musculoskeletal   Abdominal   Peds  Hematology   Anesthesia Other Findings   Reproductive/Obstetrics                            Anesthesia Physical  Anesthesia Plan  ASA: III  Anesthesia Plan: MAC and Spinal   Post-op Pain Management:    Induction: Intravenous  Airway Management Planned:   Additional Equipment:   Intra-op Plan:   Post-operative Plan:   Informed Consent: I have reviewed the patients History and Physical, chart, labs and discussed the procedure including the risks, benefits and alternatives for the proposed anesthesia with the patient or authorized representative who has indicated his/her understanding and acceptance.   Dental advisory given  Plan Discussed with: CRNA, Anesthesiologist and Surgeon  Anesthesia Plan Comments:        Anesthesia Quick Evaluation

## 2015-07-22 NOTE — H&P (Signed)
TOTAL HIP ADMISSION H&P  Patient is admitted for right total hip arthroplasty.  Subjective:  Chief Complaint: right hip pain  HPI: Diana Bowers, 64 y.o. female, has a history of pain and functional disability in the right hip(s) due to arthritis and patient has failed non-surgical conservative treatments for greater than 12 weeks to include NSAID's and/or analgesics, corticosteriod injections, flexibility and strengthening excercises, supervised PT with diminished ADL's post treatment, use of assistive devices, weight reduction as appropriate and activity modification.  Onset of symptoms was gradual starting 3 years ago with gradually worsening course since that time.The patient noted no past surgery on the right hip(s).  Patient currently rates pain in the right hip at 10 out of 10 with activity. Patient has night pain, worsening of pain with activity and weight bearing, pain that interfers with activities of daily living, pain with passive range of motion and crepitus. Patient has evidence of subchondral sclerosis, periarticular osteophytes and joint space narrowing by imaging studies. This condition presents safety issues increasing the risk of falls.  There is no current active infection.  Patient Active Problem List   Diagnosis Date Noted  . Osteoarthritis of right hip 07/22/2015  . Acute blood loss anemia 01/04/2013  . Osteoarthritis of left knee 01/01/2013    Class: Chronic   Past Medical History  Diagnosis Date  . Hypertension   . GERD (gastroesophageal reflux disease)   . Hypercholesteremia     Crestor  . Diabetes mellitus without complication     borderline no med  . DVT of lower extremity (deep venous thrombosis) 2007    "LLE; from birth control pill" (01/01/2013)  . Arthritis     "left knee" (01/01/2013)  . Headache     pt. states migraines at times    Past Surgical History  Procedure Laterality Date  . Carpal tunnel release  2000    "right" (01/01/2013)  . Kidney donation   7  . Lipoma excision  2000    back  . Cesarean section  1978  . Replacement total knee  01/01/2013    "left" (01/01/2013)  . Anterior cervical decomp/discectomy fusion  2006  . Knee arthroplasty  01/01/2013    Procedure: COMPUTER ASSISTED TOTAL KNEE ARTHROPLASTY;  Surgeon: Kerrin Champagne, MD;  Location: [facility] OR;  Service: Orthopedics;  Laterality: Left;  Left computer assisted total knee replacement  . Colonoscopy    . Esophagogastroduodenoscopy      Prescriptions prior to admission  Medication Sig Dispense Refill Last Dose  . aspirin EC 81 MG tablet Take 81 mg by mouth at bedtime.   Past Week at Unknown time  . CALCIUM PO Take 1 tablet by mouth at bedtime.    Past Week at Unknown time  . cholecalciferol (VITAMIN D) 1000 UNITS tablet Take 1,000 Units by mouth at bedtime.   Past Week at Unknown time  . CRESTOR 10 MG tablet Take 10 mg by mouth at bedtime.  2 07/21/2015 at Unknown time  . diltiazem (CARDIZEM CD) 360 MG 24 hr capsule Take 360 mg by mouth at bedtime.    07/21/2015 at Unknown time  . RABEprazole (ACIPHEX) 20 MG tablet Take 20 mg by mouth at bedtime.    07/21/2015 at Unknown time  . traMADol (ULTRAM) 50 MG tablet Take 50 mg by mouth every 6 (six) hours as needed for moderate pain or severe pain.    Past Week at Unknown time  . HYDROcodone-acetaminophen (NORCO) 5-325 MG per tablet Take 2 tablets  by mouth every 4 (four) hours as needed. (Patient not taking: Reported on 07/09/2015) 6 tablet 0 Not Taking at Unknown time   Allergies  Allergen Reactions  . Penicillins Hives    History  Substance Use Topics  . Smoking status: Never Smoker   . Smokeless tobacco: Never Used  . Alcohol Use: No    History reviewed. No pertinent family history.   Review of Systems  Musculoskeletal: Positive for joint pain.  All other systems reviewed and are negative.   Objective:  Physical Exam  Constitutional: She is oriented to person, place, and time. She appears well-developed and well-nourished.   HENT:  Head: Normocephalic and atraumatic.  Eyes: EOM are normal. Pupils are equal, round, and reactive to light.  Neck: Normal range of motion. Neck supple.  Cardiovascular: Normal rate and regular rhythm.   Respiratory: Effort normal and breath sounds normal.  GI: Soft.  Musculoskeletal:       Right hip: She exhibits decreased range of motion, decreased strength, tenderness, bony tenderness and crepitus.  Neurological: She is alert and oriented to person, place, and time.  Skin: Skin is warm and dry.  Psychiatric: She has a normal mood and affect.    Vital signs in last 24 hours: Temp:  [98.1 F (36.7 C)] 98.1 F (36.7 C) (07/26 1032) Pulse Rate:  [66] 66 (07/26 1032) Resp:  [20] 20 (07/26 1032) BP: (142)/(75) 142/75 mmHg (07/26 1032) SpO2:  [99 %] 99 % (07/26 1032) Weight:  [105.235 kg (232 lb)] 105.235 kg (232 lb) (07/26 1046)  Labs:   Estimated body mass index is 40.45 kg/(m^2) as calculated from the following:   Height as of this encounter: 5' 3.5" (1.613 m).   Weight as of this encounter: 105.235 kg (232 lb).   Imaging Review Plain radiographs demonstrate severe degenerative joint disease of the right hip(s). The bone quality appears to be good for age and reported activity level.  Assessment/Plan:  End stage arthritis, right hip(s)  The patient history, physical examination, clinical judgement of the provider and imaging studies are consistent with end stage degenerative joint disease of the right hip(s) and total hip arthroplasty is deemed medically necessary. The treatment options including medical management, injection therapy, arthroscopy and arthroplasty were discussed at length. The risks and benefits of total hip arthroplasty were presented and reviewed. The risks due to aseptic loosening, infection, stiffness, dislocation/subluxation,  thromboembolic complications and other imponderables were discussed.  The patient acknowledged the explanation, agreed to  proceed with the plan and consent was signed. Patient is being admitted for inpatient treatment for surgery, pain control, PT, OT, prophylactic antibiotics, VTE prophylaxis, progressive ambulation and ADL's and discharge planning.The patient is planning to be discharged home with home health services

## 2015-07-22 NOTE — Transfer of Care (Signed)
Immediate Anesthesia Transfer of Care Note  Patient: Diana Bowers  Procedure(s) Performed: Procedure(s): RIGHT TOTAL HIP ARTHROPLASTY ANTERIOR APPROACH (Right)  Patient Location: PACU  Anesthesia Type:Spinal  Level of Consciousness: awake, alert  and oriented  Airway & Oxygen Therapy: Patient Spontanous Breathing  Post-op Assessment: Report given to RN and Post -op Vital signs reviewed and stable  Post vital signs: Reviewed and stable  Last Vitals:  Filed Vitals:   07/22/15 1032  BP: 142/75  Pulse: 66  Temp: 36.7 C  Resp: 20    Complications: No apparent anesthesia complications

## 2015-07-23 ENCOUNTER — Encounter (HOSPITAL_COMMUNITY): Payer: Self-pay | Admitting: Orthopaedic Surgery

## 2015-07-23 LAB — CBC
HEMATOCRIT: 33.6 % — AB (ref 36.0–46.0)
Hemoglobin: 11.1 g/dL — ABNORMAL LOW (ref 12.0–15.0)
MCH: 26.4 pg (ref 26.0–34.0)
MCHC: 33 g/dL (ref 30.0–36.0)
MCV: 79.8 fL (ref 78.0–100.0)
PLATELETS: 200 10*3/uL (ref 150–400)
RBC: 4.21 MIL/uL (ref 3.87–5.11)
RDW: 13.9 % (ref 11.5–15.5)
WBC: 9.4 10*3/uL (ref 4.0–10.5)

## 2015-07-23 LAB — BASIC METABOLIC PANEL
ANION GAP: 7 (ref 5–15)
BUN: 15 mg/dL (ref 6–20)
CALCIUM: 8.7 mg/dL — AB (ref 8.9–10.3)
CO2: 25 mmol/L (ref 22–32)
Chloride: 103 mmol/L (ref 101–111)
Creatinine, Ser: 1.08 mg/dL — ABNORMAL HIGH (ref 0.44–1.00)
GFR calc Af Amer: >60 mL/min (ref >60)
GFR, EST NON AFRICAN AMERICAN: 54 mL/min — AB (ref >60)
GLUCOSE: 130 mg/dL — AB (ref 65–99)
Potassium: 4.9 mmol/L (ref 3.5–5.1)
SODIUM: 135 mmol/L (ref 135–145)

## 2015-07-23 MED ORDER — PNEUMOCOCCAL VAC POLYVALENT 25 MCG/0.5ML IJ INJ
0.5000 mL | INJECTION | INTRAMUSCULAR | Status: AC
  Administered 2015-07-24: 0.5 mL via INTRAMUSCULAR
  Filled 2015-07-23: qty 0.5

## 2015-07-23 MED ORDER — SODIUM CHLORIDE 0.9 % IV BOLUS (SEPSIS)
500.0000 mL | Freq: Once | INTRAVENOUS | Status: AC
  Administered 2015-07-23: 500 mL via INTRAVENOUS

## 2015-07-23 NOTE — Care Management Utilization Note (Signed)
Utilization review completed. Teonia Yager, RN Case Manager [phone]. 

## 2015-07-23 NOTE — Progress Notes (Signed)
Occupational Therapy Evaluation Patient Details Name: Diana Bowers MRN: [id number] DOB: 1951/09/04 Today's Date: 07/23/2015    History of Present Illness Patient is a 64 y/o female s/p R TKA. PMH includes HTN, DVT, DM.   Clinical Impression   Patient presenting with decreased overall independence secondary to above. Patient independent PTA. Patient currently functioning at an overall min-mod assist level. Patient will benefit from acute OT to increase overall independence in the areas of ADLs, functional mobility, and overall safety in order to safely discharge home with husband.   Patient's 02 sats remained <90% on room air entire session.    Follow Up Recommendations  No OT follow up;Supervision/Assistance - 24 hour    Equipment Recommendations  None recommended by OT (Pt reports that she has a 3-in-1 she can use/borrow)    Recommendations for Other Services  None at this time   Precautions / Restrictions Precautions Precautions: Fall Precaution Comments: direct anterior approach Restrictions Weight Bearing Restrictions: Yes RLE Weight Bearing: Weight bearing as tolerated    Mobility Bed Mobility - Per PT note (pt in recliner upon OT entering room) Overal bed mobility: Needs Assistance Bed Mobility: Supine to Sit     Supine to sit: Mod assist;HOB elevated     General bed mobility comments: Assist to bring RLE to EOB, scoot hips to EOB using pad and elevate trunk. Heavy use of rails and step by step cues for technique.  Transfers Overall transfer level: Needs assistance Equipment used: Rolling walker (2 wheeled) Transfers: Sit to/from Stand Sit to Stand: Mod assist General transfer comment: Mod assist for sit<>stand from recliner. Cues for safety, hand placement, and sequencing.     Balance Overall balance assessment: Needs assistance Sitting-balance support: No upper extremity supported;Feet supported Sitting balance-Leahy Scale: Fair     Standing balance  support: Bilateral upper extremity supported;During functional activity Standing balance-Leahy Scale: Poor Standing balance comment: Relient on RW for support.     ADL Overall ADL's : Needs assistance/impaired General ADL Comments: Pt requires assistance with LB ADLs due to immobility and pain of RLE. Pt performed sit>stand from recliner with mod assist. Ambulated ~5 feet, then hand to ambulate backwards to recliner. Attempted to have pt ambulate into BR for toilet transfer, but due to increased pain pt unable. Encouraged patient to call for assistance when needing to use BR and have nursing staff bring [facility] to her for stand pivot transfer. Pts husband present and can provide supervision/assistance post acute d/c.     Pertinent Vitals/Pain Pain Assessment: 0-10 Pain Score: 6  Pain Location: RLE Pain Descriptors / Indicators: Sore;Aching Pain Intervention(s): Monitored during session;Limited activity within patient's tolerance;Repositioned     Hand Dominance Right   Extremity/Trunk Assessment Upper Extremity Assessment Upper Extremity Assessment: Overall WFL for tasks assessed   Lower Extremity Assessment Lower Extremity Assessment: Defer to PT evaluation RLE Deficits / Details: Limited AROM/strength secondary to pain/surgery. Able to perform LAQ. RLE Sensation:  [facility].)   Cervical / Trunk Assessment Cervical / Trunk Assessment: Normal   Communication Communication Communication: No difficulties   Cognition Arousal/Alertness: Awake/alert Behavior During Therapy: WFL for tasks assessed/performed Overall Cognitive Status: Within Functional Limits for tasks assessed              Home Living Family/patient expects to be discharged to:: Private residence Living Arrangements: Spouse/significant other Available Help at Discharge: Family;Available 24 hours/day Type of Home: House Home Access: Stairs to enter [employer] of Steps: 2 Entrance Stairs-Rails: None Home  Layout: One  level     Bathroom Shower/Tub: Walk-in shower;Door   Bathroom Toilet: Handicapped height     Home Equipment: Environmental consultant - 2 wheels;Cane - single point   Prior Functioning/Environment Level of Independence: Independent     OT Diagnosis: Generalized weakness;Acute pain   OT Problem List: Decreased strength;Decreased range of motion;Decreased activity tolerance;Impaired balance (sitting and/or standing);Decreased safety awareness;Pain;Decreased knowledge of use of DME or AE;Decreased knowledge of precautions   OT Treatment/Interventions: Self-care/ADL training;Therapeutic exercise;Energy conservation;DME and/or AE instruction;Therapeutic activities;Patient/family education;Balance training    OT Goals(Current goals can be found in the care plan section) Acute Rehab OT Goals Patient Stated Goal: to return to independence OT Goal Formulation: With patient/family Time For Goal Achievement: 08/06/15 Potential to Achieve Goals: Good ADL Goals Pt Will Perform Grooming: with modified independence;standing Pt Will Perform Lower Body Bathing: with modified independence;sit to/from stand;with adaptive equipment Pt Will Perform Lower Body Dressing: with modified independence;sit to/from stand;with adaptive equipment Pt Will Transfer to Toilet: with modified independence;ambulating;bedside commode Pt Will Perform Tub/Shower Transfer: Shower transfer;ambulating;3 in 1;rolling walker;with modified independence  OT Frequency: Min 2X/week   Barriers to D/C: None known at this time   End of Session Equipment Utilized During Treatment: Gait belt;Rolling walker  Activity Tolerance: Patient limited by pain Patient left: in chair;with call bell/phone within reach;with family/visitor present   Time: 1400-1415 OT Time Calculation (min): 15 min Charges:  OT General Charges $OT Visit: 1 Procedure OT Evaluation $Initial OT Evaluation Tier I: 1 Procedure  Jiro Kiester , MS, OTR/L,  CLT Pager: [phone]  07/23/2015, 3:19 PM

## 2015-07-23 NOTE — Evaluation (Signed)
Physical Therapy Evaluation Patient Details Name: SHALAINE PAYSON MRN: [id number] DOB: [birth year] Today's Date: 07/23/2015   History of Present Illness  Patient is a 64 y/o female s/p R THA, direct anterior approach. PMH includes HTN, DVT, DM.  Clinical Impression  Patient presents with pain, dizziness and post surgical deficits RLE s/p R THA. Instructed pt in exercises to perform. Tolerated short distance ambulation with Min A for balance/safety. BP stabilized. Will have support from spouse at home. Will plan for gait training in PM session as tolerated. Will follow acutely to maximize independence and mobility prior to return home.     Follow Up Recommendations Home health PT;Supervision/Assistance - 24 hour    Equipment Recommendations  None recommended by PT    Recommendations for Other Services OT consult     Precautions / Restrictions Precautions Precautions: Fall Precaution Comments: direct anterior approach Restrictions Weight Bearing Restrictions: Yes RLE Weight Bearing: Weight bearing as tolerated      Mobility  Bed Mobility Overal bed mobility: Needs Assistance Bed Mobility: Supine to Sit     Supine to sit: Mod assist;HOB elevated     General bed mobility comments: Assist to bring RLE to EOB, scoot hips to EOB using pad and elevate trunk. Heavy use of rails and step by step cues for technique.  Transfers Overall transfer level: Needs assistance Equipment used: Rolling walker (2 wheeled) Transfers: Sit to/from Stand Sit to Stand: Mod assist         General transfer comment: Mod A to boost from EOB x1 with cues for handplacement and technique. Difficulty transitioning LUE from bed to walker. Cues for upright posture.   Ambulation/Gait Ambulation/Gait assistance: Min assist Ambulation Distance (Feet): 7 Feet Assistive device: Rolling walker (2 wheeled) Gait Pattern/deviations: Decreased stance time - right;Decreased step length - left;Trunk flexed   Gait  velocity interpretation: <1.8 ft/sec, indicative of risk for recurrent falls General Gait Details: Slow, unsteady gait with increased WB through BUEs to offload RLE.  Stairs            Wheelchair Mobility    Modified Rankin (Stroke Patients Only)       Balance Overall balance assessment: Needs assistance Sitting-balance support: Feet supported;No upper extremity supported Sitting balance-Leahy Scale: Good     Standing balance support: During functional activity Standing balance-Leahy Scale: Poor Standing balance comment: Relient on RW for support.                              Pertinent Vitals/Pain Pain Assessment: 0-10 Pain Score: 7  Pain Location: right knee Pain Descriptors / Indicators: Sore;Aching Pain Intervention(s): Limited activity within patient's tolerance;Monitored during session;Repositioned    Home Living Family/patient expects to be discharged to:: Private residence Living Arrangements: Spouse/significant other Available Help at Discharge: Family;Available 24 hours/day Type of Home: House Home Access: Stairs to enter Entrance Stairs-Rails: None Entrance Stairs-Number of Steps: 2 Home Layout: One level Home Equipment: Walker - 2 wheels;Cane - single point      Prior Function Level of Independence: Independent               Hand Dominance   Dominant Hand: Right    Extremity/Trunk Assessment   Upper Extremity Assessment: Defer to OT evaluation           Lower Extremity Assessment: RLE deficits/detail RLE Deficits / Details: Limited AROM/strength secondary to pain/surgery. Able to perform LAQ.       Communication  Communication: No difficulties  Cognition Arousal/Alertness: Awake/alert Behavior During Therapy: WFL for tasks assessed/performed Overall Cognitive Status: Within Functional Limits for tasks assessed                      General Comments General comments (skin integrity, edema, etc.): BP 136/77  sitting EOB; BP post transfer to chair 115/62.    Exercises Total Joint Exercises Ankle Circles/Pumps: Both;10 reps;Supine Quad Sets: Both;10 reps;Supine Gluteal Sets: Both;10 reps;Supine      Assessment/Plan    PT Assessment Patient needs continued PT services  PT Diagnosis Difficulty walking;Acute pain   PT Problem List Decreased strength;Pain;Decreased range of motion;Decreased activity tolerance;Decreased balance;Decreased mobility  PT Treatment Interventions Balance training;Gait training;Stair training;Functional mobility training;Therapeutic activities;Therapeutic exercise;Patient/family education   PT Goals (Current goals can be found in the Care Plan section) Acute Rehab PT Goals Patient Stated Goal: to return to independence PT Goal Formulation: With patient Time For Goal Achievement: 08/06/15 Potential to Achieve Goals: Good    Frequency 7X/week   Barriers to discharge Inaccessible home environment 2 steps to enter home    Co-evaluation               End of Session Equipment Utilized During Treatment: Gait belt Activity Tolerance: Patient tolerated treatment well;Patient limited by pain Patient left: in chair;with call bell/phone within reach Nurse Communication: Mobility status         Time: 1610-9604 PT Time Calculation (min) (ACUTE ONLY): 27 min   Charges:   PT Evaluation $Initial PT Evaluation Tier I: 1 Procedure PT Treatments $Therapeutic Activity: 8-22 mins   PT G Codes:        Nashiya Disbrow A Aayla Marrocco 07/23/2015, 11:33 AM Mylo Red, PT, DPT [phone]

## 2015-07-23 NOTE — Progress Notes (Signed)
Rept to Dr. Magnus Ivan. Pt has run intermittent low grade temps today. Temps have come down with interventions. Pt's WBC normal this AM. No s/sx distress and no c/o such. No new orders at this time. Will continue to monitor.

## 2015-07-23 NOTE — Progress Notes (Signed)
PT Cancellation Note  Patient Details Name: Diana Bowers MRN: [id number] DOB: [birth year]   Cancelled Treatment:    Reason Eval/Treat Not Completed: Medical issues which prohibited therapy  RN requesting to hold therapy this AM due to symptomatic hypotension. Will follow up next available time.  Blake Divine A Evelise Reine 07/23/2015, 10:43 AM Mylo Red, PT, DPT [phone]

## 2015-07-23 NOTE — Progress Notes (Signed)
Physical Therapy Treatment Patient Details Name: Diana Bowers MRN: [id number] DOB: 03-25-1951 Today's Date: 07/23/2015    History of Present Illness Patient is a 64 y/o female s/p R TKA. PMH includes HTN, DVT, DM.    PT Comments    Patient progressing slowly towards PT goals. Fatigues quickly during ambulation. Difficulty advancing RLE during gait training. Instructed pt in HEP and provided handout. Will plan for stair training tomorrow as tolerated. Will follow acutely per current POC.   Follow Up Recommendations  Home health PT;Supervision/Assistance - 24 hour     Equipment Recommendations  None recommended by PT    Recommendations for Other Services       Precautions / Restrictions Precautions Precautions: Fall Precaution Comments: direct anterior approach Restrictions Weight Bearing Restrictions: Yes RLE Weight Bearing: Weight bearing as tolerated    Mobility  Bed Mobility Overal bed mobility: Needs Assistance Bed Mobility: Sit to Supine       Sit to supine: Mod assist   General bed mobility comments: Mod A to bring BLEs into bed. HOB flat, no use of rails.   Transfers Overall transfer level: Needs assistance Equipment used: Rolling walker (2 wheeled) Transfers: Sit to/from Stand Sit to Stand: Min assist         General transfer comment: Min A to boost from chair x2 progressing to Min guard assist.   Ambulation/Gait Ambulation/Gait assistance: Min assist Ambulation Distance (Feet): 22 Feet Assistive device: Rolling walker (2 wheeled) Gait Pattern/deviations: Step-to pattern;Decreased stance time - right;Decreased step length - left;Trunk flexed;Decreased stride length   Gait velocity interpretation: <1.8 ft/sec, indicative of risk for recurrent falls General Gait Details: Slow, unsteady gait. Difficulty advancing RLE requiring Min A to weight shift to the left. Dyspnea present. Sa02 95% on RA, HR 121 bpm.   Stairs            Wheelchair  Mobility    Modified Rankin (Stroke Patients Only)       Balance Overall balance assessment: Needs assistance Sitting-balance support: Feet supported;No upper extremity supported Sitting balance-Leahy Scale: Fair     Standing balance support: During functional activity Standing balance-Leahy Scale: Poor Standing balance comment: Relient on RW for support.                    Cognition Arousal/Alertness: Awake/alert Behavior During Therapy: WFL for tasks assessed/performed Overall Cognitive Status: Within Functional Limits for tasks assessed                      Exercises Total Joint Exercises Ankle Circles/Pumps: Both;10 reps;Supine Quad Sets: Both;10 reps;Supine Hip ABduction/ADduction: Right;10 reps;Supine    General Comments General comments (skin integrity, edema, etc.): Spouse present during session.      Pertinent Vitals/Pain Pain Assessment: 0-10 Pain Score: 5  Pain Location: RLE Pain Descriptors / Indicators: Sore;Aching Pain Intervention(s): Monitored during session;Repositioned    Home Living Family/patient expects to be discharged to:: Private residence Living Arrangements: Spouse/significant other Available Help at Discharge: Family;Available 24 hours/day Type of Home: House Home Access: Stairs to enter Entrance Stairs-Rails: None Home Layout: One level Home Equipment: Environmental consultant - 2 wheels;Cane - single point      Prior Function Level of Independence: Independent          PT Goals (current goals can now be found in the care plan section) Acute Rehab PT Goals Patient Stated Goal: to return to independence Progress towards PT goals: Progressing toward goals    Frequency  7X/week  PT Plan Current plan remains appropriate    Co-evaluation             End of Session Equipment Utilized During Treatment: Gait belt Activity Tolerance: Patient tolerated treatment well Patient left: in bed;with call bell/phone within  reach;with family/visitor present     Time: 1610-9604 PT Time Calculation (min) (ACUTE ONLY): 23 min  Charges:  $Gait Training: 8-22 mins $Therapeutic Activity: 8-22 mins                    G Codes:      Diana Bowers A Diana Bowers 07/23/2015, 3:54 PM  Diana Bowers, PT, DPT [phone]

## 2015-07-23 NOTE — Op Note (Signed)
NAMEJANIEL, Diana Bowers NO.:  [id number]  MEDICAL RECORD NO.:  [id number]  LOCATION:  [id number]                        FACILITY:  [facility]  PHYSICIAN:  Vanita Panda. Magnus Ivan, M.D.DATE OF BIRTH:  18-Jul-1951  DATE OF PROCEDURE:  07/22/2015 DATE OF DISCHARGE:                              OPERATIVE REPORT   POSTOPERATIVE DIAGNOSES:  Primary osteoarthritis and degenerative joint disease, right hip.  POSTOPERATIVE DIAGNOSES:  Primary osteoarthritis and degenerative joint disease, right hip.  PROCEDURE:  Right total hip arthroplasty with direct anterior approach.  IMPLANTS:  DePuy Sector Gription acetabular component size 48 with a single screw, size 32+ 0 neutral polyethylene liner, size 12 Corail femoral component with standard offset, size 32+ 1 ceramic hip ball.  SURGEON:  Vanita Panda. Magnus Ivan, M.D.  ASSISTING:  Richardean Canal, PA-C  ANESTHESIA:  Spinal.  ANTIBIOTICS:  900 mg IV clindamycin.  BLOOD LOSS:  300 mL.  COMPLICATIONS:  None.  INDICATIONS:  Diana Bowers is a very pleasant 64 year old female, with debilitating osteoarthritis, involving her right hip.  She has tried all forms of conservative treatment and has felt these.  Her x-rays show complete loss of the joint space, __________articular osteophytes, and sclerotic changes.  With fairly conservative treatment, her decreased mobility, her daily pain, and her decreased quality of life as well as activities of daily living, she wished to proceed with a total hip arthroplasty.  The risks of acute blood loss anemia, nerve and vessel injury, fracture, infection, dislocation, DVT involved and explained to her.  She understands the goals of decreased pain, improved mobility and overall improved quality of life.  DESCRIPTION OF PROCEDURE:  After informed consent was obtained, appropriate right hip was marked.  She was brought to the operating room.  Spinal anesthesia was obtained while she was on the  stretcher.  A Foley catheter was placed.  Next, she was placed supine on the Hana fracture table with a perineal post in place and both legs in an inline skeletal traction device, but no traction applied.  Her right operative hip was then prepped and draped with DuraPrep and sterile drapes.  Time- out was called and she was identified as correct patient and correct right hip.  We then made an incision inferior and posterior to the anterior superior iliac spine and carried this obliquely down the leg. We dissected down the tensor fascia lata muscle and then the tensor fascia was then divided longitudinally, so we could proceed with a direct anterior approach to the hip.  We identified and cauterized the lateral femoral circumflex vessels and then identified the hip capsule. We were able to place the Cobra Retractor down the lateral femoral neck and up underneath the rectus femoris on the medial femoral neck.  I then opened up the hip capsule __________ finding a mild effusion, but you could see there was significant periarticular osteophytes.  I placed the Cobra Retractors in the hip capsule medially and laterally and then was able to identify the femoral neck.  We excised some of the capsule and then used the oscillating saw to make our femoral neck cut proximal to the lesser trochanter and completed this on osteotome.  We placed a __________ guide in the femoral head and removed the femoral head in its entirety.  We then cleaned and we found a devoid of cartilage.  We then cleaned the acetabular rim and the acetabular labrum and we released the transverse acetabular ligament.  We were then able to start reaming under direct visualization and fluoroscopy from a size 42 reamer and 2 mm increments up to a size 48.  The last reamer was also placed under fluoroscopy, so we could obtain our depth of reaming, our inclination, and anteversion.  Once we are pleased with this, we placed a real  DePuy Sector Gription acetabular component size 48, a single screw and a 32+ 0 neutral positive liner for that size acetabular component.  Attention was then turned to the femur, with the leg externally rotated to 100 degrees, extended and adducted.  We were able to place a medial retractor medially and a Hohmann retractor behind the greater trochanter.  We released the lateral joint capsule and used a box cutting osteotome in the femoral canal and a rongeur to lateralize.  I then began broaching up to a size 12 broach, starting from a size 8, using the Corail femoral broaching system and with size 12 in place with a trial standard neck and a 32+ 1 hip ball rolling back over and up with traction and internal rotation, reduced in the pelvis.  We were pleased with the range of motion, stability, offset, and leg length.  We then dislocated the hip and removed the trial components.  We then placed the real Corail femoral component size 12 with standard offset and a real 32+ 1 ceramic hip ball.  Again, we reduced this in the acetabulum and it was stable.  We copiously irrigated the soft tissue with normal saline solution using pulsatile lavage.  We closed the joint capsule with interrupted #1 Ethibond suture, followed by a running #1 Vicryl in the tensor fascia, 2-0 Vicryl in the deep tissue, 2-0 Vicryl in subcutaneous tissue, and staples on the skin.  Xeroform, home Aquacel dressing was applied.  She was taken off the Hana table and taken to the recovery room in stable condition.  All final counts were correct and no complications noted.  Of note, Richardean Canal, PA-C, assisted in the entire case.  His assistance was crucial for facilitating all aspects of this case.     Vanita Panda. Magnus Ivan, M.D.     CYB/MEDQ  D:  07/22/2015  T:  07/23/2015  Job:  [id number]

## 2015-07-23 NOTE — Progress Notes (Signed)
Subjective: 1 Day Post-Op Procedure(s) (LRB): RIGHT TOTAL HIP ARTHROPLASTY ANTERIOR APPROACH (Right) Patient reports pain as moderate.  Some dizziness this AM.   Objective: Vital signs in last 24 hours: Temp:  [97.4 F (36.3 C)-99.2 F (37.3 C)] 99 F (37.2 C) (07/27 0519) Pulse Rate:  [60-100] 96 (07/27 0647) Resp:  [11-21] 16 (07/27 0519) BP: (83-142)/(48-78) 88/58 mmHg (07/27 0647) SpO2:  [96 %-100 %] 97 % (07/27 0647) Weight:  [105.235 kg (232 lb)] 105.235 kg (232 lb) (07/26 1046)  Intake/Output from previous day: 07/26 0701 - 07/27 0700 In: 2485 [I.V.:2235; IV Piggyback:250] Out: 1550 [Urine:1250; Blood:300] Intake/Output this shift:     Recent Labs  07/23/15 0458  HGB 11.1*    Recent Labs  07/23/15 0458  WBC 9.4  RBC 4.21  HCT 33.6*  PLT 200    Recent Labs  07/23/15 0458  NA 135  K 4.9  CL 103  CO2 25  BUN 15  CREATININE 1.08*  GLUCOSE 130*  CALCIUM 8.7*   No results for input(s): LABPT, INR in the last 72 hours.  Right lower extremity:  Sensation intact distally Intact pulses distally Dorsiflexion/Plantar flexion intact Incision: dressing C/D/I Compartment soft  Assessment/Plan: 1 Day Post-Op Procedure(s) (LRB): RIGHT TOTAL HIP ARTHROPLASTY ANTERIOR APPROACH (Right) Up with therapy  WBAT right hip. Hold diltiazem for systolic pressure less than 120 mm Hg. Monitor hemoglobin.   Richardean Canal 07/23/2015, 8:34 AM

## 2015-07-23 NOTE — Care Management Note (Signed)
Case Management Note  Patient Details  Name: Diana Bowers MRN: [id number] Date of Birth: 1951/12/19  Subjective/Objective:   64 yr old female s/p right hip arthroplasty.                 Action/Plan:  Case manager spoke with patient concerning home health and DME needs. Patient was preoperatively setup with [facility], no changes.  Patient states her husband is retired and will care for her at discharge. Patient states she has RW, 3in1 at home.    Expected Discharge Date:    07/24/15              Expected Discharge Plan:   Home with home health  In-House Referral:  NA  Discharge planning Services  CM Consult  Post Acute Care Choice:  Home Health  Choice offered to:  Patient  DME Arranged:    DME Agency:  [employer]  HH Arranged:  PT HH Agency:  [facility]  Status of Service:  Completed, signed off  Medicare Important Message Given:    Date Medicare IM Given:    Medicare IM give by:    Date Additional Medicare IM Given:    Additional Medicare Important Message give by:     If discussed at Long Length of Stay Meetings, dates discussed:    Additional Comments:  Durenda Guthrie, RN 07/23/2015, 11:08 AM

## 2015-07-24 ENCOUNTER — Encounter (HOSPITAL_COMMUNITY): Payer: Self-pay | Admitting: General Practice

## 2015-07-24 LAB — CBC
HCT: 32 % — ABNORMAL LOW (ref 36.0–46.0)
HEMOGLOBIN: 10.3 g/dL — AB (ref 12.0–15.0)
MCH: 26.5 pg (ref 26.0–34.0)
MCHC: 32.2 g/dL (ref 30.0–36.0)
MCV: 82.3 fL (ref 78.0–100.0)
Platelets: 142 10*3/uL — ABNORMAL LOW (ref 150–400)
RBC: 3.89 MIL/uL (ref 3.87–5.11)
RDW: 14.2 % (ref 11.5–15.5)
WBC: 12.9 10*3/uL — ABNORMAL HIGH (ref 4.0–10.5)

## 2015-07-24 NOTE — Progress Notes (Signed)
Physical Therapy Treatment Patient Details Name: Diana Bowers MRN: [id number] DOB: 04/02/1951 Today's Date: 07/24/2015    History of Present Illness Patient is a 64 y/o female s/p R THA, anterior approach. PMH includes HTN, DVT, DM.    PT Comments    Pt is progressing toward PT goals. Pt able to negotiate stairs backward with RW. Will go over stair training tomorrow with family member.  DC destination remains appropriate for pt to increase functional mobility. Will follow acutely if still in hospital.   Follow Up Recommendations  Home health PT;Supervision/Assistance - 24 hour     Equipment Recommendations  None recommended by PT    Recommendations for Other Services OT consult     Precautions / Restrictions Precautions Precautions: Fall Precaution Comments: direct anterior approach Restrictions Weight Bearing Restrictions: Yes RLE Weight Bearing: Weight bearing as tolerated    Mobility  Bed Mobility               General bed mobility comments: Pt in chair at start of treatment  Transfers Overall transfer level: Needs assistance Equipment used: Rolling walker (2 wheeled) Transfers: Sit to/from Stand Sit to Stand: Min guard         General transfer comment: min guard A  for safety, cues for hand placement and proper sit to stand technique  Ambulation/Gait                 Stairs Stairs: Yes Stairs assistance: Min guard Stair Management: No rails;Backwards;With walker Number of Stairs: 2 (X2) General stair comments: VC for walker placement going up stairs and to use hand railing going down stair.. Educated pt on where husban should stand to help for safety when negotiating stairs.  Wheelchair Mobility    Modified Rankin (Stroke Patients Only)       Balance Overall balance assessment: Needs assistance Sitting-balance support: No upper extremity supported;Feet supported Sitting balance-Leahy Scale: Good     Standing balance support:  Bilateral upper extremity supported;During functional activity Standing balance-Leahy Scale: Poor Standing balance comment: pt unsteady to stand without UE support                    Cognition Arousal/Alertness: Awake/alert Behavior During Therapy: WFL for tasks assessed/performed Overall Cognitive Status: Within Functional Limits for tasks assessed                      Exercises Total Joint Exercises Heel Slides: 10 reps;AROM;Seated    General Comments General comments (skin integrity, edema, etc.): states that she only has pain when moving envoled leg      Pertinent Vitals/Pain Pain Assessment: No/denies pain    Home Living                      Prior Function            PT Goals (current goals can now be found in the care plan section) Acute Rehab PT Goals Patient Stated Goal: none stated during session Progress towards PT goals: Progressing toward goals    Frequency  7X/week    PT Plan Current plan remains appropriate    Co-evaluation             End of Session Equipment Utilized During Treatment: Gait belt Activity Tolerance: Patient tolerated treatment well Patient left: with call bell/phone within reach;in chair     Time: 4098-1191 PT Time Calculation (min) (ACUTE ONLY): 18 min  Charges:  $Gait Training: 8-22  mins                    G Codes:      Carren Rang 2015-07-29, 3:56 PM   Dorrene German (student physical therapy assistant) Acute Rehab [phone]

## 2015-07-24 NOTE — Progress Notes (Signed)
Occupational Therapy Treatment Patient Details Name: Diana Bowers MRN: [id number] DOB: [birth year] Today's Date: 07/24/2015    History of present illness Patient is a 64 y/o female s/p R THA, anterior approach. PMH includes HTN, DVT, DM.   OT comments  Session focused on shower and toilet transfers. Pt required (A) to bring R foot over shower threshold; education provided on spouse (A) and use of 3in1 in shower for safety. Pt demonstrated ability to transfer from 3in1 with min guard for toileting. Pt requesting 3in1 for home. Pt continues to benefit from acute OT for increased safety and independence with ADLs prior to d/c home.    Follow Up Recommendations  No OT follow up;Supervision/Assistance - 24 hour    Equipment Recommendations  3 in 1 bedside comode;Other (comment) (Pt reports not having 3in1)    Recommendations for Other Services      Precautions / Restrictions Precautions Precautions: Fall Precaution Comments: direct anterior approach Restrictions Weight Bearing Restrictions: Yes RLE Weight Bearing: Weight bearing as tolerated       Mobility Bed Mobility Overal bed mobility: Needs Assistance Bed Mobility: Sit to Supine     Supine to sit: Mod assist;HOB elevated        Transfers Overall transfer level: Needs assistance Equipment used: Rolling walker (2 wheeled) Transfers: Sit to/from Stand Sit to Stand: Min guard              Balance Overall balance assessment: Needs assistance Sitting-balance support: No upper extremity supported;Feet supported       Standing balance support: Bilateral upper extremity supported;During functional activity                       ADL Overall ADL's : Needs assistance/impaired                         Toilet Transfer: Min guard;Ambulation;BSC   Toileting- Architect and Hygiene: Independent;Sit to/from stand   Tub/ Shower Transfer: Walk-in shower;Minimal assistance;Ambulation;3 in  1;Rolling walker Tub/Shower Transfer Details (indicate cue type and reason): Pt had difficulty lifting R leg over 5" threshold. Education provided on safety during transfer and having spouse (A) with shower transfer. Functional mobility during ADLs: Publishing rights manager     Praxis      Cognition   Behavior During Therapy: WFL for tasks assessed/performed Overall Cognitive Status: Within Functional Limits for tasks assessed                       Extremity/Trunk Assessment               Exercises     Shoulder Instructions       General Comments      Pertinent Vitals/ Pain       Pain Assessment: 0-10 Pain Score: 8  Pain Location: R LE Pain Descriptors / Indicators: Aching;Sore Pain Intervention(s): Monitored during session;Repositioned  Home Living Family/patient expects to be discharged to:: Private residence Living Arrangements: Spouse/significant other                                      Prior Functioning/Environment  Frequency Min 2X/week     Progress Toward Goals  OT Goals(current goals can now be found in the care plan section)  Progress towards OT goals: Progressing toward goals  Acute Rehab OT Goals Patient Stated Goal: to return to independence OT Goal Formulation: With patient/family Time For Goal Achievement: 08/06/15 Potential to Achieve Goals: Good ADL Goals Pt Will Perform Grooming: with modified independence;standing Pt Will Perform Lower Body Bathing: with modified independence;sit to/from stand;with adaptive equipment Pt Will Perform Lower Body Dressing: with modified independence;sit to/from stand;with adaptive equipment Pt Will Transfer to Toilet: with modified independence;ambulating;bedside commode Pt Will Perform Tub/Shower Transfer: Shower transfer;ambulating;3 in 1;rolling walker;with modified independence  Plan Discharge plan  remains appropriate    Co-evaluation                 End of Session Equipment Utilized During Treatment: Gait belt;Rolling walker   Activity Tolerance Patient limited by pain   Patient Left in chair;with call bell/phone within reach;with family/visitor present   Nurse Communication          Time: 0805-0820 OT Time Calculation (min): 15 min  Charges: OT General Charges $OT Visit: 1 Procedure OT Treatments $Self Care/Home Management : 8-22 mins  Marden Noble 07/24/2015, 10:06 AM

## 2015-07-24 NOTE — Progress Notes (Signed)
Physical Therapy Treatment Patient Details Name: Diana Bowers MRN: [id number] DOB: 05/12/1951 Today's Date: 07/24/2015    History of Present Illness Patient is a 64 y/o female s/p R THA, anterior approach. PMH includes HTN, DVT, DM.    PT Comments    Pt is progressing toward physical therapy goals. Pt showed increase ambulation tolerance today. Next session will attempt to negotiate stairs. Continue to recommend HHPT to improve functional mobility and independence.    Follow Up Recommendations  Home health PT;Supervision/Assistance - 24 hour     Equipment Recommendations  None recommended by PT    Recommendations for Other Services OT consult     Precautions / Restrictions Precautions Precautions: Fall Precaution Comments: direct anterior approach Restrictions Weight Bearing Restrictions: Yes RLE Weight Bearing: Weight bearing as tolerated    Mobility  Bed Mobility          General bed mobility comments: Pt in chair at start of treatment  Transfers Overall transfer level: Needs assistance Equipment used: Rolling walker (2 wheeled) Transfers: Sit to/from Stand Sit to Stand: Min guard         General transfer comment: min A for safety, cues for hand placement and proper sit to stand technique  Ambulation/Gait Ambulation/Gait assistance: Min assist Ambulation Distance (Feet): 40 Feet Assistive device: Rolling walker (2 wheeled) Gait Pattern/deviations: Step-to pattern;Antalgic;Trunk flexed;Narrow base of support;Decreased stride length Gait velocity: slow Gait velocity interpretation: Below normal speed for age/gender General Gait Details: VC for standing up right when ambulating, and to take rest breaks as needed for energy conservation. Pt demo unstady gt   Stairs            Wheelchair Mobility    Modified Rankin (Stroke Patients Only)       Balance Overall balance assessment: Needs assistance Sitting-balance support: No upper extremity  supported;Feet supported Sitting balance-Leahy Scale: Good     Standing balance support: Bilateral upper extremity supported;During functional activity Standing balance-Leahy Scale: Poor Standing balance comment: pt requires RW for standing balance due to fatigue and pain                    Cognition Arousal/Alertness: Awake/alert Behavior During Therapy: WFL for tasks assessed/performed Overall Cognitive Status: Within Functional Limits for tasks assessed                      Exercises Total Joint Exercises Long Arc Quad: AROM;10 reps;Both;Seated    General Comments General comments (skin integrity, edema, etc.): further ambulation distance today with no dizziness       Pertinent Vitals/Pain Pain Assessment: 0-10 Pain Score: 10-Worst pain ever Pain Location: RLE Pain Descriptors / Indicators: Aching;Burning;Sore Pain Intervention(s): Monitored during session    Home Living Family/patient expects to be discharged to:: Private residence Living Arrangements: Spouse/significant other                  Prior Function            PT Goals (current goals can now be found in the care plan section) Acute Rehab PT Goals Patient Stated Goal: none stated during session Progress towards PT goals: Progressing toward goals    Frequency  7X/week    PT Plan Current plan remains appropriate    Co-evaluation             End of Session Equipment Utilized During Treatment: Gait belt Activity Tolerance: Patient tolerated treatment well Patient left: in bed;with call bell/phone within reach;with family/visitor  present     Time: 1021-1037 PT Time Calculation (min) (ACUTE ONLY): 16 min  Charges:  $Gait Training: 8-22 mins                    G Codes:      Carren Rang 08-08-15, 12:16 PM  Dorrene German (student physical therapy assistant) Acute Rehab [phone]

## 2015-07-24 NOTE — Progress Notes (Signed)
Subjective: 2 Days Post-Op Procedure(s) (LRB): RIGHT TOTAL HIP ARTHROPLASTY ANTERIOR APPROACH (Right) Patient reports pain as moderate.  Asymptomatic acute blood loss anemia.  Slow progress with therapy.  Objective: Vital signs in last 24 hours: Temp:  [98.7 F (37.1 C)-101.5 F (38.6 C)] 98.7 F (37.1 C) (07/28 0520) Pulse Rate:  [98-113] 104 (07/28 0520) Resp:  [16-18] 18 (07/28 0520) BP: (105-121)/(53-69) 121/69 mmHg (07/28 0520) SpO2:  [95 %-100 %] 95 % (07/28 0520)  Intake/Output from previous day: 07/27 0701 - 07/28 0700 In: 1730 [P.O.:480; I.V.:1250] Out: 531 [Urine:531] Intake/Output this shift:     Recent Labs  07/23/15 0458 07/24/15 0455  HGB 11.1* 10.3*    Recent Labs  07/23/15 0458 07/24/15 0455  WBC 9.4 12.9*  RBC 4.21 3.89  HCT 33.6* 32.0*  PLT 200 142*    Recent Labs  07/23/15 0458  NA 135  K 4.9  CL 103  CO2 25  BUN 15  CREATININE 1.08*  GLUCOSE 130*  CALCIUM 8.7*   No results for input(s): LABPT, INR in the last 72 hours.  Sensation intact distally Intact pulses distally Dorsiflexion/Plantar flexion intact Incision: scant drainage  Assessment/Plan: 2 Days Post-Op Procedure(s) (LRB): RIGHT TOTAL HIP ARTHROPLASTY ANTERIOR APPROACH (Right) Up with therapy  Discharge likely tomorrow.  Diana Bowers Y 07/24/2015, 6:59 AM

## 2015-07-25 LAB — CBC
HCT: 29.9 % — ABNORMAL LOW (ref 36.0–46.0)
HEMOGLOBIN: 9.6 g/dL — AB (ref 12.0–15.0)
MCH: 26.1 pg (ref 26.0–34.0)
MCHC: 32.1 g/dL (ref 30.0–36.0)
MCV: 81.3 fL (ref 78.0–100.0)
Platelets: 151 10*3/uL (ref 150–400)
RBC: 3.68 MIL/uL — ABNORMAL LOW (ref 3.87–5.11)
RDW: 13.9 % (ref 11.5–15.5)
WBC: 10.6 10*3/uL — ABNORMAL HIGH (ref 4.0–10.5)

## 2015-07-25 MED ORDER — ASPIRIN 325 MG PO TBEC: 325.0000 mg | DELAYED_RELEASE_TABLET | Freq: Two times a day (BID) | ORAL | Status: DC

## 2015-07-25 MED ORDER — OXYCODONE-ACETAMINOPHEN 5-325 MG PO TABS: 1.0000 | ORAL_TABLET | ORAL | Status: DC | PRN

## 2015-07-25 MED ORDER — TIZANIDINE HCL 4 MG PO TABS: 4.0000 mg | ORAL_TABLET | Freq: Four times a day (QID) | ORAL | Status: DC | PRN

## 2015-07-25 NOTE — Progress Notes (Signed)
Patient ID: Diana Bowers, female   DOB: 09/16/1951, 64 y.o.   MRN: [id number] Doing very well.  Can be discharged to home today.

## 2015-07-25 NOTE — Progress Notes (Signed)
Physical Therapy Treatment Patient Details Name: ROSAN CALBERT MRN: [id number] DOB: 1951/10/12 Today's Date: Aug 19, 2015    History of Present Illness Patient is a 64 y/o female s/p R THA, anterior approach. PMH includes HTN, DVT, DM.    PT Comments    Was able to do stairs with pt's husband present. Instructed husband of what to do when ambulating steps. Pt plans to D/C today. Continue to recommend HHPT for ongoing rehab to increase functional independence.  Follow Up Recommendations  Home health PT;Supervision/Assistance - 24 hour     Equipment Recommendations  None recommended by PT    Recommendations for Other Services       Precautions / Restrictions Precautions Precautions: Fall Precaution Comments: direct anterior approach Restrictions Weight Bearing Restrictions: Yes RLE Weight Bearing: Weight bearing as tolerated    Mobility  Bed Mobility               General bed mobility comments: Pt found in chair and returned to chair after session.  Transfers Overall transfer level: Needs assistance Equipment used: Rolling walker (2 wheeled) Transfers: Sit to/from Stand Sit to Stand: Supervision         General transfer comment: Supervision for safety.  Ambulation/Gait Ambulation/Gait assistance: Min guard Ambulation Distance (Feet): 40 Feet Assistive device: Rolling walker (2 wheeled) Gait Pattern/deviations: Step-to pattern;Antalgic;Trunk flexed;Decreased stride length   Gait velocity interpretation: Below normal speed for age/gender General Gait Details: VCs for upright posture. Min G as pt is slightly unsteady with gait.   Stairs Stairs: Yes Stairs assistance: Min guard Stair Management: No rails;Backwards;With walker Number of Stairs: 2    Wheelchair Mobility    Modified Rankin (Stroke Patients Only)       Balance                                    Cognition Arousal/Alertness: Awake/alert Behavior During Therapy: WFL for  tasks assessed/performed Overall Cognitive Status: Within Functional Limits for tasks assessed                      Exercises Total Joint Exercises Quad Sets: 10 reps;Seated;Both Heel Slides: 10 reps;Seated;Right;AAROM Hip ABduction/ADduction: Right;10 reps;Seated;AROM Straight Leg Raises: AAROM;Right;10 reps;Seated    General Comments        Pertinent Vitals/Pain Pain Score: 6  Pain Location: R hip Pain Descriptors / Indicators: Sore;Burning;Aching Pain Intervention(s): Monitored during session    Home Living                      Prior Function            PT Goals (current goals can now be found in the care plan section) Progress towards PT goals: Progressing toward goals    Frequency  7X/week    PT Plan Current plan remains appropriate    Co-evaluation             End of Session Equipment Utilized During Treatment: Gait belt Activity Tolerance: Patient tolerated treatment well Patient left: with call bell/phone within reach;in chair;with family/visitor present     Time: 4098-1191 PT Time Calculation (min) (ACUTE ONLY): 16 min  Charges:                       G CodesNita Sells, SPTA 2015-08-19, 10:32 AM

## 2015-07-25 NOTE — Progress Notes (Signed)
Patient discharge home with family member. Discharge instructions given. No pain medication needed. Prescriptions given.

## 2015-07-25 NOTE — Discharge Instructions (Signed)

## 2015-07-25 NOTE — Discharge Summary (Signed)
Patient ID: Diana Bowers MRN: [id number] DOB/AGE: [birth year] 64 y.o.  Admit date: 07/22/2015 Discharge date: 07/25/2015  Admission Diagnoses:  Principal Problem:   Osteoarthritis of right hip Active Problems:   Status post total replacement of right hip   Discharge Diagnoses:  Same  Past Medical History  Diagnosis Date  . Hypertension   . GERD (gastroesophageal reflux disease)   . Hypercholesteremia     Crestor  . Diabetes mellitus without complication     borderline no med  . DVT of lower extremity (deep venous thrombosis) 2007    "LLE; from birth control pill" (01/01/2013)  . Arthritis     "left knee" (01/01/2013)  . Headache     pt. states migraines at times    Surgeries: Procedure(s): RIGHT TOTAL HIP ARTHROPLASTY ANTERIOR APPROACH on 07/22/2015   Consultants:    Discharged Condition: Improved  Hospital Course: Diana Bowers is an 64 y.o. female who was admitted 07/22/2015 for operative treatment ofOsteoarthritis of right hip. Patient has severe unremitting pain that affects sleep, daily activities, and work/hobbies. After pre-op clearance the patient was taken to the operating room on 07/22/2015 and underwent  Procedure(s): RIGHT TOTAL HIP ARTHROPLASTY ANTERIOR APPROACH.    Patient was given perioperative antibiotics: Anti-infectives    Start     Dose/Rate Route Frequency Ordered Stop   07/22/15 1830  clindamycin (CLEOCIN) IVPB 600 mg     600 mg 100 mL/hr over 30 Minutes Intravenous Every 6 hours 07/22/15 1431 07/23/15 0120   07/22/15 1037  clindamycin (CLEOCIN) 900 MG/50ML IVPB    Comments:  Diana Bowers, Diana Bowers   : cabinet override      07/22/15 1037 07/22/15 2244   07/22/15 1031  clindamycin (CLEOCIN) IVPB 900 mg     900 mg 100 mL/hr over 30 Minutes Intravenous On call to O.R. 07/22/15 1031 07/22/15 1242       Patient was given sequential compression devices, early ambulation, and chemoprophylaxis to prevent DVT.  Patient benefited maximally from hospital stay and  there were no complications.    Recent vital signs: Patient Vitals for the past 24 hrs:  BP Temp Temp src Pulse Resp SpO2  07/25/15 0615 110/76 mmHg 98.3 F (36.8 C) Oral 77 18 96 %  07/24/15 2219 - (!) 100.6 F (38.1 C) Oral - - -  07/24/15 2119 117/69 mmHg (!) 101 F (38.3 C) Oral 98 20 95 %  07/24/15 1314 118/63 mmHg 99.4 F (37.4 C) Oral 100 18 96 %     Recent laboratory studies:  Recent Labs  07/23/15 0458 07/24/15 0455 07/25/15 0428  WBC 9.4 12.9* 10.6*  HGB 11.1* 10.3* 9.6*  HCT 33.6* 32.0* 29.9*  PLT 200 142* 151  NA 135  --   --   K 4.9  --   --   CL 103  --   --   CO2 25  --   --   BUN 15  --   --   CREATININE 1.08*  --   --   GLUCOSE 130*  --   --   CALCIUM 8.7*  --   --      Discharge Medications:     Medication List    STOP taking these medications        HYDROcodone-acetaminophen 5-325 MG per tablet  Commonly known as:  NORCO      TAKE these medications        aspirin 325 MG EC tablet  Take 1 tablet (325 mg total)  by mouth 2 (two) times daily after a meal.     CALCIUM PO  Take 1 tablet by mouth at bedtime.     cholecalciferol 1000 UNITS tablet  Commonly known as:  VITAMIN D  Take 1,000 Units by mouth at bedtime.     CRESTOR 10 MG tablet  Generic drug:  rosuvastatin  Take 10 mg by mouth at bedtime.     diltiazem 360 MG 24 hr capsule  Commonly known as:  CARDIZEM CD  Take 360 mg by mouth at bedtime.     oxyCODONE-acetaminophen 5-325 MG per tablet  Commonly known as:  ROXICET  Take 1-2 tablets by mouth every 4 (four) hours as needed.     RABEprazole 20 MG tablet  Commonly known as:  ACIPHEX  Take 20 mg by mouth at bedtime.     tiZANidine 4 MG tablet  Commonly known as:  ZANAFLEX  Take 1 tablet (4 mg total) by mouth every 6 (six) hours as needed for muscle spasms.     traMADol 50 MG tablet  Commonly known as:  ULTRAM  Take 50 mg by mouth every 6 (six) hours as needed for moderate pain or severe pain.        Diagnostic  Studies: Dg Hip Port Unilat With Pelvis 1v Right  07/22/2015   CLINICAL DATA:  Status post right total hip  EXAM: DG HIP (WITH OR WITHOUT PELVIS) 1V PORT RIGHT  COMPARISON:  None.  FINDINGS: Hardware components of a right total hip arthroplasty are identified. The components are in anatomic alignment. No complications. Gas identified within the surrounding soft tissues.  IMPRESSION: 1. Status post right total hip arthroplasty.  No complications.   Electronically Signed   By: Diana Bowers M.D.   On: 07/22/2015 15:05   Dg Hip Operative Unilat With Pelvis Right  07/22/2015   CLINICAL DATA:  Fluoroscopic images from right hip replacement today.  EXAM: OPERATIVE RIGHT HIP (WITH PELVIS IF PERFORMED) 2 VIEWS  TECHNIQUE: Fluoroscopic spot image(s) were submitted for interpretation post-operatively.  COMPARISON:  None.  FINDINGS: Two fluoroscopic images are provided. Images demonstrate a right total hip arthroplasty in place. The device is located and no fracture is identified.  IMPRESSION: Right hip replacement without evidence of complication.   Electronically Signed   By: Diana Bowers M.D.   On: 07/22/2015 14:10    Disposition: 01-Home or Self Care      Discharge Instructions    Discharge patient    Complete by:  As directed            Follow-up Information    Follow up with Kathryne Hitch, MD In 2 weeks.   Specialty:  Orthopedic Surgery   Contact information:   [address] [place] [address] [phone]        Signed: Kathryne Hitch 07/25/2015, 6:52 AM

## 2016-02-12 ENCOUNTER — Other Ambulatory Visit: Payer: Self-pay

## 2016-02-12 DIAGNOSIS — Z1231 Encounter for screening mammogram for malignant neoplasm of breast: Secondary | ICD-10-CM

## 2016-03-29 ENCOUNTER — Ambulatory Visit
Admission: RE | Admit: 2016-03-29 | Discharge: 2016-03-29 | Disposition: A | Discharge status: Home / Self Care | Payer: Commercial Managed Care - HMO | Source: Ambulatory Visit

## 2016-03-29 DIAGNOSIS — Z1231 Encounter for screening mammogram for malignant neoplasm of breast: Secondary | ICD-10-CM

## 2016-11-05 ENCOUNTER — Ambulatory Visit (INDEPENDENT_AMBULATORY_CARE_PROVIDER_SITE_OTHER): Payer: Self-pay | Admitting: Specialist

## 2016-11-08 ENCOUNTER — Ambulatory Visit (INDEPENDENT_AMBULATORY_CARE_PROVIDER_SITE_OTHER): Payer: Commercial Managed Care - HMO

## 2016-11-08 ENCOUNTER — Ambulatory Visit (INDEPENDENT_AMBULATORY_CARE_PROVIDER_SITE_OTHER): Payer: Commercial Managed Care - HMO | Admitting: Orthopaedic Surgery

## 2016-11-08 DIAGNOSIS — M1612 Unilateral primary osteoarthritis, left hip: Secondary | ICD-10-CM

## 2016-11-08 DIAGNOSIS — M5442 Lumbago with sciatica, left side: Secondary | ICD-10-CM

## 2016-11-08 MED ORDER — GABAPENTIN 300 MG PO CAPS: 300.0000 mg | ORAL_CAPSULE | Freq: Every day | ORAL | 0 refills | Status: DC

## 2016-11-08 MED ORDER — METHYLPREDNISOLONE 4 MG PO TABS: ORAL_TABLET | ORAL | 0 refills | Status: DC

## 2016-11-08 NOTE — Progress Notes (Signed)
Office Visit Note   Patient: Diana Bowers           Date of Birth: 29-Jan-1951           MRN: [id number] Visit Date: 11/08/2016              Requested by: Caffie DammeKarla Smith, MD [address] [place], [place] [id number] PCP: Caffie DammeSMITH, KARLA, MD   Assessment & Plan: Visit Diagnoses:  1. Acute left-sided low back pain with left-sided sciatica   2. Unilateral primary osteoarthritis, left hip     Plan: Since I cannot try anti-inflammatories will ease put her on a six-day steroid taper especially since her blood glucose is been running good. I will also try Neurontin 300 mg to take at bedtime. I showed her back extension exercises. I would like see her back in about 3 weeks to see if she is improving.  Follow-Up Instructions: Return in about 6 months (around 05/08/2017).   Orders:  Orders Placed This Encounter  Procedures  . XR Lumbar Spine 2-3 Views   Meds ordered this encounter  Medications  . methylPREDNISolone (MEDROL) 4 MG tablet    Sig: Medrol dose pack. Take as instructed    Dispense:  21 tablet    Refill:  0  . gabapentin (NEURONTIN) 300 MG capsule    Sig: Take 1 capsule (300 mg total) by mouth at bedtime.    Dispense:  30 capsule    Refill:  0      Procedures: No procedures performed   Clinical Data: No additional findings.   Subjective: Chief Complaint  Patient presents with  . Left Hip - Follow-up    Left hip injection 08/25/16. But c/o a lot of left leg pain radiating down leg x4382mo; NKI  . Lower Back - Pain   She has been having pain on her left side radiating between her thigh down to her knee. She's had a history of a left knee replacement with her knee is not bothering her and she denies any groin pain. She does report sciatic type of pain the more pain is anterior. She denies a nubs and tingling in her feet. She is a newly diagnosed diabetic and is just taking metformin. She cannot take anti-inflammatories she only has one kidney. Her pain is waking her up at night  and is becoming irritating to her. Denies any weakness in her left leg. HPI  Review of Systems She denies any change in bowel or bladder function. She denies any chest pain, headache, shortness of breath, fever, chills.  Objective: Vital Signs: There were no vitals taken for this visit.  Physical Exam He is alert and oriented 3 in no acute distress Ortho Exam She has normal and painless active and passive motion of her left hip and left knee. She has normal sensation in all dermatomes. She has 5 out of 5 strength in all muscle groups. She has a negative straight leg raise to the left side or the right side. She has excellent flexion-extension of her lumbar spine but it does cause some pain. Specialty Comments:  No specialty comments available.  Imaging: Xr Lumbar Spine 2-3 Views  Result Date: 11/08/2016 2 views of the lumbar spine show no acute injury. She actually has good alignment in the AP and lateral planes of the normal lumbar lordosis. She does have loss of disc height at several levels suggesting an arthritic process.    PMFS History: Patient Active Problem List   Diagnosis Date  Noted  . Osteoarthritis of right hip 07/22/2015  . 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 Acute blood loss anemia 01/04/2013  . Osteoarthritis of left knee 01/01/2013    Class: Chronic   Past Medical History:  Diagnosis Date  . Arthritis    "left knee" (01/01/2013)  . Diabetes mellitus without complication    borderline no med  . DVT of lower extremity (deep venous thrombosis) 2007   "LLE; from birth control pill" (01/01/2013)  . GERD (gastroesophageal reflux disease)   . Headache    pt. states migraines at times  . Hypercholesteremia    Crestor  . Hypertension     No family history on file.  Past Surgical History:  Procedure Laterality Date  . ANTERIOR CERVICAL DECOMP/DISCECTOMY FUSION  2006  . CARPAL TUNNEL RELEASE  2000   "right" (01/01/2013)  . CESAREAN SECTION   1978  . COLONOSCOPY    . ESOPHAGOGASTRODUODENOSCOPY    . KIDNEY DONATION  1994  . KNEE ARTHROPLASTY  01/01/2013   Procedure: COMPUTER ASSISTED TOTAL KNEE ARTHROPLASTY;  Surgeon: Kerrin ChampagneJames E Nitka, MD;  Location: [facility] OR;  Service: Orthopedics;  Laterality: Left;  Left computer assisted total knee replacement  . LIPOMA EXCISION  2000   back  . REPLACEMENT TOTAL KNEE  01/01/2013   "left" (01/01/2013)  . TOTAL HIP ARTHROPLASTY Right 07/22/2015   Procedure: RIGHT TOTAL HIP ARTHROPLASTY ANTERIOR APPROACH;  Surgeon: Kathryne Hitchhristopher Y Blackman, MD;  [facility];  Service: Orthopedics;  Laterality: Right;   Social History   Occupational History  . Not on file.   Social History Main Topics  . Smoking status: Never Smoker  . Smokeless tobacco: Never Used  . Alcohol use No  . Drug use: No  . Sexual activity: Yes

## 2016-11-29 ENCOUNTER — Ambulatory Visit (INDEPENDENT_AMBULATORY_CARE_PROVIDER_SITE_OTHER): Payer: Commercial Managed Care - HMO | Admitting: Orthopaedic Surgery

## 2016-11-30 ENCOUNTER — Ambulatory Visit (INDEPENDENT_AMBULATORY_CARE_PROVIDER_SITE_OTHER): Payer: Commercial Managed Care - HMO | Admitting: Orthopaedic Surgery

## 2016-11-30 DIAGNOSIS — M25552 Pain in left hip: Secondary | ICD-10-CM | POA: Diagnosis not present

## 2016-11-30 MED ORDER — TRAMADOL HCL 50 MG PO TABS: 50.0000 mg | ORAL_TABLET | Freq: Two times a day (BID) | ORAL | 0 refills | Status: DC | PRN

## 2016-11-30 NOTE — Progress Notes (Signed)
The patient is here for follow-up of her hip and her back. She says definitely not her back and she feels like it's her hip on the left side. She is been having some groin pain. She says actually with walking and the anti-inflammatories and tramadol she's got significantly better.  On examination of her left hip I can put it through internal right rotation Phoebe PerchHirsch is a little bit and some of the trochanteric area but not nearly what she was hurting before. Her straight leg raise is negative. She states she getting out of a chair easily and ambulating easier as well.  At this standpoint she'll follow-up as needed since she is doing so well. I next step for her would be an intra-articular injection in her left hip. We did review her spine films from her last visit and he can see the hip ball-and-socket on her left side. She's had a right total hip arthroplasty on that side looked good the left hip showed adequate joint space.

## 2016-12-05 ENCOUNTER — Other Ambulatory Visit (INDEPENDENT_AMBULATORY_CARE_PROVIDER_SITE_OTHER): Payer: Self-pay | Admitting: Orthopaedic Surgery

## 2017-01-03 ENCOUNTER — Other Ambulatory Visit (INDEPENDENT_AMBULATORY_CARE_PROVIDER_SITE_OTHER): Payer: Self-pay | Admitting: Orthopaedic Surgery

## 2017-01-03 NOTE — Telephone Encounter (Signed)
Please advise 

## 2017-02-25 ENCOUNTER — Other Ambulatory Visit: Payer: Self-pay | Admitting: Internal Medicine

## 2017-02-25 DIAGNOSIS — Z1231 Encounter for screening mammogram for malignant neoplasm of breast: Secondary | ICD-10-CM

## 2017-03-01 ENCOUNTER — Ambulatory Visit (INDEPENDENT_AMBULATORY_CARE_PROVIDER_SITE_OTHER): Payer: Commercial Managed Care - HMO | Admitting: Orthopaedic Surgery

## 2017-03-02 ENCOUNTER — Ambulatory Visit (INDEPENDENT_AMBULATORY_CARE_PROVIDER_SITE_OTHER): Payer: Commercial Managed Care - HMO | Admitting: Orthopaedic Surgery

## 2017-03-02 ENCOUNTER — Ambulatory Visit (INDEPENDENT_AMBULATORY_CARE_PROVIDER_SITE_OTHER): Payer: Commercial Managed Care - HMO | Admitting: Physician Assistant

## 2017-03-02 DIAGNOSIS — M25552 Pain in left hip: Secondary | ICD-10-CM

## 2017-03-02 MED ORDER — TRAMADOL HCL 50 MG PO TABS: 50.0000 mg | ORAL_TABLET | Freq: Two times a day (BID) | ORAL | 0 refills | Status: DC | PRN

## 2017-03-02 NOTE — Progress Notes (Signed)
Office Visit Note   Patient: Diana Bowers           Date of Birth: 1951/08/16           MRN: [id number] Visit Date: 03/02/2017              Requested by: Caffie Damme, MD [address] [place], [place] [address] PCP: Caffie Damme, MD   Assessment & Plan: Visit Diagnoses: Left hip pain  Plan: We will send her for an intra-articular injection of her left hip for diagnostic and hopefully therapeutic purposes. We will see her back up nicely 4 weeks after the intra-articular injection see how well she is doing. Call her in some tramadol for pain. Otherwise she is activities as tolerated  Follow-Up Instructions: Return in about 4 weeks (around 03/30/2017).   Orders:  No orders of the defined types were placed in this encounter.  No orders of the defined types were placed in this encounter.     Procedures: No procedures performed   Clinical Data: No additional findings.   Subjective: Left hip pain  HPI Diana Bowers is well-known Dr. Eliberto Ivory service comes in today due to increasing left hip pain. She states her hip is bothering her a lot. She does like to walk 2 miles a day for exercise but is having more more lateral left hip pain and deep groin pain with ambulation. She does have pain whenever she lies on the hip at night. Denies any numbness tingling down the left leg. Right hip for which she is status post right total hip arthroplasty 07/22/2015 is doing well. Review of Systems   Objective: Vital Signs: There were no vitals taken for this visit.  Physical Exam  Constitutional: She is oriented to person, place, and time. She appears well-developed and well-nourished.  Neurological: She is alert and oriented to person, place, and time.  Psychiatric: She has a normal mood and affect. Her behavior is normal.    Ortho Exam Bilateral hips, right hip excellent range of motion without pain. Left hip she has limited internal rotation has pain with internal and external rotation.  Logrolling causes no pain in either hip. Does guard the left hip with attempts at range of motion. Tenderness over left trochanteric region. Specialty Comments:  No specialty comments available.  Imaging: No results found.   PMFS History: Patient Active Problem List   Diagnosis Date Noted  . Osteoarthritis of right hip 07/22/2015  . 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 Acute blood loss anemia 01/04/2013  . Osteoarthritis of left knee 01/01/2013    Class: Chronic   Past Medical History:  Diagnosis Date  . Arthritis    "left knee" (01/01/2013)  . Diabetes mellitus without complication    borderline no med  . DVT of lower extremity (deep venous thrombosis) 2007   "LLE; from birth control pill" (01/01/2013)  . GERD (gastroesophageal reflux disease)   . Headache    pt. states migraines at times  . Hypercholesteremia    Crestor  . Hypertension     No family history on file.  Past Surgical History:  Procedure Laterality Date  . ANTERIOR CERVICAL DECOMP/DISCECTOMY FUSION  2006  . CARPAL TUNNEL RELEASE  2000   "right" (01/01/2013)  . CESAREAN SECTION  1978  . COLONOSCOPY    . ESOPHAGOGASTRODUODENOSCOPY    . KIDNEY DONATION  1994  . KNEE ARTHROPLASTY  01/01/2013   Procedure: COMPUTER ASSISTED TOTAL KNEE ARTHROPLASTY;  Surgeon: Fayrene Fearing  Lynne LoganE Nitka, MD;  Location: [facility] OR;  Service: Orthopedics;  Laterality: Left;  Left computer assisted total knee replacement  . LIPOMA EXCISION  2000   back  . REPLACEMENT TOTAL KNEE  01/01/2013   "left" (01/01/2013)  . TOTAL HIP ARTHROPLASTY Right 07/22/2015   Procedure: RIGHT TOTAL HIP ARTHROPLASTY ANTERIOR APPROACH;  Surgeon: Kathryne Hitchhristopher Y Blackman, MD;  [facility];  Service: Orthopedics;  Laterality: Right;   Social History   Occupational History  . Not on file.   Social History Main Topics  . Smoking status: Never Smoker  . Smokeless tobacco: Never Used  . Alcohol use No  . Drug use: No  . Sexual activity: Yes

## 2017-03-02 NOTE — Addendum Note (Signed)
Addended by: Donalee CitrinPEELE, Nevae Pinnix L on: 03/02/2017 10:01 AM   Modules accepted: Orders

## 2017-03-03 ENCOUNTER — Encounter (INDEPENDENT_AMBULATORY_CARE_PROVIDER_SITE_OTHER): Payer: Self-pay | Admitting: Physical Medicine and Rehabilitation

## 2017-03-03 ENCOUNTER — Ambulatory Visit (INDEPENDENT_AMBULATORY_CARE_PROVIDER_SITE_OTHER): Payer: Commercial Managed Care - HMO | Admitting: Physical Medicine and Rehabilitation

## 2017-03-03 ENCOUNTER — Ambulatory Visit (INDEPENDENT_AMBULATORY_CARE_PROVIDER_SITE_OTHER): Payer: Self-pay

## 2017-03-03 VITALS — BP 128/75 | HR 72

## 2017-03-03 DIAGNOSIS — M25552 Pain in left hip: Secondary | ICD-10-CM

## 2017-03-03 NOTE — Progress Notes (Signed)
Diana Bowers - 66 y.o. female MRN [id number]  Date of birth: 30-Mar-1951  Office Visit Note: Visit Date: 03/03/2017 PCP: Caffie DammeSMITH, KARLA, MD Referred by: Caffie DammeSmith, Karla, MD  Subjective: Chief Complaint  Patient presents with  . Left Hip - Pain   HPI: Diana Bowers is a 66 year old female with Left hip and groin pain for several months and worse recently. Worse when getting up from seated or laying position. States thigh aches all the time and ecpecially at night with laying down. She is followed by Dr. Magnus IvanBlackman. She had prior right total hip arthroplasty.    ROS Otherwise per HPI.  Assessment & Plan: Visit Diagnoses:  1. Pain in left hip     Plan: Findings:  Diagnostic and therapeutic left hip anesthetic arthrogram. Patient did get relief during the anesthetic phase.    Meds & Orders: No orders of the defined types were placed in this encounter.   Orders Placed This Encounter  Procedures  . Large Joint Injection/Arthrocentesis  . XR C-ARM NO REPORT    Follow-up: Return for scheduled follow up with Milas KocherGil Clarke,PA.   Procedures: Large Joint Inj Date/Time: 03/03/2017 9:14 AM Performed by: Tyrell AntonioNEWTON, Haani Bakula Authorized by: Tyrell AntonioNEWTON, Lanay Zinda   Consent Given by:  Patient Site marked: the procedure site was marked   Timeout: prior to procedure the correct patient, procedure, and site was verified   Indications:  Pain and diagnostic evaluation Location:  Hip Site:  L hip joint Prep: patient was prepped and draped in usual sterile fashion   Needle Size:  22 G Needle length (in): 5.0 inches. Approach:  Anterior Ultrasound Guidance: No   Fluoroscopic Guidance: No   Arthrogram: Yes   Medications:  80 mg triamcinolone acetonide 40 MG/ML; 3 mL bupivacaine 0.5 % Aspiration Attempted: Yes   Patient tolerance:  Patient tolerated the procedure well with no immediate complications  Arthrogram demonstrated excellent flow of contrast throughout the joint surface without extravasation or obvious  defect.  The patient had relief of symptoms during the anesthetic phase of the injection.     No notes on file   Clinical History: No specialty comments available.  She reports that she has never smoked. She has never used smokeless tobacco. No results for input(s): HGBA1C, LABURIC in the last 8760 hours.  Objective:  VS:  HT:    WT:   BMI:     BP:128/75  HR:72bpm  TEMP: ( )  RESP:  Physical Exam  Musculoskeletal:  Painful range of motion of the left hip particularly with internal rotation.    Ortho Exam Imaging: Xr C-arm No Report  Result Date: 03/03/2017 Please see Notes or Procedures tab for imaging impression.   Past Medical/Family/Surgical/Social History: Medications & Allergies reviewed per EMR Patient Active Problem List   Diagnosis Date Noted  . Osteoarthritis of right hip 07/22/2015  . 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 Acute blood loss anemia 01/04/2013  . Osteoarthritis of left knee 01/01/2013    Class: Chronic   Past Medical History:  Diagnosis Date  . Arthritis    "left knee" (01/01/2013)  . Diabetes mellitus without complication (HCC)    borderline no med  . DVT of lower extremity (deep venous thrombosis) (HCC) 2007   "LLE; from birth control pill" (01/01/2013)  . GERD (gastroesophageal reflux disease)   . Headache    pt. states migraines at times  . Hypercholesteremia    Crestor  . Hypertension    History reviewed. No pertinent  family history. Past Surgical History:  Procedure Laterality Date  . ANTERIOR CERVICAL DECOMP/DISCECTOMY FUSION  2006  . CARPAL TUNNEL RELEASE  2000   "right" (01/01/2013)  . CESAREAN SECTION  1978  . COLONOSCOPY    . ESOPHAGOGASTRODUODENOSCOPY    . KIDNEY DONATION  1994  . KNEE ARTHROPLASTY  01/01/2013   Procedure: COMPUTER ASSISTED TOTAL KNEE ARTHROPLASTY;  Surgeon: Kerrin Champagne, MD;  Location: [facility] OR;  Service: Orthopedics;  Laterality: Left;  Left computer assisted total knee replacement  . LIPOMA  EXCISION  2000   back  . REPLACEMENT TOTAL KNEE  01/01/2013   "left" (01/01/2013)  . TOTAL HIP ARTHROPLASTY Right 07/22/2015   Procedure: RIGHT TOTAL HIP ARTHROPLASTY ANTERIOR APPROACH;  Surgeon: Kathryne Hitch, MD;  Location: [facility] OR;  Service: Orthopedics;  Laterality: Right;   Social History   Occupational History  . Not on file.   Social History Main Topics  . Smoking status: Never Smoker  . Smokeless tobacco: Never Used  . Alcohol use No  . Drug use: No  . Sexual activity: Yes

## 2017-03-03 NOTE — Patient Instructions (Signed)

## 2017-03-04 MED ORDER — TRIAMCINOLONE ACETONIDE 40 MG/ML IJ SUSP
80.0000 mg | INTRAMUSCULAR | Status: AC | PRN
  Administered 2017-03-03: 80 mg via INTRA_ARTICULAR

## 2017-03-04 MED ORDER — BUPIVACAINE HCL 0.5 % IJ SOLN
3.0000 mL | INTRAMUSCULAR | Status: AC | PRN
  Administered 2017-03-03: 3 mL via INTRA_ARTICULAR

## 2017-03-30 ENCOUNTER — Ambulatory Visit
Admission: RE | Admit: 2017-03-30 | Discharge: 2017-03-30 | Disposition: A | Discharge status: Home / Self Care | Payer: Commercial Managed Care - HMO | Source: Ambulatory Visit | Attending: Internal Medicine | Admitting: Internal Medicine

## 2017-03-30 DIAGNOSIS — Z1231 Encounter for screening mammogram for malignant neoplasm of breast: Secondary | ICD-10-CM

## 2017-03-31 ENCOUNTER — Ambulatory Visit (INDEPENDENT_AMBULATORY_CARE_PROVIDER_SITE_OTHER): Payer: Commercial Managed Care - HMO | Admitting: Specialist

## 2017-04-01 ENCOUNTER — Ambulatory Visit (INDEPENDENT_AMBULATORY_CARE_PROVIDER_SITE_OTHER): Payer: Commercial Managed Care - HMO | Admitting: Physician Assistant

## 2017-04-01 DIAGNOSIS — M1612 Unilateral primary osteoarthritis, left hip: Secondary | ICD-10-CM | POA: Diagnosis not present

## 2017-04-01 NOTE — Progress Notes (Deleted)
------------                                                                                             3  ---------------------                                                                                             Office Visit Note   Patient: Diana Bowers           Date of Birth: 16-Dec-1951           MRN: [id number] Visit Date: 04/01/2017              Requested by: Caffie Damme, MD [address] [place], [place] [address] PCP: Caffie Damme, MD   Assessment & Plan: Visit Diagnoses:  1. Primary osteoarthritis of left hip     Plan: ***  Follow-Up Instructions: Return in about 6 months (around 10/01/2017).   Orders:  No orders of the defined types were placed in this encounter.  No orders of the defined types were placed in this encounter.     Procedures: No procedures performed   Clinical Data: No additional findings.   Subjective: No chief complaint on file.   HPI  Review of Systems   Objective: Vital Signs: There were no vitals taken for this visit.  Physical Exam  Ortho Exam  Specialty Comments:  No specialty comments available.  Imaging: No results found.   PMFS History: Patient Active Problem List   Diagnosis Date Noted  . Osteoarthritis of right hip 07/22/2015  . 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 Acute blood loss anemia 01/04/2013  . Osteoarthritis of left knee 01/01/2013    Class: Chronic   Past Medical History:  Diagnosis Date  . Arthritis    "left knee" (01/01/2013)  . Diabetes mellitus without complication (HCC)    borderline no med  . DVT of lower extremity (deep venous thrombosis) (HCC) 2007   "LLE; from birth control pill" (01/01/2013)  . GERD (gastroesophageal reflux disease)   . Headache    pt.  states migraines at times  . Hypercholesteremia    Crestor  . Hypertension     No family history on file.  Past Surgical History:  Procedure Laterality Date  . ANTERIOR CERVICAL DECOMP/DISCECTOMY FUSION  2006  . CARPAL TUNNEL RELEASE  2000   "right" (01/01/2013)  . CESAREAN SECTION  1978  . COLONOSCOPY    . ESOPHAGOGASTRODUODENOSCOPY    . KIDNEY DONATION  1994  . KNEE ARTHROPLASTY  01/01/2013   Procedure: COMPUTER ASSISTED TOTAL KNEE ARTHROPLASTY;  Surgeon: Kerrin Champagne, MD;  Location: [facility] OR;  Service: Orthopedics;  Laterality: Left;  Left computer assisted total knee replacement  . LIPOMA EXCISION  2000   back  . REPLACEMENT TOTAL KNEE  01/01/2013   "left" (01/01/2013)  . TOTAL HIP ARTHROPLASTY Right 07/22/2015   Procedure: RIGHT TOTAL HIP ARTHROPLASTY ANTERIOR APPROACH;  Surgeon: Kathryne Hitch, MD;  Location: [facility] OR;  Service: Orthopedics;  Laterality: Right;   Social History   Occupational History  . Not on file.   Social History Main Topics  . Smoking status: Never Smoker  . Smokeless tobacco: Never Used  . Alcohol use No  . Drug use: No  . Sexual activity: Yes

## 2017-04-01 NOTE — Progress Notes (Signed)
Diana Bowers returns today status post left hip intra-articular and injection by Dr. Alvester Morin 03/03/2017. She states the injection was very helpful she's having no significant pain in the hip. This point time she did like to see how the injection does for her and how long it lasts time. She states if in fact the hip pain returns quickly and to the extent that it was prior to the injection and she would most likely like to proceed with a left total hip arthroplasty. She's had a right total hip July 2016 in the right hip is done well.  Physical exam:  Gen.: Alert and oriented 3 . No acute distress. Left hip excellent range of motion without pain today. She ambulates without any assistive devices and without an antalgic gait.  Plan :We'll see her back on an as-needed basis.

## 2017-06-19 ENCOUNTER — Encounter (HOSPITAL_BASED_OUTPATIENT_CLINIC_OR_DEPARTMENT_OTHER): Payer: Self-pay | Admitting: Emergency Medicine

## 2017-06-19 ENCOUNTER — Emergency Department (HOSPITAL_BASED_OUTPATIENT_CLINIC_OR_DEPARTMENT_OTHER)
Admission: EM | Admit: 2017-06-19 | Discharge: 2017-06-19 | Disposition: A | Discharge status: Home / Self Care | Payer: 59 | Attending: Emergency Medicine | Admitting: Emergency Medicine

## 2017-06-19 DIAGNOSIS — Z96652 Presence of left artificial knee joint: Secondary | ICD-10-CM | POA: Insufficient documentation

## 2017-06-19 DIAGNOSIS — E119 Type 2 diabetes mellitus without complications: Secondary | ICD-10-CM | POA: Insufficient documentation

## 2017-06-19 DIAGNOSIS — I10 Essential (primary) hypertension: Secondary | ICD-10-CM | POA: Insufficient documentation

## 2017-06-19 DIAGNOSIS — Z79899 Other long term (current) drug therapy: Secondary | ICD-10-CM | POA: Diagnosis not present

## 2017-06-19 DIAGNOSIS — Z96641 Presence of right artificial hip joint: Secondary | ICD-10-CM | POA: Diagnosis not present

## 2017-06-19 DIAGNOSIS — M79652 Pain in left thigh: Secondary | ICD-10-CM | POA: Diagnosis present

## 2017-06-19 DIAGNOSIS — R52 Pain, unspecified: Secondary | ICD-10-CM

## 2017-06-19 DIAGNOSIS — Z7982 Long term (current) use of aspirin: Secondary | ICD-10-CM | POA: Diagnosis not present

## 2017-06-19 MED ORDER — ENOXAPARIN SODIUM 100 MG/ML ~~LOC~~ SOLN
SUBCUTANEOUS | Status: AC
  Administered 2017-06-19: 90 mg
  Filled 2017-06-19: qty 1

## 2017-06-19 MED ORDER — ENOXAPARIN SODIUM 150 MG/ML ~~LOC~~ SOLN
1.0000 mg/kg | Freq: Once | SUBCUTANEOUS | Status: AC
  Administered 2017-06-19: 90 mg via SUBCUTANEOUS

## 2017-06-19 MED ORDER — HYDROCODONE-ACETAMINOPHEN 5-325 MG PO TABS
2.0000 | ORAL_TABLET | Freq: Once | ORAL | Status: AC
  Administered 2017-06-19: 2 via ORAL
  Filled 2017-06-19: qty 2

## 2017-06-19 NOTE — ED Triage Notes (Addendum)
PT presents with c/o left inner leg and inner thigh pain all day today. PT reports no injury to leg.

## 2017-06-19 NOTE — Discharge Instructions (Addendum)
IMPORTANT PATIENT INSTRUCTIONS:  You have been scheduled for an Outpatient Ultrasound.    Your appointment has been scheduled for 06/20/17  If your appointment is scheduled for a Saturday, Sunday or holiday, please go to the [facility] Registration Desk at least 15 minutes prior to your appointment time and tell them you are there for an ultrasound.    If your appointment is scheduled for a weekday (Monday - Friday), please go directly to the [facility] reception area at least 15 minutes prior to your appointment time and tell them you are there for an ultrasound.  Please call [phone] with questions.

## 2017-06-19 NOTE — ED Notes (Signed)
Alert, NAD, calm, interactive, resps e/u, speaking in clear complete sentences, no dyspnea noted, skin W&D, VSS, c/o L anterior and medial thigh pain, radiates to knee, also some hip pain, (denies: back pain, urinary or vaginal sx, CP, sob, fever, NVD, numbness or tingling, dizziness or visual changes). [facility] at [facility].  Reports recent 1.5 hr bus trip. H/o L leg DVT, was on warfarin for 6 months (no longer taking). Denies CP or sob. BLE warm, CMS intact, ppp, compartments soft, no obvious swelling or bruising. ROM limited in LLE d/t pain.

## 2017-06-19 NOTE — ED Provider Notes (Signed)
[facility] Provider Note   CSN: [id number] Arrival date & time: 06/19/17  2051  By signing my name below, I, Diana Bowers, attest that this documentation has been prepared under the direction and in the presence of Pricilla LovelessGoldston, Lorriane Dehart, MD. Electronically Signed: Cynda AcresHailei Bowers, Scribe. 06/19/17. 11:15 PM.  History   Chief Complaint Chief Complaint  Patient presents with  . Leg Pain    HPI Comments: Diana Bowers is a 66 y.o. female with a history of DVT (warfarin, last taken in 2007), who presents to the Emergency Department complaining of sudden-onset, intermittent left inner leg pain that began this morning. Patient states she suddenly began having sharp, severe left inner thigh pain. Patient reports a history of a blood clot in the same leg which she developed from birth control (over 10 years ago), her pain is similar. Patient reports associated left ankle swelling. Patient reports taking two tramadol which she is prescribed with no relief this morning. No further meds since. Patient is ambulatory in the emergency department. Patient denies any recent travel, flights, or recent surgeries. Patient denies any shortness of breath, chest pain, fever, chills, numbness, or weakness.   The history is provided by the patient. No language interpreter was used.    Past Medical History:  Diagnosis Date  . Arthritis    "left knee" (01/01/2013)  . Diabetes mellitus without complication (HCC)    borderline no med  . DVT of lower extremity (deep venous thrombosis) (HCC) 2007   "LLE; from birth control pill" (01/01/2013)  . GERD (gastroesophageal reflux disease)   . Headache    pt. states migraines at times  . Hypercholesteremia    Crestor  . Hypertension     Patient Active Problem List   Diagnosis Date Noted  . Osteoarthritis of right hip 07/22/2015  . 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 Acute blood loss anemia 01/04/2013  . Osteoarthritis of left knee  01/01/2013    Class: Chronic    Past Surgical History:  Procedure Laterality Date  . ANTERIOR CERVICAL DECOMP/DISCECTOMY FUSION  2006  . CARPAL TUNNEL RELEASE  2000   "right" (01/01/2013)  . CESAREAN SECTION  1978  . COLONOSCOPY    . ESOPHAGOGASTRODUODENOSCOPY    . KIDNEY DONATION  1994  . KNEE ARTHROPLASTY  01/01/2013   Procedure: COMPUTER ASSISTED TOTAL KNEE ARTHROPLASTY;  Surgeon: Kerrin ChampagneJames E Nitka, MD;  Location: [facility] OR;  Service: Orthopedics;  Laterality: Left;  Left computer assisted total knee replacement  . LIPOMA EXCISION  2000   back  . REPLACEMENT TOTAL KNEE  01/01/2013   "left" (01/01/2013)  . TOTAL HIP ARTHROPLASTY Right 07/22/2015   Procedure: RIGHT TOTAL HIP ARTHROPLASTY ANTERIOR APPROACH;  Surgeon: Kathryne Hitchhristopher Y Blackman, MD;  [facility];  Service: Orthopedics;  Laterality: Right;    OB History    No data available       Home Medications    Prior to Admission medications   Medication Sig Start Date End Date Taking? Authorizing Provider  aspirin EC 325 MG EC tablet Take 1 tablet (325 mg total) by mouth 2 (two) times daily after a meal. Patient not taking: Reported on 11/08/2016 07/25/15   Kathryne HitchBlackman, Christopher Y, MD  aspirin EC 81 MG tablet Take 81 mg by mouth daily.    [provider]  CALCIUM PO Take 1 tablet by mouth at bedtime.     [provider]  cholecalciferol (VITAMIN D) 1000 UNITS tablet Take 1,000 Units by mouth  at bedtime.    [provider]  CRESTOR 10 MG tablet Take 10 mg by mouth at bedtime. 07/04/15   [provider]  diltiazem (CARDIZEM CD) 360 MG 24 hr capsule Take 360 mg by mouth at bedtime.     [provider]  gabapentin (NEURONTIN) 300 MG capsule TAKE 1 CAPSULE(300 MG) BY MOUTH AT BEDTIME 01/03/17   Kathryne Hitch, MD  methylPREDNISolone (MEDROL) 4 MG tablet Medrol dose pack. Take as instructed 11/08/16   Kathryne Hitch, MD  oxyCODONE-acetaminophen (ROXICET) 5-325 MG per tablet Take 1-2  tablets by mouth every 4 (four) hours as needed. Patient not taking: Reported on 11/08/2016 07/25/15   Kathryne Hitch, MD  RABEprazole (ACIPHEX) 20 MG tablet Take 20 mg by mouth at bedtime.     [provider]  tiZANidine (ZANAFLEX) 4 MG tablet Take 1 tablet (4 mg total) by mouth every 6 (six) hours as needed for muscle spasms. Patient not taking: Reported on 11/08/2016 07/25/15   Kathryne Hitch, MD  traMADol (ULTRAM) 50 MG tablet Take 1-2 tablets (50-100 mg total) by mouth every 12 (twelve) hours as needed for moderate pain or severe pain. 03/02/17   Kirtland Bouchard, PA-C    Family History No family history on file.  Social History Social History  Substance Use Topics  . Smoking status: Never Smoker  . Smokeless tobacco: Never Used  . Alcohol use No     Allergies   Penicillins   Review of Systems Review of Systems  Constitutional: Negative for chills and fever.  Musculoskeletal: Positive for arthralgias (left inner leg) and joint swelling (left ankle). Negative for gait problem.  Neurological: Negative for weakness and numbness.  All other systems reviewed and are negative.    Physical Exam Updated Vital Signs BP (!) 154/79   Pulse 71   Temp 98.2 F (36.8 C) (Oral)   Resp 20   Ht 5\' 3"  (1.6 m)   Wt 90 kg (198 lb 6.6 oz)   SpO2 100%   BMI 35.15 kg/m   Physical Exam  Constitutional: She is oriented to person, place, and time. She appears well-developed and well-nourished.  HENT:  Head: Normocephalic and atraumatic.  Right Ear: External ear normal.  Left Ear: External ear normal.  Nose: Nose normal.  Eyes: Right eye exhibits no discharge. Left eye exhibits no discharge.  Cardiovascular: Normal rate.   Pulses:      Dorsalis pedis pulses are Detected w/ doppler on the right side, and Detected w/ doppler on the left side.  Pulmonary/Chest: Effort normal.  Musculoskeletal: Normal range of motion. She exhibits edema and tenderness. She  exhibits no deformity.  Mild left ankle and foot swelling.  Mild medial thigh tenderness, but no focal swelling or point tenderness.  Bilateral lower extremities are well profused and have normal sensation.  No decreased ROM of left hip or knee. No joint swelling or skin color changes  Neurological: She is alert and oriented to person, place, and time.  Skin: Skin is warm and dry. No erythema.  Nursing note and vitals reviewed.    ED Treatments / Results  DIAGNOSTIC STUDIES: Oxygen Saturation is 99% on RA, normal by my interpretation.    COORDINATION OF CARE: 11:14 PM Discussed treatment plan with pt at bedside and pt agreed to plan, which includes pain medication and blood thinners.   Labs (all labs ordered are listed, but only abnormal results are displayed) Labs Reviewed - No data to display  EKG  EKG Interpretation None       Radiology No results found.  Procedures Procedures (including critical care time)  Medications Ordered in ED Medications  HYDROcodone-acetaminophen (NORCO/VICODIN) 5-325 MG per tablet 2 tablet (2 tablets Oral Given 06/19/17 2336)  enoxaparin (LOVENOX) injection 1 mg/kg (90 mg Subcutaneous Given 06/19/17 2337)  enoxaparin (LOVENOX) 100 MG/ML injection (90 mg  Given 06/19/17 2337)     Initial Impression / Assessment and Plan / ED Course  I have reviewed the triage vital signs and the nursing notes.  Pertinent labs & imaging results that were available during my care of the patient were reviewed by me and considered in my medical decision making (see chart for details).     Currently, patient's leg appears mildly swollen at the feet and ankles but there is no diffuse swelling or color change to suggest severe DVT. She has no chest symptoms such as chest pain or shortness of breath. Given her prior history of DVT think she deathly needs an ultrasound but could be that this is a muscular etiology. There is no skin change or focal swelling. No  obvious infection. She will given a dose of subcutaneous Lovenox and referred to come back in the morning for her ultrasound as this is not available at this time of night. Discussed strict return precautions if symptoms were to worsen or not improve  Final Clinical Impressions(s) / ED Diagnoses   Final diagnoses:  Left thigh pain    New Prescriptions Discharge Medication List as of 06/19/2017 11:33 PM     I personally performed the services described in this documentation, which was scribed in my presence. The recorded information has been reviewed and is accurate.     Pricilla Loveless, MD 06/20/17 [phone]

## 2017-06-20 ENCOUNTER — Ambulatory Visit (HOSPITAL_BASED_OUTPATIENT_CLINIC_OR_DEPARTMENT_OTHER)
Admission: RE | Admit: 2017-06-20 | Discharge: 2017-06-20 | Disposition: A | Discharge status: Home / Self Care | Payer: 59 | Source: Ambulatory Visit | Attending: Emergency Medicine | Admitting: Emergency Medicine

## 2017-06-20 ENCOUNTER — Ambulatory Visit (INDEPENDENT_AMBULATORY_CARE_PROVIDER_SITE_OTHER): Payer: 59 | Admitting: Orthopaedic Surgery

## 2017-06-20 DIAGNOSIS — R52 Pain, unspecified: Secondary | ICD-10-CM

## 2017-06-20 DIAGNOSIS — M79652 Pain in left thigh: Secondary | ICD-10-CM | POA: Diagnosis not present

## 2017-06-24 ENCOUNTER — Ambulatory Visit (INDEPENDENT_AMBULATORY_CARE_PROVIDER_SITE_OTHER): Payer: 59

## 2017-06-24 ENCOUNTER — Encounter (INDEPENDENT_AMBULATORY_CARE_PROVIDER_SITE_OTHER): Payer: Self-pay | Admitting: Specialist

## 2017-06-24 ENCOUNTER — Ambulatory Visit (INDEPENDENT_AMBULATORY_CARE_PROVIDER_SITE_OTHER): Payer: 59 | Admitting: Specialist

## 2017-06-24 VITALS — BP 141/83 | HR 56 | Ht 63.0 in | Wt 190.0 lb

## 2017-06-24 DIAGNOSIS — M1711 Unilateral primary osteoarthritis, right knee: Secondary | ICD-10-CM

## 2017-06-24 DIAGNOSIS — M1612 Unilateral primary osteoarthritis, left hip: Secondary | ICD-10-CM | POA: Diagnosis not present

## 2017-06-24 DIAGNOSIS — Z96652 Presence of left artificial knee joint: Secondary | ICD-10-CM | POA: Diagnosis not present

## 2017-06-24 DIAGNOSIS — M25562 Pain in left knee: Secondary | ICD-10-CM

## 2017-06-24 MED ORDER — TRAMADOL HCL 50 MG PO TABS: 50.0000 mg | ORAL_TABLET | Freq: Two times a day (BID) | ORAL | 0 refills | Status: DC | PRN

## 2017-06-24 NOTE — Patient Instructions (Signed)
  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 Well padded shoes help. Ice the knee 2-3 times a day 15-20 mins at a time.

## 2017-06-24 NOTE — Progress Notes (Signed)
Office Visit Note   Patient: Diana Bowers           Date of Birth: 02-17-1951           MRN: [id number] Visit Date: 06/24/2017              Requested by: Caffie DammeSmith, Karla, MD [address] [place], [place] [id number] PCP: Caffie DammeSmith, Karla, MD   Assessment & Plan: Visit Diagnoses:  1. Left knee pain, unspecified chronicity   2. Unilateral primary osteoarthritis, left hip   3. Hx of total knee replacement, left   4. 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 Well padded shoes help. Ice the knee 2-3 times a day 15-20 mins at a time.  Follow-Up Instructions: Return in about 6 months (around 12/24/2017).   Orders:  Orders Placed This Encounter  Procedures  . XR Knee 1-2 Views Left   No orders of the defined types were placed in this encounter.     Procedures: No procedures performed   Clinical Data: No additional findings.   Subjective: Chief Complaint  Patient presents with  . Left Knee - Pain    Hx of knee replacement     66 year old female status post left total knee replacement for severe varus OA now with persistent left thigh pain and knee discomfort. Worse with standing and walking. She has pain with initiating walking then the pain improves with some walking. She has only one kidney and is advised to not use NSAIDS.  She has an appointment to see Dr. Magnus IvanBlackman for consideration of a eft THR as radiographs demonstrate left hip OA and injection temporarily relieved the left thigh and knee pain which is suggestive of a primary hip source of her pain.    Review of Systems  Constitutional: Negative.   HENT: Negative.   Eyes: Negative.   Respiratory: Negative.   Cardiovascular: Negative.   Gastrointestinal: Negative.   Endocrine: Negative.   Genitourinary: Negative.   Musculoskeletal: Negative.   Skin: Negative.     Allergic/Immunologic: Negative.   Neurological: Negative.   Hematological: Negative.   Psychiatric/Behavioral: Negative.      Objective: Vital Signs: BP (!) 141/83 (BP Location: Left Arm, Patient Position: Sitting)   Pulse (!) 56   Ht 5\' 3"  (1.6 m)   Wt 190 lb (86.2 kg)   BMI 33.66 kg/m   Physical Exam  Constitutional: She is oriented to person, place, and time. She appears well-developed and well-nourished.  HENT:  Head: Normocephalic and atraumatic.  Eyes: EOM are normal. Pupils are equal, round, and reactive to light.  Neck: Normal range of motion. Neck supple.  Pulmonary/Chest: Effort normal and breath sounds normal.  Abdominal: Soft. Bowel sounds are normal.  Musculoskeletal: Normal range of motion.  Neurological: She is alert and oriented to person, place, and time.  Skin: Skin is warm and dry.  Psychiatric: She has a normal mood and affect. Her behavior is normal. Judgment and thought content normal.    Back Exam   Tenderness  The patient is experiencing tenderness in the lumbar.  Range of Motion  Extension: normal  Flexion: normal  Lateral Bend Right: normal  Lateral Bend Left: normal  Rotation Right: normal  Rotation Left: normal   Muscle Strength  Right Quadriceps:  5/5  Left Quadriceps:  5/5  Right Hamstrings:  5/5  Left  Hamstrings:  5/5   Tests  Straight leg raise right: negative Straight leg raise left: negative  Reflexes  Patellar: normal Achilles: normal Biceps: normal Babinski's sign: normal   Other  Toe Walk: normal Heel Walk: normal Sensation: normal Gait: normal  Erythema: no back redness Scars: absent      Specialty Comments:  No specialty comments available.  Imaging: Xr Knee 1-2 Views Left  Result Date: 06/24/2017 AP and lateral of the left knee and standing AP of the right knee with good position and alignment of the left TKR. AP of the right knee with narrowing of the medial joint line of the right knee consistent  with varus OA.     PMFS History: Patient Active Problem List   Diagnosis Date Noted  . Osteoarthritis of left knee 01/01/2013    Priority: High    Class: Chronic  . Osteoarthritis of right hip 07/22/2015  . 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 Acute blood loss anemia 01/04/2013   Past Medical History:  Diagnosis Date  . Arthritis    "left knee" (01/01/2013)  . Diabetes mellitus without complication (HCC)    borderline no med  . DVT of lower extremity (deep venous thrombosis) (HCC) 2007   "LLE; from birth control pill" (01/01/2013)  . GERD (gastroesophageal reflux disease)   . Headache    pt. states migraines at times  . Hypercholesteremia    Crestor  . Hypertension     No family history on file.  Past Surgical History:  Procedure Laterality Date  . ANTERIOR CERVICAL DECOMP/DISCECTOMY FUSION  2006  . CARPAL TUNNEL RELEASE  2000   "right" (01/01/2013)  . CESAREAN SECTION  1978  . COLONOSCOPY    . ESOPHAGOGASTRODUODENOSCOPY    . KIDNEY DONATION  1994  . KNEE ARTHROPLASTY  01/01/2013   Procedure: COMPUTER ASSISTED TOTAL KNEE ARTHROPLASTY;  Surgeon: Kerrin Champagne, MD;  Location: [facility] OR;  Service: Orthopedics;  Laterality: Left;  Left computer assisted total knee replacement  . LIPOMA EXCISION  2000   back  . REPLACEMENT TOTAL KNEE  01/01/2013   "left" (01/01/2013)  . TOTAL HIP ARTHROPLASTY Right 07/22/2015   Procedure: RIGHT TOTAL HIP ARTHROPLASTY ANTERIOR APPROACH;  Surgeon: Kathryne Hitch, MD;  Location: [facility] OR;  Service: Orthopedics;  Laterality: Right;   Social History   Occupational History  . Not on file.   Social History Main Topics  . Smoking status: Never Smoker  . Smokeless tobacco: Never Used  . Alcohol use No  . Drug use: No  . Sexual activity: Yes

## 2017-06-27 ENCOUNTER — Ambulatory Visit (INDEPENDENT_AMBULATORY_CARE_PROVIDER_SITE_OTHER): Payer: 59 | Admitting: Orthopaedic Surgery

## 2017-06-27 ENCOUNTER — Ambulatory Visit (INDEPENDENT_AMBULATORY_CARE_PROVIDER_SITE_OTHER): Payer: 59

## 2017-06-27 DIAGNOSIS — M25552 Pain in left hip: Secondary | ICD-10-CM

## 2017-06-27 NOTE — Progress Notes (Signed)
The patient is well-known to me. She is 23 months out from a right total hip arthroplasty direct injury approach. Her left hip and thigh been hurting her recently. Back in March Dr. Alvester MorinNewton here in the office provided a intra-articular steroid injection her left hip. She says really is been her thigh going down her legs been hurting her and she has no pain in her groin at the moment.  On examination I can put her left hip through internal/external rotation with no problems at all. Her flexion-extension is normal of that hip and her internal rotation rotation is normal. She can cross her leg easily her as well.  X-rays the pelvis and hip show some mild arthritic changes in the left hip with potentially some cystic changes in the femoral head but the joint space is well-maintained.  At this point I'm definitely recommend she needs any type of hip replacement surgery and she does not feel she needs to either. She was given back her normal walking routine. She has started Neurontin recently. Also Dr. Otelia SergeantNitka recently send in some tramadol. From my standpoint for hips she'll follow-up as needed. All questions were encouraged and answered. If it flares up for again we would certainly see her back.

## 2017-08-10 ENCOUNTER — Other Ambulatory Visit (INDEPENDENT_AMBULATORY_CARE_PROVIDER_SITE_OTHER): Payer: Self-pay

## 2017-08-10 ENCOUNTER — Ambulatory Visit (INDEPENDENT_AMBULATORY_CARE_PROVIDER_SITE_OTHER): Payer: 59 | Admitting: Orthopaedic Surgery

## 2017-08-10 DIAGNOSIS — M7062 Trochanteric bursitis, left hip: Secondary | ICD-10-CM | POA: Insufficient documentation

## 2017-08-10 DIAGNOSIS — M25552 Pain in left hip: Secondary | ICD-10-CM

## 2017-08-10 MED ORDER — LIDOCAINE HCL 1 % IJ SOLN
3.0000 mL | INTRAMUSCULAR | Status: AC | PRN
  Administered 2017-08-10: 3 mL

## 2017-08-10 MED ORDER — METHYLPREDNISOLONE ACETATE 40 MG/ML IJ SUSP
40.0000 mg | INTRAMUSCULAR | Status: AC | PRN
  Administered 2017-08-10: 40 mg via INTRA_ARTICULAR

## 2017-08-10 NOTE — Progress Notes (Signed)
Office Visit Note   Patient: Diana Bowers           Date of Birth: 19-Jun-1951           MRN: [id number] Visit Date: 08/10/2017              Requested by: Caffie Damme, MD [address] [place], [place] [address] PCP: Caffie Damme, MD   Assessment & Plan: Visit Diagnoses:  1. Trochanteric bursitis, left hip     Plan: Given the pain of her left trochanteric area offered her injection and she is agreeable to this. She tolerated the injection well. I explained the risks and benefits of the seventh of injections with her as well. I'll see her back in 4 weeks to see how she doing overall. I would also like to send her to [facility] outpatient rehabilitation at [place] to see if they can work on modalities to decrease her hip and thigh pain on the left side as well as strength in her lower extremities and potentially assess the back as well. They may even want to consider distributing a TENS unit if warranted.  Follow-Up Instructions: Return in about 4 weeks (around 09/07/2017).   Orders:  No orders of the defined types were placed in this encounter.  No orders of the defined types were placed in this encounter.     Procedures: Large Joint Inj Date/Time: 08/10/2017 9:37 AM Performed by: Kathryne Hitch Authorized by: Kathryne Hitch   Location:  Hip Site:  L greater trochanter Ultrasound Guidance: No   Fluoroscopic Guidance: No   Arthrogram: No   Medications:  3 mL lidocaine 1 %; 40 mg methylPREDNISolone acetate 40 MG/ML     Clinical Data: No additional findings.   Subjective: No chief complaint on file. The patient comes in today still complaining of left-sided thigh pain. She says she is been sleeping in a recliner for the last month. She says it hurts mainly in her thigh and goes down her leg. She denies any groin pain as well. It's definitely been slowly getting worse and she's wondering if any type of injection may help her. We have already x-rayed  her hip and pelvis for and showed only mild arthritic changes in her hip.  HPI  Review of Systems She currently denies any change in bowel or bladder function. She denies any chest pain, she headache, shortness of breath, fever, chills, nausea, vomiting.  Objective: Vital Signs: There were no vitals taken for this visit.  Physical Exam She is alert and 3 and in no acute distress Ortho Exam Emanation of her left hip shows fluid motion with flexion extension and internal and external rotation. There is only some pain over the trochanteric area but not the IT band. She has excellent strength in her knee with good range of motion in the as well as the foot and ankle. Specialty Comments:  No specialty comments available.  Imaging: No results found.   PMFS History: Patient Active Problem List   Diagnosis Date Noted  . Trochanteric bursitis, left hip 08/10/2017  . Osteoarthritis of right hip 07/22/2015  . 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 Acute blood loss anemia 01/04/2013  . Osteoarthritis of left knee 01/01/2013    Class: Chronic   Past Medical History:  Diagnosis Date  . Arthritis    "left knee" (01/01/2013)  . Diabetes mellitus without complication (HCC)    borderline no med  . DVT of  lower extremity (deep venous thrombosis) (HCC) 2007   "LLE; from birth control pill" (01/01/2013)  . GERD (gastroesophageal reflux disease)   . Headache    pt. states migraines at times  . Hypercholesteremia    Crestor  . Hypertension     No family history on file.  Past Surgical History:  Procedure Laterality Date  . ANTERIOR CERVICAL DECOMP/DISCECTOMY FUSION  2006  . CARPAL TUNNEL RELEASE  2000   "right" (01/01/2013)  . CESAREAN SECTION  1978  . COLONOSCOPY    . ESOPHAGOGASTRODUODENOSCOPY    . KIDNEY DONATION  1994  . KNEE ARTHROPLASTY  01/01/2013   Procedure: COMPUTER ASSISTED TOTAL KNEE ARTHROPLASTY;  Surgeon: Kerrin ChampagneJames E Nitka, MD;  Location: [facility] OR;  Service:  Orthopedics;  Laterality: Left;  Left computer assisted total knee replacement  . LIPOMA EXCISION  2000   back  . REPLACEMENT TOTAL KNEE  01/01/2013   "left" (01/01/2013)  . TOTAL HIP ARTHROPLASTY Right 07/22/2015   Procedure: RIGHT TOTAL HIP ARTHROPLASTY ANTERIOR APPROACH;  Surgeon: Kathryne Hitchhristopher Y Lucienne Sawyers, MD;  [facility];  Service: Orthopedics;  Laterality: Right;   Social History   Occupational History  . Not on file.   Social History Main Topics  . Smoking status: Never Smoker  . Smokeless tobacco: Never Used  . Alcohol use No  . Drug use: No  . Sexual activity: Yes

## 2017-08-17 ENCOUNTER — Encounter: Payer: Self-pay | Admitting: Physical Therapy

## 2017-08-17 ENCOUNTER — Ambulatory Visit: Payer: 59 | Attending: Orthopaedic Surgery | Admitting: Physical Therapy

## 2017-08-17 DIAGNOSIS — M25652 Stiffness of left hip, not elsewhere classified: Secondary | ICD-10-CM | POA: Diagnosis present

## 2017-08-17 DIAGNOSIS — M25552 Pain in left hip: Secondary | ICD-10-CM | POA: Diagnosis present

## 2017-08-17 DIAGNOSIS — R262 Difficulty in walking, not elsewhere classified: Secondary | ICD-10-CM | POA: Diagnosis present

## 2017-08-17 NOTE — Therapy (Signed)
[facility] [address] [place], [place], [address] Phone: [phone]   Fax:  [phone]  Physical Therapy Evaluation  Patient Details  Name: Diana Bowers MRN: [id number] Date of Birth: [birth year] Referring Provider: Magnus Ivan  Encounter Date: 08/17/2017      PT End of Session - 08/17/17 0820    Visit Number 1   Date for PT Re-Evaluation 10/17/17   PT Start Time 0757   PT Stop Time 0850   PT Time Calculation (min) 53 min   Activity Tolerance Patient tolerated treatment well   Behavior During Therapy [facility] for tasks assessed/performed      Past Medical History:  Diagnosis Date  . Arthritis    "left knee" (01/01/2013)  . Diabetes mellitus without complication (HCC)    borderline no med  . DVT of lower extremity (deep venous thrombosis) (HCC) 2007   "LLE; from birth control pill" (01/01/2013)  . GERD (gastroesophageal reflux disease)   . Headache    pt. states migraines at times  . Hypercholesteremia    Crestor  . Hypertension     Past Surgical History:  Procedure Laterality Date  . ANTERIOR CERVICAL DECOMP/DISCECTOMY FUSION  2006  . CARPAL TUNNEL RELEASE  2000   "right" (01/01/2013)  . CESAREAN SECTION  1978  . COLONOSCOPY    . ESOPHAGOGASTRODUODENOSCOPY    . KIDNEY DONATION  1994  . KNEE ARTHROPLASTY  01/01/2013   Procedure: COMPUTER ASSISTED TOTAL KNEE ARTHROPLASTY;  Surgeon: Kerrin Champagne, MD;  Location: [facility] OR;  Service: Orthopedics;  Laterality: Left;  Left computer assisted total knee replacement  . LIPOMA EXCISION  2000   back  . REPLACEMENT TOTAL KNEE  01/01/2013   "left" (01/01/2013)  . TOTAL HIP ARTHROPLASTY Right 07/22/2015   Procedure: RIGHT TOTAL HIP ARTHROPLASTY ANTERIOR APPROACH;  Surgeon: Kathryne Hitch, MD;  Location: [facility] OR;  Service: Orthopedics;  Laterality: Right;    There were no vitals filed for this visit.       Subjective Assessment - 08/17/17 0756    Subjective Patient  reports that she started having left hip pain after a long bus ride.  She reports that the pain was so bad she went to the ED.  She has had x-rays of the knee and the hip, the hip shows some arthritic changes.  She had a cortisone injection last week that has helped relieve the pain.     Pertinent History left TKR, right THR   Limitations Walking;Standing;House hold activities   Patient Stated Goals have less pain, walk easier   Currently in Pain? Yes   Pain Score 1    Pain Location Hip   Pain Orientation Left;Lateral   Pain Descriptors / Indicators Aching   Pain Type Acute pain   Pain Onset More than a month ago   Pain Frequency Intermittent   Aggravating Factors  pain up to 7/10 with walking, lying down in bed   Pain Relieving Factors recliner, pain meds, easy motions pain pain can be 0/10   Effect of Pain on Daily Activities difficult to sleep, feels like it will give out            [facility] PT Assessment - 08/17/17 0001      Assessment   Medical Diagnosis left hip bursitis   Referring Provider Magnus Ivan   Onset Date/Surgical Date 07/17/17   Prior Therapy none     Precautions   Precautions None     Balance  Screen   Has the patient fallen in the past 6 months No   Has the patient had a decrease in activity level because of a fear of falling?  No   Is the patient reluctant to leave their home because of a fear of falling?  No     Home Environment   Additional Comments does housework, no stairs     Prior Function   Level of Independence Independent   Vocation Retired   Leisure walk 2 miles a day, has had to cut back due to hip pain     ROM / Strength   AROM / PROM / Strength AROM;Strength     AROM   Overall AROM Comments Lumbar ROM decreased 25%, left hip abduction 10 degrees, flexion 60 degrees, ER 10 degrees, IR 0 degrees     Strength   Overall Strength Comments left hip 3+/5 with pain     Flexibility   Soft Tissue Assessment /Muscle Length yes   Hamstrings very  tight   Quadriceps tight and painful   ITB tight and painful   Piriformis tight and painful, adductors also tight and painful     Palpation   Palpation comment tightness in the ITB, buttock and adductors but not very tender     Ambulation/Gait   Gait Comments antalgic on the left, especially the first few steps            Objective measurements completed on examination: See above findings.          [facility] Adult PT Treatment/Exercise - 08/17/17 0001      Modalities   Modalities Electrical Stimulation;Moist Heat     Moist Heat Therapy   Number Minutes Moist Heat 15 Minutes   Moist Heat Location Hip     Electrical Stimulation   Electrical Stimulation Location left hip   Electrical Stimulation Action IFC   Electrical Stimulation Parameters sitting   Electrical Stimulation Goals Pain                PT Education - 08/17/17 0820    Education provided Yes   Education Details hip flexibility HEP   Person(s) Educated Patient   Methods Explanation;Demonstration;Handout   Comprehension Verbalized understanding          PT Short Term Goals - 08/17/17 0828      PT SHORT TERM GOAL #1   Title independent with initial HEP   Time 2   Period Weeks   Status New           PT Long Term Goals - 08/17/17 0981      PT LONG TERM GOAL #1   Title increase AROM of flexion of the left hip to 90 degrees   Time 8   Period Weeks   Status New     PT LONG TERM GOAL #2   Title decrease pain 50%   Time 8   Period Weeks   Status New     PT LONG TERM GOAL #3   Title sleep in bed 4 hours at a time   Time 8   Period Weeks   Status New     PT LONG TERM GOAL #4   Title walk without a limp   Time 8   Period Weeks   Status New     PT LONG TERM GOAL #5   Title increase left hip strength to 4/5   Time 8   Period Weeks   Status New  Plan - 08/17/17 0712    Clinical Impression Statement Patient had left hip pain start after a bus ride,  she is very weak and very tight on the left hip.  She cannot stand on the left leg >2 seconds.  Very limited hip ROM, limps significantly on the left for the first few steps and then smooths out.  She reports her biggest issue is being unable to lie down, this could be due to the hip flexor tightness.  She was not tender to palpation   History and Personal Factors relevant to plan of care: left TKR, right THR   Clinical Presentation Evolving   Clinical Presentation due to: had recent injection, has been to ED   Clinical Decision Making Low   Rehab Potential Good   PT Frequency 2x / week   PT Duration 8 weeks   PT Treatment/Interventions ADLs/Self Care Home Management;Cryotherapy;Electrical Stimulation;Iontophoresis 4mg /ml Dexamethasone;Functional mobility training;Gait training;Moist Heat;Ultrasound;Therapeutic activities;Therapeutic exercise;Neuromuscular re-education;Cognitive remediation;Patient/family education;Manual techniques   PT Next Visit Plan slowly start exercises, work on flexibility and strength   Consulted and Agree with Plan of Care Patient      Patient will benefit from skilled therapeutic intervention in order to improve the following deficits and impairments:  Abnormal gait, Decreased activity tolerance, Decreased balance, Decreased mobility, Decreased strength, Improper body mechanics, Impaired flexibility, Pain, Increased muscle spasms, Difficulty walking, Decreased range of motion  Visit Diagnosis: Pain in left hip - Plan: PT plan of care cert/re-cert  Difficulty in walking, not elsewhere classified - Plan: PT plan of care cert/re-cert  Stiffness of left hip, not elsewhere classified - Plan: PT plan of care cert/re-cert     Problem List Patient Active Problem List   Diagnosis Date Noted  . Trochanteric bursitis, left hip 08/10/2017  . Osteoarthritis of right hip 07/22/2015  . 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 Acute blood loss anemia  01/04/2013  . Osteoarthritis of left knee 01/01/2013    Class: Chronic    Jearld Lesch., PT 08/17/2017, 8:36 AM  [facility] [address] [place], [place], [address] Phone: [phone]   Fax:  [phone]  Name: Diana Bowers MRN: [id number] Date of Birth: [birth year]

## 2017-08-22 ENCOUNTER — Encounter: Payer: Self-pay | Admitting: Physical Therapy

## 2017-08-22 ENCOUNTER — Ambulatory Visit: Payer: 59 | Admitting: Physical Therapy

## 2017-08-22 DIAGNOSIS — R262 Difficulty in walking, not elsewhere classified: Secondary | ICD-10-CM

## 2017-08-22 DIAGNOSIS — M25552 Pain in left hip: Secondary | ICD-10-CM

## 2017-08-22 DIAGNOSIS — M25652 Stiffness of left hip, not elsewhere classified: Secondary | ICD-10-CM

## 2017-08-22 NOTE — Therapy (Signed)
[facility] [address] [place], [place], [address] Phone: [phone]   Fax:  [phone]  Physical Therapy Treatment  Patient Details  Name: Diana Bowers MRN: [id number] Date of Birth: 1951/12/09 Referring Provider: Magnus Ivan  Encounter Date: 08/22/2017      PT End of Session - 08/22/17 1015    Visit Number 2   Date for PT Re-Evaluation 10/17/17   PT Start Time 0930   PT Stop Time 1030   PT Time Calculation (min) 60 min   Activity Tolerance Patient tolerated treatment well   Behavior During Therapy [facility] for tasks assessed/performed      Past Medical History:  Diagnosis Date  . Arthritis    "left knee" (01/01/2013)  . Diabetes mellitus without complication (HCC)    borderline no med  . DVT of lower extremity (deep venous thrombosis) (HCC) 2007   "LLE; from birth control pill" (01/01/2013)  . GERD (gastroesophageal reflux disease)   . Headache    pt. states migraines at times  . Hypercholesteremia    Crestor  . Hypertension     Past Surgical History:  Procedure Laterality Date  . ANTERIOR CERVICAL DECOMP/DISCECTOMY FUSION  2006  . CARPAL TUNNEL RELEASE  2000   "right" (01/01/2013)  . CESAREAN SECTION  1978  . COLONOSCOPY    . ESOPHAGOGASTRODUODENOSCOPY    . KIDNEY DONATION  1994  . KNEE ARTHROPLASTY  01/01/2013   Procedure: COMPUTER ASSISTED TOTAL KNEE ARTHROPLASTY;  Surgeon: Kerrin Champagne, MD;  [facility];  Service: Orthopedics;  Laterality: Left;  Left computer assisted total knee replacement  . LIPOMA EXCISION  2000   back  . REPLACEMENT TOTAL KNEE  01/01/2013   "left" (01/01/2013)  . TOTAL HIP ARTHROPLASTY Right 07/22/2015   Procedure: RIGHT TOTAL HIP ARTHROPLASTY ANTERIOR APPROACH;  Surgeon: Kathryne Hitch, MD;  Location: [facility] OR;  Service: Orthopedics;  Laterality: Right;    There were no vitals filed for this visit.      Subjective Assessment - 08/22/17 0932    Subjective Pt reports that  she tries to walk 2 miles a day but once she hits the 1 mile mark she feels like her thigh is going to give out.   Currently in Pain? Yes   Pain Score 5    Pain Location Hip   Pain Orientation Left                         [facility] Adult PT Treatment/Exercise - 08/22/17 0001      Exercises   Exercises Knee/Hip     Knee/Hip Exercises: Aerobic   Nustep L4 x 7 min     Knee/Hip Exercises: Seated   Long Arc Quad 2 sets;10 reps;Both;Weights   Long Arc Quad Weight 3 lbs.   Ball Squeeze 2x10   Marching 2 sets;Both;10 reps;Weights   Marching Limitations 3   Hamstring Curl 15 reps;2 sets;Both   Hamstring Limitations red tband    Sit to Sand 2 sets;10 reps;without UE support;Other (comment)     Modalities   Modalities Electrical Stimulation;Moist Heat     Moist Heat Therapy   Number Minutes Moist Heat 15 Minutes   Moist Heat Location Hip     Electrical Stimulation   Electrical Stimulation Location left hip   Electrical Stimulation Action IFC   Electrical Stimulation Parameters sitting   Electrical Stimulation Goals Pain  PT Short Term Goals - 08/17/17 2130      PT SHORT TERM GOAL #1   Title independent with initial HEP   Time 2   Period Weeks   Status New           PT Long Term Goals - 08/17/17 8657      PT LONG TERM GOAL #1   Title increase AROM of flexion of the left hip to 90 degrees   Time 8   Period Weeks   Status New     PT LONG TERM GOAL #2   Title decrease pain 50%   Time 8   Period Weeks   Status New     PT LONG TERM GOAL #3   Title sleep in bed 4 hours at a time   Time 8   Period Weeks   Status New     PT LONG TERM GOAL #4   Title walk without a limp   Time 8   Period Weeks   Status New     PT LONG TERM GOAL #5   Title increase left hip strength to 4/5   Time 8   Period Weeks   Status New               Plan - 08/22/17 1016    Clinical Impression Statement Pt tolerated an initial  progression to exercises well. Reports no pain only weakness in her LLE. Pt with a limp on the L side that clears up as the takes more steps. Increase time needed for Pt to rest,     Rehab Potential Good   PT Frequency 2x / week   PT Duration 8 weeks   PT Treatment/Interventions ADLs/Self Care Home Management;Cryotherapy;Electrical Stimulation;Iontophoresis 4mg /ml Dexamethasone;Functional mobility training;Gait training;Moist Heat;Ultrasound;Therapeutic activities;Therapeutic exercise;Neuromuscular re-education;Cognitive remediation;Patient/family education;Manual techniques   PT Next Visit Plan slowly start exercises, work on flexibility and strength      Patient will benefit from skilled therapeutic intervention in order to improve the following deficits and impairments:  Abnormal gait, Decreased activity tolerance, Decreased balance, Decreased mobility, Decreased strength, Improper body mechanics, Impaired flexibility, Pain, Increased muscle spasms, Difficulty walking, Decreased range of motion  Visit Diagnosis: Pain in left hip  Difficulty in walking, not elsewhere classified  Stiffness of left hip, not elsewhere classified     Problem List Patient Active Problem List   Diagnosis Date Noted  . Trochanteric bursitis, left hip 08/10/2017  . Osteoarthritis of right hip 07/22/2015  . 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 Acute blood loss anemia 01/04/2013  . Osteoarthritis of left knee 01/01/2013    Class: Chronic    Grayce Sessions, PTA 08/22/2017, 10:18 AM  [facility] [address] [place], [place], [address] Phone: [phone]   Fax:  [phone]  Name: Diana Bowers MRN: [id number] Date of Birth: 01-06-1951

## 2017-08-24 ENCOUNTER — Ambulatory Visit: Payer: 59 | Admitting: Physical Therapy

## 2017-08-26 ENCOUNTER — Ambulatory Visit: Payer: 59 | Admitting: Physical Therapy

## 2017-08-26 DIAGNOSIS — M25552 Pain in left hip: Secondary | ICD-10-CM | POA: Diagnosis not present

## 2017-08-26 DIAGNOSIS — R262 Difficulty in walking, not elsewhere classified: Secondary | ICD-10-CM

## 2017-08-26 DIAGNOSIS — M25652 Stiffness of left hip, not elsewhere classified: Secondary | ICD-10-CM

## 2017-08-26 NOTE — Therapy (Signed)
[facility] [address] [place], [place], [address] Phone: [phone]   Fax:  [phone]  Physical Therapy Treatment  Patient Details  Name: Diana Bowers MRN: [id number] Date of Birth: 1951-06-02 Referring Provider: Magnus Ivan  Encounter Date: 08/26/2017      PT End of Session - 08/26/17 0932    Visit Number 3   Date for PT Re-Evaluation 10/17/17   PT Start Time 0850   PT Stop Time 0945   PT Time Calculation (min) 55 min   Activity Tolerance Patient tolerated treatment well   Behavior During Therapy [facility] for tasks assessed/performed      Past Medical History:  Diagnosis Date   Arthritis    "left knee" (01/01/2013)   Diabetes mellitus without complication (HCC)    borderline no med   DVT of lower extremity (deep venous thrombosis) (HCC) 2007   "LLE; from birth control pill" (01/01/2013)   GERD (gastroesophageal reflux disease)    Headache    pt. states migraines at times   Hypercholesteremia    Crestor   Hypertension     Past Surgical History:  Procedure Laterality Date   ANTERIOR CERVICAL DECOMP/DISCECTOMY FUSION  2006   CARPAL TUNNEL RELEASE  2000   "right" (01/01/2013)   CESAREAN SECTION  1978   COLONOSCOPY     ESOPHAGOGASTRODUODENOSCOPY     KIDNEY DONATION  1994   KNEE ARTHROPLASTY  01/01/2013   Procedure: COMPUTER ASSISTED TOTAL KNEE ARTHROPLASTY;  Surgeon: Kerrin Champagne, MD;  [facility];  Service: Orthopedics;  Laterality: Left;  Left computer assisted total knee replacement   LIPOMA EXCISION  2000   back   REPLACEMENT TOTAL KNEE  01/01/2013   "left" (01/01/2013)   TOTAL HIP ARTHROPLASTY Right 07/22/2015   Procedure: RIGHT TOTAL HIP ARTHROPLASTY ANTERIOR APPROACH;  Surgeon: Kathryne Hitch, MD;  [facility];  Service: Orthopedics;  Laterality: Right;    There were no vitals filed for this visit.      Subjective Assessment - 08/26/17 0853    Subjective Cont with  decreased walking tolerance, limited to ~1.70miles before Left thigh weakness causes her to need to stop.  Nocturnal thigh and some lateral hip pain.     Currently in Pain? No/denies                         [facility] Adult PT Treatment/Exercise - 08/26/17 0001      Knee/Hip Exercises: Stretches   Passive Hamstring Stretch Left;3 reps;30 seconds   Quad Stretch Left;3 reps;30 seconds   Piriformis Stretch Left;3 reps;30 seconds   Other Knee/Hip Stretches TFL stretch 30"1x3     Knee/Hip Exercises: Aerobic   Nustep L5x7'     Knee/Hip Exercises: Machines for Strengthening   Cybex Knee Extension 20# 2x10   Cybex Knee Flexion 25#2x10     Knee/Hip Exercises: Standing   Forward Step Up Left;2 sets;10 reps;Step Height: 6"   Functional Squat 2 sets;10 reps     Knee/Hip Exercises: Seated   Ball Squeeze 5" 2x10     Knee/Hip Exercises: Supine   Bridges Limitations 5" 1x10   Other Supine Knee/Hip Exercises unilateral clamshell green 2x10ea b     Modalities   Modalities Electrical Stimulation;Moist Heat     Moist Heat Therapy   Number Minutes Moist Heat 15 Minutes   Moist Heat Location Hip     Electrical Stimulation   Electrical Stimulation Location left hip  Electrical Stimulation Action IFC   Electrical Stimulation Parameters Right sidelying   Electrical Stimulation Goals Pain                  PT Short Term Goals - 08/17/17 [id number]      PT SHORT TERM GOAL #1   Title independent with initial HEP   Time 2   Period Weeks   Status New           PT Long Term Goals - 08/17/17 [id number]      PT LONG TERM GOAL #1   Title increase AROM of flexion of the left hip to 90 degrees   Time 8   Period Weeks   Status New     PT LONG TERM GOAL #2   Title decrease pain 50%   Time 8   Period Weeks   Status New     PT LONG TERM GOAL #3   Title sleep in bed 4 hours at a time   Time 8   Period Weeks   Status New     PT LONG TERM GOAL #4   Title walk without a  limp   Time 8   Period Weeks   Status New     PT LONG TERM GOAL #5   Title increase left hip strength to 4/5   Time 8   Period Weeks   Status New               Plan - 08/26/17 0932    Clinical Impression Statement Good tolerance overall.  Challenged but sucessful with progression of strengthening.  Most limited with TFL flexibility.  Requires cues for upright trunk during gait.        Patient will benefit from skilled therapeutic intervention in order to improve the following deficits and impairments:     Visit Diagnosis: Pain in left hip  Difficulty in walking, not elsewhere classified  Stiffness of left hip, not elsewhere classified     Problem List Patient Active Problem List   Diagnosis Date Noted   Trochanteric bursitis, left hip 08/10/2017   Osteoarthritis of right hip 07/22/2015   Status post total replacement of right hip 07/22/2015   Acute blood loss anemia 01/04/2013   Osteoarthritis of left knee 01/01/2013    Class: Chronic    Tomie ChinaLarry C Clements, PTA 08/26/2017, 9:35 AM  [facility] [address] [place], [place], [address] Phone: [phone]   Fax:  [phone]  Name: Gweneth DimitriFaye R Beecham MRN: [id number] Date of Birth: 07/08/1951

## 2017-08-30 ENCOUNTER — Encounter: Payer: Self-pay | Admitting: Physical Therapy

## 2017-08-30 ENCOUNTER — Ambulatory Visit: Payer: 59 | Attending: Orthopaedic Surgery | Admitting: Physical Therapy

## 2017-08-30 DIAGNOSIS — R262 Difficulty in walking, not elsewhere classified: Secondary | ICD-10-CM | POA: Insufficient documentation

## 2017-08-30 DIAGNOSIS — M25552 Pain in left hip: Secondary | ICD-10-CM | POA: Diagnosis not present

## 2017-08-30 DIAGNOSIS — M25652 Stiffness of left hip, not elsewhere classified: Secondary | ICD-10-CM

## 2017-08-30 NOTE — Therapy (Signed)
[place], [place], [address] Phone: [phone]   Fax:  [phone]  Physical Therapy Treatment  Patient Details  Name: Diana Bowers MRN: [id number] Date of Birth: 11-22-1951 Referring Provider: Ninfa Linden  Encounter Date: 08/30/2017      PT End of Session - 08/30/17 1556    Visit Number 4   Date for PT Re-Evaluation 10/17/17   PT Start Time 3267   PT Stop Time 1610   PT Time Calculation (min) 55 min   Activity Tolerance Patient tolerated treatment well   Behavior During Therapy [facility] for tasks assessed/performed      Past Medical History:  Diagnosis Date  . Arthritis    "left knee" (01/01/2013)  . Diabetes mellitus without complication ([place])    borderline no med  . DVT of lower extremity (deep venous thrombosis) ([place]) 2007   "LLE; from birth control pill" (01/01/2013)  . GERD (gastroesophageal reflux disease)   . Headache    pt. states migraines at times  . Hypercholesteremia    Crestor  . Hypertension     Past Surgical History:  Procedure Laterality Date  . ANTERIOR CERVICAL DECOMP/DISCECTOMY FUSION  2006  . CARPAL TUNNEL RELEASE  2000   "right" (01/01/2013)  . [place]  . COLONOSCOPY    . ESOPHAGOGASTRODUODENOSCOPY    . KIDNEY DONATION  1994  . KNEE ARTHROPLASTY  01/01/2013   Procedure: COMPUTER ASSISTED TOTAL KNEE ARTHROPLASTY;  Surgeon: Jessy Oto, MD;  Location: [place];  Service: Orthopedics;  Laterality: Left;  Left computer assisted total knee replacement  . LIPOMA EXCISION  2000   back  . REPLACEMENT TOTAL KNEE  01/01/2013   "left" (01/01/2013)  . TOTAL HIP ARTHROPLASTY Right 07/22/2015   Procedure: RIGHT TOTAL HIP ARTHROPLASTY ANTERIOR APPROACH;  Surgeon: Mcarthur Rossetti, MD;  Location: [place];  Service: Orthopedics;  Laterality: Right;    There were no vitals filed for this visit.      Subjective Assessment - 08/30/17 1516    Subjective Pt reports that  she has some pain in the back of her leg today in water aerobics.    Currently in Pain? No/denies   Pain Score 0-No pain                         [facility] Adult PT Treatment/Exercise - 08/30/17 0001      Knee/Hip Exercises: Aerobic   Nustep L5x7'     Knee/Hip Exercises: Machines for Strengthening   Cybex Knee Extension 20# 2x10   Cybex Knee Flexion 25#2x10     Knee/Hip Exercises: Standing   Forward Step Up Left;2 sets;10 reps;Step Height: 6"   Other Standing Knee Exercises Standing hip abe/add 2lb 2x10    Other Standing Knee Exercises standing march 2lb 2x10      Knee/Hip Exercises: Seated   Ball Squeeze 5" 2x10   Sit to Sand 2 sets;10 reps;without UE support;Other (comment)     Modalities   Modalities Electrical Stimulation;Moist Heat     Moist Heat Therapy   Number Minutes Moist Heat 15 Minutes   Moist Heat Location Hip     Electrical Stimulation   Electrical Stimulation Location left hip   Electrical Stimulation Action IFC   Electrical Stimulation Parameters R sidelying   Electrical Stimulation Goals Pain                  PT Short  Term Goals - 08/30/17 1524      PT SHORT TERM GOAL #1   Title independent with initial HEP   Status Achieved           PT Long Term Goals - 08/30/17 1525      PT LONG TERM GOAL #3   Title sleep in bed 4 hours at a time   Status Partially Met     PT LONG TERM GOAL #4   Title walk without a limp   Status On-going               Plan - 08/30/17 1556    Clinical Impression Statement Cues required to correct posture with gait and standing hip ext/abd. No reports of increase pain with today's exercises. Narrow BOS with sit to stand. Reports she can only walk 1.5 miles before having L posterior thigh pain.   Rehab Potential Good   PT Frequency 2x / week   PT Duration 8 weeks   PT Treatment/Interventions ADLs/Self Care Home Management;Cryotherapy;Electrical Stimulation;Iontophoresis 21m/ml  Dexamethasone;Functional mobility training;Gait training;Moist Heat;Ultrasound;Therapeutic activities;Therapeutic exercise;Neuromuscular re-education;Cognitive remediation;Patient/family education;Manual techniques   PT Next Visit Plan slowly start exercises, work on flexibility and strength      Patient will benefit from skilled therapeutic intervention in order to improve the following deficits and impairments:  Abnormal gait, Decreased activity tolerance, Decreased balance, Decreased mobility, Decreased strength, Improper body mechanics, Impaired flexibility, Pain, Increased muscle spasms, Difficulty walking, Decreased range of motion  Visit Diagnosis: Pain in left hip  Difficulty in walking, not elsewhere classified  Stiffness of left hip, not elsewhere classified     Problem List Patient Active Problem List   Diagnosis Date Noted  . Trochanteric bursitis, left hip 08/10/2017  . Osteoarthritis of right hip 07/22/2015  . 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 Acute blood loss anemia 01/04/2013  . Osteoarthritis of left knee 01/01/2013    Class: Chronic    RScot Jun PTA 08/30/2017, 3:59 PM  [place] [address]Phone: [phone]  Fax:  [phone] Name: FFIFI SCHINDLERMRN: [id number] of Birth: 506-Dec-1952

## 2017-09-01 ENCOUNTER — Ambulatory Visit: Payer: 59 | Admitting: Physical Therapy

## 2017-09-01 ENCOUNTER — Encounter: Payer: Self-pay | Admitting: Physical Therapy

## 2017-09-01 DIAGNOSIS — M25652 Stiffness of left hip, not elsewhere classified: Secondary | ICD-10-CM

## 2017-09-01 DIAGNOSIS — M25552 Pain in left hip: Secondary | ICD-10-CM

## 2017-09-01 DIAGNOSIS — R262 Difficulty in walking, not elsewhere classified: Secondary | ICD-10-CM

## 2017-09-01 NOTE — Therapy (Signed)
Diana Bowers, [place], [address] Phone: [phone]   Fax:  [phone]  Physical Therapy Treatment  Patient Details  Name: Diana Bowers MRN: [id number] Date of Birth: 13-Aug-1951 Referring Provider: Ninfa Linden  Encounter Date: 09/01/2017      PT End of Session - 09/01/17 1607    Visit Number 5   Date for PT Re-Evaluation 10/17/17   PT Start Time 7209   PT Stop Time 1618   PT Time Calculation (min) 63 min      Past Medical History:  Diagnosis Date  . Arthritis    "left knee" (01/01/2013)  . Diabetes mellitus without complication ([place])    borderline no med  . DVT of lower extremity (deep venous thrombosis) ([place]) 2007   "LLE; from birth control pill" (01/01/2013)  . GERD (gastroesophageal reflux disease)   . Headache    pt. states migraines at times  . Hypercholesteremia    Crestor  . Hypertension     Past Surgical History:  Procedure Laterality Date  . ANTERIOR CERVICAL DECOMP/DISCECTOMY FUSION  2006  . CARPAL TUNNEL RELEASE  2000   "right" (01/01/2013)  . [place]  . COLONOSCOPY    . ESOPHAGOGASTRODUODENOSCOPY    . KIDNEY DONATION  1994  . KNEE ARTHROPLASTY  01/01/2013   Procedure: COMPUTER ASSISTED TOTAL KNEE ARTHROPLASTY;  Surgeon: Jessy Oto, MD;  Location: [place];  Service: Orthopedics;  Laterality: Left;  Left computer assisted total knee replacement  . LIPOMA EXCISION  2000   back  . REPLACEMENT TOTAL KNEE  01/01/2013   "left" (01/01/2013)  . TOTAL HIP ARTHROPLASTY Right 07/22/2015   Procedure: RIGHT TOTAL HIP ARTHROPLASTY ANTERIOR APPROACH;  Surgeon: Mcarthur Rossetti, MD;  Location: [place];  Service: Orthopedics;  Laterality: Right;    There were no vitals filed for this visit.      Subjective Assessment - 09/01/17 1518    Subjective "Im just so sleepy" Pt repots that she thinks it is her sciatic nerve giving her problems.   Currently in Pain? No/denies   Pain  Score 0-No pain                         [facility] Adult PT Treatment/Exercise - 09/01/17 0001      Knee/Hip Exercises: Stretches   Passive Hamstring Stretch 10 seconds;4 reps   Piriformis Stretch Both;3 reps;10 seconds     Knee/Hip Exercises: Aerobic   Nustep L5x7'     Knee/Hip Exercises: Machines for Strengthening   Cybex Knee Extension 20# 2x10   Cybex Knee Flexion 25#2x10   Other Machine Rows & Lats 25lb 2x10     Knee/Hip Exercises: Standing   Forward Step Up Left;2 sets;10 reps;Step Height: 6"   Other Standing Knee Exercises Standing hip abd/add 2lb 2x10 ; Standing hip ext 2lb 2x10      Modalities   Modalities Electrical Stimulation;Moist Heat     Moist Heat Therapy   Number Minutes Moist Heat 15 Minutes   Moist Heat Location Hip     Electrical Stimulation   Electrical Stimulation Location left hip   Electrical Stimulation Action IFC   Electrical Stimulation Parameters R sidelying   Electrical Stimulation Goals Pain                  PT Short Term Goals - 08/30/17 1524      PT SHORT TERM GOAL #1  Title independent with initial HEP   Status Achieved           PT Long Term Goals - 09/01/17 1611      PT LONG TERM GOAL #1   Title increase AROM of flexion of the left hip to 90 degrees   Status On-going     PT LONG TERM GOAL #2   Title decrease pain 50%   Status On-going     PT LONG TERM GOAL #3   Title sleep in bed 4 hours at a time   Status Partially Met     PT LONG TERM GOAL #4   Title walk without a limp   Status On-going               Plan - 09/01/17 1608    Clinical Impression Statement Pt reports that she thinks it is her sciatic nerve giving her issues, she reports discomfort in her posterior L thigh. Pt tolerated all postural strengthening exercises well. Does report some hip tenderness with piriformis stretch.   Rehab Potential Good   PT Frequency 2x / week   PT Duration 8 weeks   PT Treatment/Interventions  ADLs/Self Care Home Management;Cryotherapy;Electrical Stimulation;Iontophoresis 19m/ml Dexamethasone;Functional mobility training;Gait training;Moist Heat;Ultrasound;Therapeutic activities;Therapeutic exercise;Neuromuscular re-education;Cognitive remediation;Patient/family education;Manual techniques   PT Next Visit Plan slowly start exercises, work on flexibility and strength      Patient will benefit from skilled therapeutic intervention in order to improve the following deficits and impairments:  Abnormal gait, Decreased activity tolerance, Decreased balance, Decreased mobility, Decreased strength, Improper body mechanics, Impaired flexibility, Pain, Increased muscle spasms, Difficulty walking, Decreased range of motion  Visit Diagnosis: Difficulty in walking, not elsewhere classified  Pain in left hip  Stiffness of left hip, not elsewhere classified     Problem List Patient Active Problem List   Diagnosis Date Noted  . Trochanteric bursitis, left hip 08/10/2017  . Osteoarthritis of right hip 07/22/2015  . 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 Acute blood loss anemia 01/04/2013  . Osteoarthritis of left knee 01/01/2013    Class: Chronic    RScot Jun PTA 09/01/2017, 4:18 PM  [place] [address]Phone: [phone]  Fax:  [phone] Name: Diana SPRUNGERMRN: [id number] of Birth: 5Apr 24, 1952

## 2017-09-05 ENCOUNTER — Ambulatory Visit: Payer: 59 | Admitting: Physical Therapy

## 2017-09-05 ENCOUNTER — Encounter: Payer: Self-pay | Admitting: Physical Therapy

## 2017-09-05 DIAGNOSIS — M25552 Pain in left hip: Secondary | ICD-10-CM

## 2017-09-05 DIAGNOSIS — M25652 Stiffness of left hip, not elsewhere classified: Secondary | ICD-10-CM

## 2017-09-05 DIAGNOSIS — R262 Difficulty in walking, not elsewhere classified: Secondary | ICD-10-CM

## 2017-09-05 NOTE — Therapy (Signed)
[facility] [place] [facility] [address] [place], [place], [address] Phone: [phone]   Fax:  [phone]  Physical Therapy Treatment  Patient Details  Name: Diana Bowers MRN: [id number] Date of Birth: 20-Jan-1951 Referring Provider: Magnus Ivan  Encounter Date: 09/05/2017      PT End of Session - 09/05/17 0848    Visit Number 6   Date for PT Re-Evaluation 10/17/17   PT Start Time 0803   PT Stop Time 0903   PT Time Calculation (min) 60 min   Activity Tolerance Patient tolerated treatment well   Behavior During Therapy [facility] for tasks assessed/performed      Past Medical History:  Diagnosis Date  . Arthritis    "left knee" (01/01/2013)  . Diabetes mellitus without complication (HCC)    borderline no med  . DVT of lower extremity (deep venous thrombosis) (HCC) 2007   "LLE; from birth control pill" (01/01/2013)  . GERD (gastroesophageal reflux disease)   . Headache    pt. states migraines at times  . Hypercholesteremia    Crestor  . Hypertension     Past Surgical History:  Procedure Laterality Date  . ANTERIOR CERVICAL DECOMP/DISCECTOMY FUSION  2006  . CARPAL TUNNEL RELEASE  2000   "right" (01/01/2013)  . CESAREAN SECTION  1978  . COLONOSCOPY    . ESOPHAGOGASTRODUODENOSCOPY    . KIDNEY DONATION  1994  . KNEE ARTHROPLASTY  01/01/2013   Procedure: COMPUTER ASSISTED TOTAL KNEE ARTHROPLASTY;  Surgeon: Kerrin Champagne, MD;  [facility];  Service: Orthopedics;  Laterality: Left;  Left computer assisted total knee replacement  . LIPOMA EXCISION  2000   back  . REPLACEMENT TOTAL KNEE  01/01/2013   "left" (01/01/2013)  . TOTAL HIP ARTHROPLASTY Right 07/22/2015   Procedure: RIGHT TOTAL HIP ARTHROPLASTY ANTERIOR APPROACH;  Surgeon: Kathryne Hitch, MD;  Location: [facility] OR;  Service: Orthopedics;  Laterality: Right;    There were no vitals filed for this visit.          [facility] PT Assessment - 09/05/17 0001      AROM   Overall  AROM Comments Lumbar ROM WFL, L hip flex 90 deg     Strength   Overall Strength Comments left hip 4/5 with pain                     [facility] Adult PT Treatment/Exercise - 09/05/17 0001      Knee/Hip Exercises: Aerobic   Nustep L5x7'     Knee/Hip Exercises: Machines for Strengthening   Cybex Knee Extension 15# 2x10   Cybex Knee Flexion 25#2x10   Cybex Leg Press 20lb 2x10    Other Machine Rows & Lats 25lb 3x10     Knee/Hip Exercises: Standing   Other Standing Knee Exercises Standing hip abd/add 2lb 2x10 ; Standing hip ext 3lb 2x10      Knee/Hip Exercises: Supine   Bridges Limitations 5" 1x10     Modalities   Modalities Electrical Stimulation;Moist Heat     Moist Heat Therapy   Number Minutes Moist Heat 15 Minutes   Moist Heat Location Hip     Electrical Stimulation   Electrical Stimulation Location left hip   Electrical Stimulation Action IFC   Electrical Stimulation Parameters R sidelying   Electrical Stimulation Goals Pain                  PT Short Term Goals - 08/30/17 1524  PT SHORT TERM GOAL #1   Title independent with initial HEP   Status Achieved           PT Long Term Goals - 09/05/17 0834      PT LONG TERM GOAL #5   Title increase left hip strength to 4/5   Status Achieved               Plan - 09/05/17 0849    Clinical Impression Statement Pt had progressed meeting her hip L hip strength and ROM goal. Pt still walks with mild limp. Reports sleeping in her bed and sleeping all night. Reports she still has pain as soon as she wakes up below her L buttocks. No issues with today's interventions.   Rehab Potential Good   PT Frequency 2x / week   PT Duration 8 weeks   PT Next Visit Plan slowly start exercises, work on flexibility and strength      Patient will benefit from skilled therapeutic intervention in order to improve the following deficits and impairments:  Abnormal gait, Decreased activity tolerance, Decreased  balance, Decreased mobility, Decreased strength, Improper body mechanics, Impaired flexibility, Pain, Increased muscle spasms, Difficulty walking, Decreased range of motion  Visit Diagnosis: Difficulty in walking, not elsewhere classified  Pain in left hip  Stiffness of left hip, not elsewhere classified     Problem List Patient Active Problem List   Diagnosis Date Noted  . Trochanteric bursitis, left hip 08/10/2017  . Osteoarthritis of right hip 07/22/2015  . 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 Acute blood loss anemia 01/04/2013  . Osteoarthritis of left knee 01/01/2013    Class: Chronic    Grayce Sessionsonald G Pemberton, PTA 09/05/2017, 8:52 AM  [facility] [place] [facility] [address] [place], [place], [address] Phone: [phone]   Fax:  [phone]  Name: Diana Bowers MRN: [id number] Date of Birth: 1951/11/28

## 2017-09-07 ENCOUNTER — Ambulatory Visit (INDEPENDENT_AMBULATORY_CARE_PROVIDER_SITE_OTHER): Payer: 59 | Admitting: Orthopaedic Surgery

## 2017-09-07 DIAGNOSIS — M7062 Trochanteric bursitis, left hip: Secondary | ICD-10-CM

## 2017-09-07 NOTE — Progress Notes (Signed)
The patient is following up after physical therapy for left hip trochanteric bursitis combined with a steroid injection over the trochanteric area. Therapy is helped greatly as well as the injection.  On examination she tolerates me more easily putting her left hip to internal rotation rotation. She has minimal tenderness over trochanteric area. She does have a history of a left total knee arthroplasty as well as a right total hip arthroplasty. I think this to offer her gait is well-known to the bursitis. She is satisfied overall. All questions were encouraged and answered.  This point we'll have her follow up as needed.

## 2017-09-09 ENCOUNTER — Ambulatory Visit: Payer: 59 | Admitting: Physical Therapy

## 2017-09-13 ENCOUNTER — Ambulatory Visit: Payer: 59 | Admitting: Physical Therapy

## 2017-09-13 ENCOUNTER — Encounter: Payer: Self-pay | Admitting: Physical Therapy

## 2017-09-13 DIAGNOSIS — M25552 Pain in left hip: Secondary | ICD-10-CM | POA: Diagnosis not present

## 2017-09-13 DIAGNOSIS — M25652 Stiffness of left hip, not elsewhere classified: Secondary | ICD-10-CM

## 2017-09-13 DIAGNOSIS — R262 Difficulty in walking, not elsewhere classified: Secondary | ICD-10-CM

## 2017-09-13 NOTE — Therapy (Signed)
[facility] [place] [facility] [address] [place], [place], [address] Phone: [phone]   Fax:  [phone]  Physical Therapy Treatment  Patient Details  Name: Diana Bowers MRN: [id number] Date of Birth: 11/24/1951 Referring Provider: Magnus Ivan  Encounter Date: 09/13/2017      PT End of Session - 09/13/17 1513    Visit Number 7   Date for PT Re-Evaluation 10/17/17   PT Start Time 1430   PT Stop Time 1513   PT Time Calculation (min) 43 min   Activity Tolerance Patient tolerated treatment well   Behavior During Therapy [facility] for tasks assessed/performed      Past Medical History:  Diagnosis Date  . Arthritis    "left knee" (01/01/2013)  . Diabetes mellitus without complication (HCC)    borderline no med  . DVT of lower extremity (deep venous thrombosis) (HCC) 2007   "LLE; from birth control pill" (01/01/2013)  . GERD (gastroesophageal reflux disease)   . Headache    pt. states migraines at times  . Hypercholesteremia    Crestor  . Hypertension     Past Surgical History:  Procedure Laterality Date  . ANTERIOR CERVICAL DECOMP/DISCECTOMY FUSION  2006  . CARPAL TUNNEL RELEASE  2000   "right" (01/01/2013)  . CESAREAN SECTION  1978  . COLONOSCOPY    . ESOPHAGOGASTRODUODENOSCOPY    . KIDNEY DONATION  1994  . KNEE ARTHROPLASTY  01/01/2013   Procedure: COMPUTER ASSISTED TOTAL KNEE ARTHROPLASTY;  Surgeon: Kerrin Champagne, MD;  [facility]: [facility] OR;  Service: Orthopedics;  Laterality: Left;  Left computer assisted total knee replacement  . LIPOMA EXCISION  2000   back  . REPLACEMENT TOTAL KNEE  01/01/2013   "left" (01/01/2013)  . TOTAL HIP ARTHROPLASTY Right 07/22/2015   Procedure: RIGHT TOTAL HIP ARTHROPLASTY ANTERIOR APPROACH;  Surgeon: Kathryne Hitch, MD;  Location: [facility] OR;  Service: Orthopedics;  Laterality: Right;    There were no vitals filed for this visit.      Subjective Assessment - 09/13/17 1428    Subjective Pt reports  that's she is sleepy today.    Currently in Pain? No/denies   Pain Score 0-No pain                         [facility] Adult PT Treatment/Exercise - 09/13/17 0001      Ambulation/Gait   Stairs Yes   Stairs Assistance 6: Modified independent (Device/Increase time);5: Supervision   Stair Management Technique One rail Right;Alternating pattern   Number of Stairs 24   Height of Stairs 6   Gait Comments Reprots some R knee pain with desents, lands hard on LLE.      Knee/Hip Exercises: Aerobic   Elliptical I5 R5 8frd/2rev   Nustep L5 x6 min     Knee/Hip Exercises: Machines for Strengthening   Cybex Knee Extension 15# 2x10   Cybex Knee Flexion 25#2x10   Cybex Leg Press 30lb 2x10    Other Machine Rows & Lats 25lb 3x10     Knee/Hip Exercises: Plyometrics   Bilateral Jumping 2 sets     Knee/Hip Exercises: Standing   Other Standing Knee Exercises Standing hip abd/add 2lb 2x10 ; Standing hip ext 5lb  pulley 2x10    Other Standing Knee Exercises Standing straight arm pul downs 25lb 2x10     Knee/Hip Exercises: Seated   Other Seated Knee/Hip Exercises curnches with physo ball 2x10  PT Short Term Goals - 08/30/17 1524      PT SHORT TERM GOAL #1   Title independent with initial HEP   Status Achieved           PT Long Term Goals - 09/05/17 0834      PT LONG TERM GOAL #5   Title increase left hip strength to 4/5   Status Achieved               Plan - 09/13/17 1513    Clinical Impression Statement Pt stated that she had a good weekend with decrease pain overall. Progressed to stair negotiation and she reports some R knee pain with descent. All machine level interventions completed well. Denied modality post treatment.   Rehab Potential Good   PT Frequency 2x / week   PT Duration 8 weeks   PT Treatment/Interventions ADLs/Self Care Home Management;Cryotherapy;Electrical Stimulation;Iontophoresis /ml Dexamethasone;Functional  mobility training;Gait training;Moist Heat;Ultrasound;Therapeutic activities;Therapeutic exercise;Neuromuscular re-education;Cognitive remediation;Patient/family education;Manual techniques   PT Next Visit Plan work on flexibility and strength      Patient will benefit from skilled therapeutic intervention in order to improve the following deficits and impairments:  Abnormal gait, Decreased activity tolerance, Decreased balance, Decreased mobility, Decreased strength, Improper body mechanics, Impaired flexibility, Pain, Increased muscle spasms, Difficulty walking, Decreased range of motion  Visit Diagnosis: Difficulty in walking, not elsewhere classified  Pain in left hip  Stiffness of left hip, not elsewhere classified     Problem List Patient Active Problem List   Diagnosis Date Noted  . Trochanteric bursitis, left hip 08/10/2017  . Osteoarthritis of right hip 07/22/2015  . 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 Acute blood loss anemia 01/04/2013  . Osteoarthritis of left knee 01/01/2013    Class: Chronic    Grayce Sessions, PTA 09/13/2017, 3:15 PM  [facility] [place] [facility] [address] [place], [place], [address] Phone: [phone]   Fax:  [phone]  Name: Diana Bowers MRN: [id number] Date of Birth: [birth year]

## 2017-09-15 ENCOUNTER — Encounter: Payer: Self-pay | Admitting: Physical Therapy

## 2017-09-15 ENCOUNTER — Ambulatory Visit: Payer: 59 | Admitting: Physical Therapy

## 2017-09-15 DIAGNOSIS — M25652 Stiffness of left hip, not elsewhere classified: Secondary | ICD-10-CM

## 2017-09-15 DIAGNOSIS — M25552 Pain in left hip: Secondary | ICD-10-CM | POA: Diagnosis not present

## 2017-09-15 DIAGNOSIS — R262 Difficulty in walking, not elsewhere classified: Secondary | ICD-10-CM

## 2017-09-15 NOTE — Therapy (Signed)
[facility] [place] [facility] [address] [place], [place], [address] Phone: [phone]   Fax:  [phone]  Physical Therapy Treatment  Patient Details  Name: Diana Bowers MRN: [id number] Date of Birth: [birth year] Referring Provider: Magnus Ivan  Encounter Date: 09/15/2017      PT End of Session - 09/15/17 1343    Visit Number 8   Date for PT Re-Evaluation 10/17/17   PT Start Time 1300   PT Stop Time 1343   PT Time Calculation (min) 43 min   Activity Tolerance Patient tolerated treatment well   Behavior During Therapy [facility] for tasks assessed/performed      Past Medical History:  Diagnosis Date  . Arthritis    "left knee" (01/01/2013)  . Diabetes mellitus without complication (HCC)    borderline no med  . DVT of lower extremity (deep venous thrombosis) (HCC) 2007   "LLE; from birth control pill" (01/01/2013)  . GERD (gastroesophageal reflux disease)   . Headache    pt. states migraines at times  . Hypercholesteremia    Crestor  . Hypertension     Past Surgical History:  Procedure Laterality Date  . ANTERIOR CERVICAL DECOMP/DISCECTOMY FUSION  2006  . CARPAL TUNNEL RELEASE  2000   "right" (01/01/2013)  . CESAREAN SECTION  1978  . COLONOSCOPY    . ESOPHAGOGASTRODUODENOSCOPY    . KIDNEY DONATION  1994  . KNEE ARTHROPLASTY  01/01/2013   Procedure: COMPUTER ASSISTED TOTAL KNEE ARTHROPLASTY;  Surgeon: Kerrin Champagne, MD;  [facility]: [facility] OR;  Service: Orthopedics;  Laterality: Left;  Left computer assisted total knee replacement  . LIPOMA EXCISION  2000   back  . REPLACEMENT TOTAL KNEE  01/01/2013   "left" (01/01/2013)  . TOTAL HIP ARTHROPLASTY Right 07/22/2015   Procedure: RIGHT TOTAL HIP ARTHROPLASTY ANTERIOR APPROACH;  Surgeon: Kathryne Hitch, MD;  Location: [facility] OR;  Service: Orthopedics;  Laterality: Right;    There were no vitals filed for this visit.      Subjective Assessment - 09/15/17 1302    Subjective "This is a lazy  part of the day, Im so sleepy"   Currently in Pain? No/denies   Pain Score 0-No pain                         [facility] Adult PT Treatment/Exercise - 09/15/17 0001      Ambulation/Gait   Ambulation/Gait Yes   Ambulation/Gait Assistance 7: Independent;6: Modified independent (Device/Increase time)   Ambulation Distance (Feet) --  >700   Assistive device None   Gait Pattern Within Functional Limits;Step-through pattern;Trunk flexed   Ambulation Surface Unlevel;Outdoor;Paved     Knee/Hip Exercises: Aerobic   Elliptical I5 R5 7frd/2rev   Nustep L5 x6 min     Knee/Hip Exercises: Machines for Strengthening   Cybex Knee Extension 15# 2x10   Cybex Knee Flexion 25#2x10   Cybex Leg Press 30lb 2x10    Other Machine Rows & Lats 25lb 3x15     Knee/Hip Exercises: Standing   Other Standing Knee Exercises  Standing hip ext and abd 5lb  pulley 2x10    Other Standing Knee Exercises Standing straight arm pul downs 25lb 2x10                  PT Short Term Goals - 08/30/17 1524      PT SHORT TERM GOAL #1   Title independent with initial HEP   Status Achieved  PT Long Term Goals - 09/15/17 1343      PT LONG TERM GOAL #4   Status On-going               Plan - 09/15/17 1344    Clinical Impression Statement Pt with a progression to outdoor ambulation without reports of increase hip pain. Pt doe demos some fatigue with up hill amb. Good strength and ROM with all machine level interventions. Some postural ques given during standing hip exercises.   Rehab Potential Good   PT Frequency 2x / week   PT Duration 8 weeks   PT Treatment/Interventions ADLs/Self Care Home Management;Cryotherapy;Electrical Stimulation;Iontophoresis /ml Dexamethasone;Functional mobility training;Gait training;Moist Heat;Ultrasound;Therapeutic activities;Therapeutic exercise;Neuromuscular re-education;Cognitive remediation;Patient/family education;Manual techniques   PT Next  Visit Plan work on flexibility and strength      Patient will benefit from skilled therapeutic intervention in order to improve the following deficits and impairments:  Abnormal gait, Decreased activity tolerance, Decreased balance, Decreased mobility, Decreased strength, Improper body mechanics, Impaired flexibility, Pain, Increased muscle spasms, Difficulty walking, Decreased range of motion  Visit Diagnosis: Difficulty in walking, not elsewhere classified  Pain in left hip  Stiffness of left hip, not elsewhere classified     Problem List Patient Active Problem List   Diagnosis Date Noted  . Trochanteric bursitis, left hip 08/10/2017  . Osteoarthritis of right hip 07/22/2015  . 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 Acute blood loss anemia 01/04/2013  . Osteoarthritis of left knee 01/01/2013    Class: Chronic    Diana Bowers, PTA 09/15/2017, 1:46 PM  [facility] [place] [facility] [address] [place], [place], [address] Phone: [phone]   Fax:  [phone]  Name: Diana Bowers MRN: [id number] Date of Birth: 09/14/1951

## 2017-09-19 ENCOUNTER — Encounter: Payer: Self-pay | Admitting: Physical Therapy

## 2017-09-19 ENCOUNTER — Ambulatory Visit: Payer: 59 | Admitting: Physical Therapy

## 2017-09-19 DIAGNOSIS — R262 Difficulty in walking, not elsewhere classified: Secondary | ICD-10-CM

## 2017-09-19 DIAGNOSIS — M25552 Pain in left hip: Secondary | ICD-10-CM | POA: Diagnosis not present

## 2017-09-19 DIAGNOSIS — M25652 Stiffness of left hip, not elsewhere classified: Secondary | ICD-10-CM

## 2017-09-19 NOTE — Therapy (Addendum)
[place], [place], [address] Phone: [phone]   Fax:  [phone]  Physical Therapy Treatment  Patient Details  Name: Diana Bowers MRN: [id number] Date of Birth: [birth year] Referring Provider: Ninfa Linden  Encounter Date: 09/19/2017      PT End of Session - 09/19/17 1222    Visit Number 9   Date for PT Re-Evaluation 10/17/17   PT Start Time 1142   PT Stop Time 1222   PT Time Calculation (min) 40 min   Activity Tolerance Patient tolerated treatment well   Behavior During Therapy [facility] for tasks assessed/performed      Past Medical History:  Diagnosis Date  . Arthritis    "left knee" (01/01/2013)  . Diabetes mellitus without complication ([place])    borderline no med  . DVT of lower extremity (deep venous thrombosis) ([place]) 2007   "LLE; from birth control pill" (01/01/2013)  . GERD (gastroesophageal reflux disease)   . Headache    pt. states migraines at times  . Hypercholesteremia    Crestor  . Hypertension     Past Surgical History:  Procedure Laterality Date  . ANTERIOR CERVICAL DECOMP/DISCECTOMY FUSION  2006  . CARPAL TUNNEL RELEASE  2000   "right" (01/01/2013)  . [place]  . COLONOSCOPY    . ESOPHAGOGASTRODUODENOSCOPY    . KIDNEY DONATION  1994  . KNEE ARTHROPLASTY  01/01/2013   Procedure: COMPUTER ASSISTED TOTAL KNEE ARTHROPLASTY;  Surgeon: Jessy Oto, MD;  Location: [place] [facility];  Service: Orthopedics;  Laterality: Left;  Left computer assisted total knee replacement  . LIPOMA EXCISION  2000   back  . REPLACEMENT TOTAL KNEE  01/01/2013   "left" (01/01/2013)  . TOTAL HIP ARTHROPLASTY Right 07/22/2015   Procedure: RIGHT TOTAL HIP ARTHROPLASTY ANTERIOR APPROACH;  Surgeon: Mcarthur Rossetti, MD;  Location: [place];  Service: Orthopedics;  Laterality: Right;    There were no vitals filed for this visit.      Subjective Assessment - 09/19/17 1144    Subjective "I feel good"   Currently in Pain? No/denies   Pain Score 0-No pain                         [facility] Adult PT Treatment/Exercise - 09/19/17 0001      Ambulation/Gait   Gait Comments @ laps arounf building up and down slopes over uneven terrain. Pt does ambulate with decrease L knee flexion during swing phase.      Knee/Hip Exercises: Aerobic   Elliptical I5 R5 87fd/2rev   Nustep L5 x6 min     Knee/Hip Exercises: Machines for Strengthening   Other Machine Rows & Lats 35lb 2x10     Knee/Hip Exercises: Standing   Hip Flexion Both;1 set;10 reps;Knee bent  TKE at end    Hip Flexion Limitations 2   Hip Abduction 1 set;15 reps;Both;Knee straight   Abduction Limitations 2   Other Standing Knee Exercises Standing march 2x10                   PT Short Term Goals - 08/30/17 1524      PT SHORT TERM GOAL #1   Title independent with initial HEP   Status Achieved           PT Long Term Goals - 09/19/17 1145      PT LONG TERM GOAL #2   Title decrease pain 50%  Status Partially Met     PT LONG TERM GOAL #3   Title sleep in bed 4 hours at a time   Status Achieved               Plan - 09/19/17 1222    Clinical Impression Statement Pt progressing well, increase outdoor ambulation distance with little compensation. Some difficulty with standing hip flex a then TKE.    Rehab Potential Good   PT Frequency 2x / week   PT Duration 8 weeks   PT Treatment/Interventions ADLs/Self Care Home Management;Cryotherapy;Electrical Stimulation;Iontophoresis 55m/ml Dexamethasone;Functional mobility training;Gait training;Moist Heat;Ultrasound;Therapeutic activities;Therapeutic exercise;Neuromuscular re-education;Cognitive remediation;Patient/family education;Manual techniques   PT Next Visit Plan work on flexibility and strength      Patient will benefit from skilled therapeutic intervention in order to improve the following deficits and impairments:  Abnormal gait, Decreased  activity tolerance, Decreased balance, Decreased mobility, Decreased strength, Improper body mechanics, Impaired flexibility, Pain, Increased muscle spasms, Difficulty walking, Decreased range of motion  Visit Diagnosis: Difficulty in walking, not elsewhere classified  Stiffness of left hip, not elsewhere classified  Pain in left hip     Problem List Patient Active Problem List   Diagnosis Date Noted  . Trochanteric bursitis, left hip 08/10/2017  . Osteoarthritis of right hip 07/22/2015  . 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 Acute blood loss anemia 01/04/2013  . Osteoarthritis of left knee 01/01/2013    Class: Chronic   PHYSICAL THERAPY DISCHARGE SUMMARY  Visits from Start of Care: 9  Plan: Patient agrees to discharge.  Patient goals were partially met. Patient is being discharged due to being pleased with the current functional level.  ?????     RScot Jun PTA 09/19/2017, 12:23 PM  [place] [address]Phone: [phone]  Fax:  [phone] Name: Diana NANNIMRN: [id number] of Birth: 503-11-1951

## 2017-09-21 ENCOUNTER — Ambulatory Visit: Payer: 59 | Admitting: Physical Therapy

## 2017-11-08 ENCOUNTER — Other Ambulatory Visit: Payer: Self-pay | Admitting: Family Medicine

## 2017-11-08 DIAGNOSIS — N6310 Unspecified lump in the right breast, unspecified quadrant: Secondary | ICD-10-CM

## 2017-11-15 ENCOUNTER — Ambulatory Visit
Admission: RE | Admit: 2017-11-15 | Discharge: 2017-11-15 | Disposition: A | Discharge status: Home / Self Care | Payer: 59 | Source: Ambulatory Visit | Attending: Family Medicine | Admitting: Family Medicine

## 2017-11-15 ENCOUNTER — Other Ambulatory Visit: Payer: Self-pay | Admitting: Family Medicine

## 2017-11-15 DIAGNOSIS — N6310 Unspecified lump in the right breast, unspecified quadrant: Secondary | ICD-10-CM

## 2018-02-08 ENCOUNTER — Ambulatory Visit (INDEPENDENT_AMBULATORY_CARE_PROVIDER_SITE_OTHER): Payer: 59 | Admitting: Orthopaedic Surgery

## 2018-02-08 ENCOUNTER — Encounter (INDEPENDENT_AMBULATORY_CARE_PROVIDER_SITE_OTHER): Payer: Self-pay | Admitting: Orthopaedic Surgery

## 2018-02-08 DIAGNOSIS — M25562 Pain in left knee: Secondary | ICD-10-CM

## 2018-02-08 DIAGNOSIS — M1711 Unilateral primary osteoarthritis, right knee: Secondary | ICD-10-CM

## 2018-02-08 DIAGNOSIS — M7062 Trochanteric bursitis, left hip: Secondary | ICD-10-CM | POA: Diagnosis not present

## 2018-02-08 DIAGNOSIS — Z96652 Presence of left artificial knee joint: Secondary | ICD-10-CM | POA: Diagnosis not present

## 2018-02-08 DIAGNOSIS — M1612 Unilateral primary osteoarthritis, left hip: Secondary | ICD-10-CM

## 2018-02-08 MED ORDER — TRAMADOL HCL 50 MG PO TABS: 50.0000 mg | ORAL_TABLET | Freq: Two times a day (BID) | ORAL | 0 refills | Status: DC | PRN

## 2018-02-08 NOTE — Progress Notes (Signed)
Office Visit Note   Patient: Diana Bowers           Date of Birth: [birth year]           MRN: [id number] Visit Date: 02/08/2018              Requested by: Caffie DammeSmith, Karla, MD [address] [place], [place] [id number] PCP: Caffie DammeSmith, Karla, MD   Assessment & Plan: Visit Diagnoses:  1. Trochanteric bursitis, left hip     Plan: At this point she is tried all forms of conservative treatment.  I did offer her one more steroid injection and she agreed to this.  Is been a while since her last injection.  Which is been over 6 months now.  She tolerated the steroid injection well.  She will follow-up as needed.  Obviously if this becomes a chronic issue and injections and stretching and therapy do not help we can consider surgery.  She will follow-up as needed otherwise.  Follow-Up Instructions: Return if symptoms worsen or fail to improve.   Orders:  No orders of the defined types were placed in this encounter.  No orders of the defined types were placed in this encounter.     Procedures: No procedures performed   Clinical Data: No additional findings.   Subjective: Chief Complaint  Patient presents with  . Left Hip - Follow-up, Pain  The patient comes in today with severe left hip pain and a previous history of left hip trochanteric bursitis.  She would like to have an injection again.  She still denies any groin pain.  She has a history of a right total hip arthroplasty that we replaced 3 years ago.  She said that is doing well.  HPI  Review of Systems She currently denies any headache, chest pain, shortness of breath, fever, chills, nausea, vomiting.  Objective: Vital Signs: There were no vitals taken for this visit.  Physical Exam She is alert and oriented x3 and in no acute distress Ortho Exam Examination of her right hip is normal examination of her left hip shows fluid and full range of motion with no pain in the groin.  She has severe pain of the trochanteric area and IT  band. Specialty Comments:  No specialty comments available.  Imaging: No results found.   PMFS History: Patient Active Problem List   Diagnosis Date Noted  . Trochanteric bursitis, left hip 08/10/2017  . Osteoarthritis of right hip 07/22/2015  . 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 Acute blood loss anemia 01/04/2013  . Osteoarthritis of left knee 01/01/2013    Class: Chronic   Past Medical History:  Diagnosis Date  . Arthritis    "left knee" (01/01/2013)  . Diabetes mellitus without complication (HCC)    borderline no med  . DVT of lower extremity (deep venous thrombosis) (HCC) 2007   "LLE; from birth control pill" (01/01/2013)  . GERD (gastroesophageal reflux disease)   . Headache    pt. states migraines at times  . Hypercholesteremia    Crestor  . Hypertension     No family history on file.  Past Surgical History:  Procedure Laterality Date  . ANTERIOR CERVICAL DECOMP/DISCECTOMY FUSION  2006  . CARPAL TUNNEL RELEASE  2000   "right" (01/01/2013)  . CESAREAN SECTION  1978  . COLONOSCOPY    . ESOPHAGOGASTRODUODENOSCOPY    . KIDNEY DONATION  1994  . KNEE ARTHROPLASTY  01/01/2013   Procedure: COMPUTER ASSISTED TOTAL KNEE  ARTHROPLASTY;  Surgeon: Kerrin Champagne, MD;  Location: [facility] OR;  Service: Orthopedics;  Laterality: Left;  Left computer assisted total knee replacement  . LIPOMA EXCISION  2000   back  . REPLACEMENT TOTAL KNEE  01/01/2013   "left" (01/01/2013)  . TOTAL HIP ARTHROPLASTY Right 07/22/2015   Procedure: RIGHT TOTAL HIP ARTHROPLASTY ANTERIOR APPROACH;  Surgeon: Kathryne Hitch, MD;  Location: [facility] OR;  Service: Orthopedics;  Laterality: Right;   Social History   Occupational History  . Not on file  Tobacco Use  . Smoking status: Never Smoker  . Smokeless tobacco: Never Used  Substance and Sexual Activity  . Alcohol use: No  . Drug use: No  . Sexual activity: Yes

## 2018-02-13 ENCOUNTER — Ambulatory Visit
Admission: RE | Admit: 2018-02-13 | Discharge: 2018-02-13 | Disposition: A | Discharge status: Home / Self Care | Payer: 59 | Source: Ambulatory Visit | Attending: Family Medicine | Admitting: Family Medicine

## 2018-02-13 DIAGNOSIS — N6310 Unspecified lump in the right breast, unspecified quadrant: Secondary | ICD-10-CM

## 2018-02-27 ENCOUNTER — Other Ambulatory Visit: Payer: Self-pay | Admitting: Internal Medicine

## 2018-02-27 DIAGNOSIS — Z1231 Encounter for screening mammogram for malignant neoplasm of breast: Secondary | ICD-10-CM

## 2018-03-31 ENCOUNTER — Ambulatory Visit: Payer: 59

## 2018-04-05 ENCOUNTER — Ambulatory Visit
Admission: RE | Admit: 2018-04-05 | Discharge: 2018-04-05 | Disposition: A | Discharge status: Home / Self Care | Payer: 59 | Source: Ambulatory Visit | Attending: Internal Medicine | Admitting: Internal Medicine

## 2018-04-05 ENCOUNTER — Other Ambulatory Visit: Payer: Self-pay | Admitting: Family Medicine

## 2018-04-05 ENCOUNTER — Other Ambulatory Visit: Payer: Self-pay | Admitting: Internal Medicine

## 2018-04-05 ENCOUNTER — Ambulatory Visit
Admission: RE | Admit: 2018-04-05 | Discharge: 2018-04-05 | Disposition: A | Discharge status: Home / Self Care | Payer: 59 | Source: Ambulatory Visit | Attending: Family Medicine | Admitting: Family Medicine

## 2018-04-05 DIAGNOSIS — Z1231 Encounter for screening mammogram for malignant neoplasm of breast: Secondary | ICD-10-CM

## 2018-04-05 DIAGNOSIS — N6310 Unspecified lump in the right breast, unspecified quadrant: Secondary | ICD-10-CM

## 2018-05-10 ENCOUNTER — Telehealth (INDEPENDENT_AMBULATORY_CARE_PROVIDER_SITE_OTHER): Payer: Self-pay | Admitting: Orthopaedic Surgery

## 2018-05-10 NOTE — Telephone Encounter (Signed)
Yes, that's fine 

## 2018-05-10 NOTE — Telephone Encounter (Signed)
Patient sees Dr. Magnus Ivan for her left hip but sees Dr. Otelia Sergeant for her left knee, she had a knee replacement done by him a few years ago. She scheduled 2 different appointments but is wondering if she can just see Dr. Magnus Ivan for both problems instead ?  If she can, she would like to cancel the appt made with Dr. Otelia Sergeant Please advise # [phone]

## 2018-05-16 ENCOUNTER — Ambulatory Visit (INDEPENDENT_AMBULATORY_CARE_PROVIDER_SITE_OTHER): Payer: Medicare Other

## 2018-05-16 ENCOUNTER — Ambulatory Visit (INDEPENDENT_AMBULATORY_CARE_PROVIDER_SITE_OTHER): Payer: Medicare Other | Admitting: Orthopaedic Surgery

## 2018-05-16 ENCOUNTER — Encounter (INDEPENDENT_AMBULATORY_CARE_PROVIDER_SITE_OTHER): Payer: Self-pay | Admitting: Orthopaedic Surgery

## 2018-05-16 DIAGNOSIS — G8929 Other chronic pain: Secondary | ICD-10-CM

## 2018-05-16 DIAGNOSIS — M7062 Trochanteric bursitis, left hip: Secondary | ICD-10-CM | POA: Diagnosis not present

## 2018-05-16 DIAGNOSIS — M25562 Pain in left knee: Secondary | ICD-10-CM

## 2018-05-16 DIAGNOSIS — Z96652 Presence of left artificial knee joint: Secondary | ICD-10-CM | POA: Diagnosis not present

## 2018-05-16 MED ORDER — METHYLPREDNISOLONE ACETATE 40 MG/ML IJ SUSP
40.0000 mg | INTRAMUSCULAR | Status: AC | PRN
  Administered 2018-05-16: 40 mg via INTRA_ARTICULAR

## 2018-05-16 MED ORDER — LIDOCAINE HCL 1 % IJ SOLN
3.0000 mL | INTRAMUSCULAR | Status: AC | PRN
  Administered 2018-05-16: 3 mL

## 2018-05-16 NOTE — Progress Notes (Signed)
Office Visit Note   Patient: Diana Bowers           Date of Birth: 11-Mar-1951           MRN: [id number] Visit Date: 05/16/2018              Requested by: Caffie Damme, MD [address] [place], [place] [address] PCP: Caffie Damme, MD   Assessment & Plan: Visit Diagnoses:  1. Chronic pain of left knee   2. Trochanteric bursitis, left hip   3. History of total left knee replacement (TKR)     Plan: As far as her knee goes I did review her operative note and I do feel that she needs upsizing her polyethylene liner due to stretching ligaments of time.  She has a 10 mm polyethylene insert and I do feel that she likely needs a 12.5 based on my examination with varus and valgus stressing and drawer sign.  She is going to think about this.  I showed her knee model explained in detail with the surgery involves.  I did provide a steroid injection in her trochanteric area.  I do feel if her knee felt more stable to her this would lessen her stress on her hip and lessen her developing trochanteric bursitis.  All question concerns were answered and addressed.  She will follow-up as needed but does understand that if she would like to have the surgery on her left knee that she will let us know.  Follow-Up Instructions: Return if symptoms worsen or fail to improve.   Orders:  Orders Placed This Encounter  Procedures  . XR Knee 1-2 Views Left   No orders of the defined types were placed in this encounter.     Procedures: Large Joint Inj: L greater trochanter on 05/16/2018 9:00 AM Indications: pain and diagnostic evaluation Details: 22 G 1.5 in needle, lateral approach  Arthrogram: No  Medications: 3 mL lidocaine 1 %; 40 mg methylPREDNISolone acetate 40 MG/ML Outcome: tolerated well, no immediate complications Procedure, treatment alternatives, risks and benefits explained, specific risks discussed. Consent was given by the patient. Immediately prior to procedure a time out was called to verify  the correct patient, procedure, equipment, support staff and site/side marked as required. Patient was prepped and draped in the usual sterile fashion.       Clinical Data: No additional findings.   Subjective: Chief Complaint  Patient presents with  . Left Knee - Pain  . Left Hip - Pain  The patient is well-known to [place].  She has a history of left hip trochanteric bursitis.  We did a right hip replacement on her 3 years ago.  1 of my partners to the left total knee arthroplasty on her 5 years ago.  She says the left knee feels unstable to her and she sometimes gets symptoms like it is going to give away.  She would like to have a trochanteric injection again on her left side.  We have injected this area before.  She currently denies any acute medical changes or problems.  HPI  Review of Systems She currently denies any headache, chest pain, shortness of breath, fever, chills, nausea, vomiting.  Objective: Vital Signs: There were no vitals taken for this visit.  Physical Exam She is alert and oriented x3 and in no acute distress Ortho Exam Examination of her left hip shows fluid internal and external rotation with no pain in the groin.  Pain is only palpation of the  trochanteric area.  Examination of her left knee shows a well-healed surgical incision and good range of motion overall.  She does have some instability with varus and valgus stressing. Specialty Comments:  No specialty comments available.  Imaging: Xr Knee 1-2 Views Left  Result Date: 05/16/2018 2 views of left knee show total knee arthroplasty with no evidence of loosening.  There is slight varus positioning of the knee.    PMFS History: Patient Active Problem List   Diagnosis Date Noted  . Trochanteric bursitis, left hip 08/10/2017  . Osteoarthritis of right hip 07/22/2015  . 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 Acute blood loss anemia 01/04/2013  . Osteoarthritis of left knee 01/01/2013     Class: Chronic   Past Medical History:  Diagnosis Date  . Arthritis    "left knee" (01/01/2013)  . Diabetes mellitus without complication (HCC)    borderline no med  . DVT of lower extremity (deep venous thrombosis) (HCC) 2007   "LLE; from birth control pill" (01/01/2013)  . GERD (gastroesophageal reflux disease)   . Headache    pt. states migraines at times  . Hypercholesteremia    Crestor  . Hypertension     Family History  Problem Relation Age of Onset  . Breast cancer Neg Hx     Past Surgical History:  Procedure Laterality Date  . ANTERIOR CERVICAL DECOMP/DISCECTOMY FUSION  2006  . CARPAL TUNNEL RELEASE  2000   "right" (01/01/2013)  . CESAREAN SECTION  1978  . COLONOSCOPY    . ESOPHAGOGASTRODUODENOSCOPY    . KIDNEY DONATION  1994  . KNEE ARTHROPLASTY  01/01/2013   Procedure: COMPUTER ASSISTED TOTAL KNEE ARTHROPLASTY;  Surgeon: Kerrin Champagne, MD;  Location: [facility] OR;  Service: Orthopedics;  Laterality: Left;  Left computer assisted total knee replacement  . LIPOMA EXCISION  2000   back  . REPLACEMENT TOTAL KNEE  01/01/2013   "left" (01/01/2013)  . TOTAL HIP ARTHROPLASTY Right 07/22/2015   Procedure: RIGHT TOTAL HIP ARTHROPLASTY ANTERIOR APPROACH;  Surgeon: Kathryne Hitch, MD;  Location: [facility] OR;  Service: Orthopedics;  Laterality: Right;   Social History   Occupational History  . Not on file  Tobacco Use  . Smoking status: Never Smoker  . Smokeless tobacco: Never Used  Substance and Sexual Activity  . Alcohol use: No  . Drug use: No  . Sexual activity: Yes

## 2018-06-16 ENCOUNTER — Ambulatory Visit (INDEPENDENT_AMBULATORY_CARE_PROVIDER_SITE_OTHER): Payer: 59 | Admitting: Specialist

## 2018-06-19 ENCOUNTER — Telehealth (INDEPENDENT_AMBULATORY_CARE_PROVIDER_SITE_OTHER): Payer: Self-pay | Admitting: Orthopaedic Surgery

## 2018-06-19 DIAGNOSIS — M25562 Pain in left knee: Secondary | ICD-10-CM

## 2018-06-19 DIAGNOSIS — M1612 Unilateral primary osteoarthritis, left hip: Secondary | ICD-10-CM

## 2018-06-19 DIAGNOSIS — M1711 Unilateral primary osteoarthritis, right knee: Secondary | ICD-10-CM

## 2018-06-19 DIAGNOSIS — Z96652 Presence of left artificial knee joint: Secondary | ICD-10-CM

## 2018-06-19 MED ORDER — TRAMADOL HCL 50 MG PO TABS: 50.0000 mg | ORAL_TABLET | Freq: Two times a day (BID) | ORAL | 0 refills | Status: DC | PRN

## 2018-06-19 NOTE — Telephone Encounter (Signed)
Please advise 

## 2018-06-19 NOTE — Telephone Encounter (Signed)
Patient [facility] [place]. Patients # [phone]

## 2018-08-21 ENCOUNTER — Ambulatory Visit (INDEPENDENT_AMBULATORY_CARE_PROVIDER_SITE_OTHER): Payer: Medicare Other

## 2018-08-21 ENCOUNTER — Encounter (INDEPENDENT_AMBULATORY_CARE_PROVIDER_SITE_OTHER): Payer: Self-pay | Admitting: Orthopaedic Surgery

## 2018-08-21 ENCOUNTER — Ambulatory Visit (INDEPENDENT_AMBULATORY_CARE_PROVIDER_SITE_OTHER): Payer: Medicare Other | Admitting: Orthopaedic Surgery

## 2018-08-21 DIAGNOSIS — M25562 Pain in left knee: Secondary | ICD-10-CM

## 2018-08-21 DIAGNOSIS — G8929 Other chronic pain: Secondary | ICD-10-CM

## 2018-08-21 DIAGNOSIS — T84063A Wear of articular bearing surface of internal prosthetic left knee joint, initial encounter: Secondary | ICD-10-CM | POA: Diagnosis not present

## 2018-08-21 DIAGNOSIS — M7062 Trochanteric bursitis, left hip: Secondary | ICD-10-CM | POA: Diagnosis not present

## 2018-08-21 NOTE — Progress Notes (Signed)
Office Visit Note   Patient: Diana Bowers           Date of Birth: June 19, 1951           MRN: [id number] Visit Date: 08/21/2018              Requested by: Caffie Damme, MD [address] [place], [place] [address] PCP: Caffie Damme, MD   Assessment & Plan: Visit Diagnoses:  1. Chronic pain of left knee   2. Trochanteric bursitis, left hip   3. 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  She has a 10 mm insert and she would likely benefit from a 12.5 thickness insert versus something even a little thicker for stability purposes.  At the same time we could provide a steroid injection of the trochanteric area as well.  I do think both of these things would help her walk better and calm down her symptoms.  We a long and thorough discussion about this as we have had in the past.  We discussed the risk and benefits of this and what her intraoperative and postoperative course were involved.  At this point she does wish to proceed with this type of surgery.  All question concerns were answered and addressed.  Follow-Up Instructions: Return for 2 weeks post-op.   Orders:  Orders Placed This Encounter  Procedures  . XR Knee 1-2 Views Left   No orders of the defined types were placed in this encounter.     Procedures: No procedures performed   Clinical Data: No additional findings.   Subjective: No chief complaint on file. The patient continues to complain of left knee instability and pain as well as left hip pain.  She is had a previous history of a left total knee arthroplasty done by another surgeon.  She has had some problems with going up and down stairs and feeling unstable with that knee.  Is created pain along the trochanteric area of her left hip.  We have seen in for both issues  before.  I have recommended a polyliner exchange in the past with upsizing her polyethylene liner to give her more stability of that knee.  She is had injections of the trochanteric area before.  She is still denies any groin pain.  She does have a history of a right total hip arthroplasty and does not feel that a left hip replacement as needed.  She is more frustrated with her knee though.  HPI  Review of Systems She currently denies any headache, chest pain, shortness of breath, fever, chills, nausea, vomiting.  Objective: Vital Signs: There were no vitals taken for this visit.  Physical Exam She is alert and oriented x3 and in no acute distress Ortho Exam Examination of her left hip shows pain only of the trochanteric area to palpation and with internal extra rotation with no pain in the groin.  Her left knee is examined shows excellent range of motion but there is definitely some laxity with her drawer sign as well as with medial lateral stressing the knee.  There is no effusion of the knee. Specialty Comments:  No specialty comments available.  Imaging: Xr Knee 1-2 Views Left  Result Date: 08/21/2018 2 views of the left knee shows total knee arthroplasty with  no evidence of loosening of the femoral or tibial components.    PMFS History: Patient Active Problem List   Diagnosis Date Noted  . Trochanteric bursitis, left hip 08/10/2017  . Osteoarthritis of right hip 07/22/2015  . 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 Acute blood loss anemia 01/04/2013  . Osteoarthritis of left knee 01/01/2013    Class: Chronic   Past Medical History:  Diagnosis Date  . Arthritis    "left knee" (01/01/2013)  . Diabetes mellitus without complication (HCC)    borderline no med  . DVT of lower extremity (deep venous thrombosis) (HCC) 2007   "LLE; from birth control pill" (01/01/2013)  . GERD (gastroesophageal reflux disease)   . Headache    pt. states migraines at times  .  Hypercholesteremia    Crestor  . Hypertension     Family History  Problem Relation Age of Onset  . Breast cancer Neg Hx     Past Surgical History:  Procedure Laterality Date  . ANTERIOR CERVICAL DECOMP/DISCECTOMY FUSION  2006  . CARPAL TUNNEL RELEASE  2000   "right" (01/01/2013)  . CESAREAN SECTION  1978  . COLONOSCOPY    . ESOPHAGOGASTRODUODENOSCOPY    . KIDNEY DONATION  1994  . KNEE ARTHROPLASTY  01/01/2013   Procedure: COMPUTER ASSISTED TOTAL KNEE ARTHROPLASTY;  Surgeon: Kerrin ChampagneJames E Nitka, MD;  Location: [facility] OR;  Service: Orthopedics;  Laterality: Left;  Left computer assisted total knee replacement  . LIPOMA EXCISION  2000   back  . REPLACEMENT TOTAL KNEE  01/01/2013   "left" (01/01/2013)  . TOTAL HIP ARTHROPLASTY Right 07/22/2015   Procedure: RIGHT TOTAL HIP ARTHROPLASTY ANTERIOR APPROACH;  Surgeon: Kathryne Hitchhristopher Y Shone Leventhal, MD;  [facility] OR;  Service: Orthopedics;  Laterality: Right;   Social History   Occupational History  . Not on file  Tobacco Use  . Smoking status: Never Smoker  . Smokeless tobacco: Never Used  Substance and Sexual Activity  . Alcohol use: No  . Drug use: No  . Sexual activity: Yes

## 2018-09-01 NOTE — Progress Notes (Signed)
Please place orders in Epic as patient is being scheduled for a pre-op appointment! Thank you! 

## 2018-09-05 ENCOUNTER — Other Ambulatory Visit (INDEPENDENT_AMBULATORY_CARE_PROVIDER_SITE_OTHER): Payer: Self-pay | Admitting: Physician Assistant

## 2018-09-07 NOTE — Patient Instructions (Addendum)
Diana Bowers  09/07/2018   Your procedure is scheduled on: 09-15-18   Report to [facility] Main  Entrance    Report to Admitting at 10:15 AM    Call this number if you have problems the morning of surgery [phone]     Remember: Do not eat food or drink liquids :After Midnight.     Take these medicines the morning of surgery with A SIP OF WATER: None                                You may not have any metal on your body including hair pins and              piercings  Do not wear jewelry, make-up, lotions, powders or perfumes, deodorant             Do not wear nail polish.  Do not shave  48 hours prior to surgery.                Do not bring valuables to the hospital. Diana Bowers IS NOT             RESPONSIBLE   FOR VALUABLES.  Contacts, dentures or bridgework may not be worn into surgery.  Leave suitcase in the car. After surgery it may be brought to your room.     Special Instructions: N/A              Please read over the following fact sheets you were given: _____________________________________________________________________             [facility] - Preparing for Surgery Before surgery, you can play an important role.  Because skin is not sterile, your skin needs to be as free of germs as possible.  You can reduce the number of germs on your skin by washing with CHG (chlorahexidine gluconate) soap before surgery.  CHG is an antiseptic cleaner which kills germs and bonds with the skin to continue killing germs even after washing. Please DO NOT use if you have an allergy to CHG or antibacterial soaps.  If your skin becomes reddened/irritated stop using the CHG and inform your nurse when you arrive at Short Stay. Do not shave (including legs and underarms) for at least 48 hours prior to the first CHG shower.  You may shave your face/neck. Please follow these instructions carefully:  1.  Shower with CHG Soap the night before surgery and the  morning  of Surgery.  2.  If you choose to wash your hair, wash your hair first as usual with your  normal  shampoo.  3.  After you shampoo, rinse your hair and body thoroughly to remove the  shampoo.                           4.  Use CHG as you would any other liquid soap.  You can apply chg directly  to the skin and wash                       Gently with a scrungie or clean washcloth.  5.  Apply the CHG Soap to your body ONLY FROM THE NECK DOWN.   Do not use on face/ open  Wound or open sores. Avoid contact with eyes, ears mouth and genitals (private parts).                       Wash face,  Genitals (private parts) with your normal soap.             6.  Wash thoroughly, paying special attention to the area where your surgery  will be performed.  7.  Thoroughly rinse your body with warm water from the neck down.  8.  DO NOT shower/wash with your normal soap after using and rinsing off  the CHG Soap.                9.  Pat yourself dry with a clean towel.            10.  Wear clean pajamas.            11.  Place clean sheets on your bed the night of your first shower and do not  sleep with pets. Day of Surgery : Do not apply any lotions/deodorants the morning of surgery.  Please wear clean clothes to the hospital/surgery center.  FAILURE TO FOLLOW THESE INSTRUCTIONS MAY RESULT IN THE CANCELLATION OF YOUR SURGERY PATIENT SIGNATURE_________________________________  NURSE SIGNATURE__________________________________  ________________________________________________________________________   Diana Bowers  An incentive spirometer is a tool that can help keep your lungs clear and active. This tool measures how well you are filling your lungs with each breath. Taking long deep breaths may help reverse or decrease the chance of developing breathing (pulmonary) problems (especially infection) following:  A long period of time when you are unable to move or be  active. BEFORE THE PROCEDURE   If the spirometer includes an indicator to show your best effort, your nurse or respiratory therapist will set it to a desired goal.  If possible, sit up straight or lean slightly forward. Try not to slouch.  Hold the incentive spirometer in an upright position. INSTRUCTIONS FOR USE  1. Sit on the edge of your bed if possible, or sit up as far as you can in bed or on a chair. 2. Hold the incentive spirometer in an upright position. 3. Breathe out normally. 4. Place the mouthpiece in your mouth and seal your lips tightly around it. 5. Breathe in slowly and as deeply as possible, raising the piston or the ball toward the top of the column. 6. Hold your breath for 3-5 seconds or for as long as possible. Allow the piston or ball to fall to the bottom of the column. 7. Remove the mouthpiece from your mouth and breathe out normally. 8. Rest for a few seconds and repeat Steps 1 through 7 at least 10 times every 1-2 hours when you are awake. Take your time and take a few normal breaths between deep breaths. 9. The spirometer may include an indicator to show your best effort. Use the indicator as a goal to work toward during each repetition. 10. After each set of 10 deep breaths, practice coughing to be sure your lungs are clear. If you have an incision (the cut made at the time of surgery), support your incision when coughing by placing a pillow or rolled up towels firmly against it. Once you are able to get out of bed, walk around indoors and cough well. You may stop using the incentive spirometer when instructed by your caregiver.  RISKS AND COMPLICATIONS  Take your time so you do not get  dizzy or light-headed.  If you are in pain, you may need to take or ask for pain medication before doing incentive spirometry. It is harder to take a deep breath if you are having pain. AFTER USE  Rest and breathe slowly and easily.  It can be helpful to keep track of a log of  your progress. Your caregiver can provide you with a simple table to help with this. If you are using the spirometer at home, follow these instructions: Diana Bowers IF:   You are having difficultly using the spirometer.  You have trouble using the spirometer as often as instructed.  Your pain medication is not giving enough relief while using the spirometer.  You develop fever of 100.5 F (38.1 C) or higher. SEEK IMMEDIATE MEDICAL CARE IF:   You cough up bloody sputum that had not been present before.  You develop fever of 102 F (38.9 C) or greater.  You develop worsening pain at or near the incision site. MAKE SURE YOU:   Understand these instructions.  Will watch your condition.  Will get help right away if you are not doing well or get worse. Document Released: 04/25/2007 Document Revised: 03/06/2012 Document Reviewed: 06/26/2007 [facility] Patient Information 2014 [place], [place].   ________________________________________________________________________

## 2018-09-08 ENCOUNTER — Other Ambulatory Visit: Payer: Self-pay

## 2018-09-08 ENCOUNTER — Encounter (HOSPITAL_COMMUNITY)
Admission: RE | Admit: 2018-09-08 | Discharge: 2018-09-08 | Disposition: A | Discharge status: Home / Self Care | Payer: Medicare Other | Source: Ambulatory Visit | Attending: Orthopaedic Surgery | Admitting: Orthopaedic Surgery

## 2018-09-08 ENCOUNTER — Encounter (HOSPITAL_COMMUNITY): Payer: Self-pay

## 2018-09-08 DIAGNOSIS — Z01818 Encounter for other preprocedural examination: Secondary | ICD-10-CM | POA: Insufficient documentation

## 2018-09-08 LAB — SURGICAL PCR SCREEN
MRSA, PCR: NEGATIVE
Staphylococcus aureus: NEGATIVE

## 2018-09-08 LAB — HEMOGLOBIN A1C
HEMOGLOBIN A1C: 6 % — AB (ref 4.8–5.6)
Mean Plasma Glucose: 125.5 mg/dL

## 2018-09-08 LAB — BASIC METABOLIC PANEL
Anion gap: 9 (ref 5–15)
BUN: 21 mg/dL (ref 8–23)
CO2: 28 mmol/L (ref 22–32)
CREATININE: 0.97 mg/dL (ref 0.44–1.00)
Calcium: 9.9 mg/dL (ref 8.9–10.3)
Chloride: 107 mmol/L (ref 98–111)
GFR calc non Af Amer: 60 mL/min — ABNORMAL LOW (ref >60)
Glucose, Bld: 110 mg/dL — ABNORMAL HIGH (ref 70–99)
Potassium: 4.2 mmol/L (ref 3.5–5.1)
SODIUM: 144 mmol/L (ref 135–145)

## 2018-09-08 LAB — CBC
HCT: 38.8 % (ref 36.0–46.0)
Hemoglobin: 12.1 g/dL (ref 12.0–15.0)
MCH: 26.1 pg (ref 26.0–34.0)
MCHC: 31.2 g/dL (ref 30.0–36.0)
MCV: 83.6 fL (ref 78.0–100.0)
PLATELETS: 208 10*3/uL (ref 150–400)
RBC: 4.64 MIL/uL (ref 3.87–5.11)
RDW: 14 % (ref 11.5–15.5)
WBC: 7.6 10*3/uL (ref 4.0–10.5)

## 2018-09-14 NOTE — Progress Notes (Signed)
Pt made aware that her surgery time has changed from 12:45 pm to 1:15 pm. Pt is to arrive to Admitting at 10:45 am. Now that the surgery time has change, pt can now  have a Clear Liquid Diet from Midnight until 7:15 AM. Items that constitutes a Clear Liquid Diet was explained to the patient. Pt verbalized understanding of the surgery time and diet.

## 2018-09-15 ENCOUNTER — Inpatient Hospital Stay (HOSPITAL_COMMUNITY): Payer: Medicare Other | Admitting: Anesthesiology

## 2018-09-15 ENCOUNTER — Inpatient Hospital Stay (HOSPITAL_COMMUNITY): Payer: Medicare Other

## 2018-09-15 ENCOUNTER — Encounter (HOSPITAL_COMMUNITY): Payer: Self-pay

## 2018-09-15 ENCOUNTER — Other Ambulatory Visit: Payer: Self-pay

## 2018-09-15 ENCOUNTER — Encounter (HOSPITAL_COMMUNITY)
Admission: RE | Disposition: A | Discharge status: Home Health | Payer: Self-pay | Source: Home / Self Care | Attending: Orthopaedic Surgery

## 2018-09-15 ENCOUNTER — Inpatient Hospital Stay (HOSPITAL_COMMUNITY)
Admission: RE | Admit: 2018-09-15 | Discharge: 2018-09-18 | DRG: 465 | Disposition: A | Discharge status: Home Health | Payer: Medicare Other | Attending: Orthopaedic Surgery | Admitting: Orthopaedic Surgery

## 2018-09-15 DIAGNOSIS — E119 Type 2 diabetes mellitus without complications: Secondary | ICD-10-CM | POA: Diagnosis present

## 2018-09-15 DIAGNOSIS — Z6838 Body mass index (BMI) 38.0-38.9, adult: Secondary | ICD-10-CM | POA: Diagnosis not present

## 2018-09-15 DIAGNOSIS — E669 Obesity, unspecified: Secondary | ICD-10-CM | POA: Diagnosis present

## 2018-09-15 DIAGNOSIS — Z96652 Presence of left artificial knee joint: Secondary | ICD-10-CM | POA: Diagnosis not present

## 2018-09-15 DIAGNOSIS — Z86718 Personal history of other venous thrombosis and embolism: Secondary | ICD-10-CM | POA: Diagnosis not present

## 2018-09-15 DIAGNOSIS — Z880 Allergy status to penicillin: Secondary | ICD-10-CM | POA: Diagnosis not present

## 2018-09-15 DIAGNOSIS — Z96641 Presence of right artificial hip joint: Secondary | ICD-10-CM | POA: Diagnosis present

## 2018-09-15 DIAGNOSIS — Z7982 Long term (current) use of aspirin: Secondary | ICD-10-CM

## 2018-09-15 DIAGNOSIS — E7800 Pure hypercholesterolemia, unspecified: Secondary | ICD-10-CM | POA: Diagnosis present

## 2018-09-15 DIAGNOSIS — T84063A Wear of articular bearing surface of internal prosthetic left knee joint, initial encounter: Secondary | ICD-10-CM

## 2018-09-15 DIAGNOSIS — M7062 Trochanteric bursitis, left hip: Secondary | ICD-10-CM | POA: Diagnosis present

## 2018-09-15 DIAGNOSIS — G4730 Sleep apnea, unspecified: Secondary | ICD-10-CM | POA: Diagnosis present

## 2018-09-15 DIAGNOSIS — T84023A Instability of internal left knee prosthesis, initial encounter: Secondary | ICD-10-CM | POA: Diagnosis present

## 2018-09-15 DIAGNOSIS — K219 Gastro-esophageal reflux disease without esophagitis: Secondary | ICD-10-CM | POA: Diagnosis present

## 2018-09-15 DIAGNOSIS — Y838 Other surgical procedures as the cause of abnormal reaction of the patient, or of later complication, without mention of misadventure at the time of the procedure: Secondary | ICD-10-CM | POA: Diagnosis present

## 2018-09-15 DIAGNOSIS — I10 Essential (primary) hypertension: Secondary | ICD-10-CM | POA: Diagnosis present

## 2018-09-15 HISTORY — PX: I & D KNEE WITH POLY EXCHANGE: SHX5024

## 2018-09-15 LAB — GLUCOSE, CAPILLARY
GLUCOSE-CAPILLARY: 169 mg/dL — AB (ref 70–99)
Glucose-Capillary: 89 mg/dL (ref 70–99)

## 2018-09-15 SURGERY — IRRIGATION AND DEBRIDEMENT KNEE WITH POLY EXCHANGE
Anesthesia: Regional | Site: Knee | Laterality: Left

## 2018-09-15 MED ORDER — CLINDAMYCIN PHOSPHATE 900 MG/50ML IV SOLN
900.0000 mg | INTRAVENOUS | Status: AC
  Administered 2018-09-15: 900 mg via INTRAVENOUS
  Filled 2018-09-15: qty 50

## 2018-09-15 MED ORDER — FENTANYL CITRATE (PF) 100 MCG/2ML IJ SOLN: 50.0000 ug | INTRAMUSCULAR | Status: DC

## 2018-09-15 MED ORDER — SODIUM CHLORIDE 0.9 % IR SOLN
Status: DC | PRN
  Administered 2018-09-15 (×2): 1000 mL

## 2018-09-15 MED ORDER — PROPOFOL 10 MG/ML IV BOLUS
INTRAVENOUS | Status: DC | PRN
  Administered 2018-09-15: 200 mg via INTRAVENOUS

## 2018-09-15 MED ORDER — ROSUVASTATIN CALCIUM 10 MG PO TABS
10.0000 mg | ORAL_TABLET | Freq: Every day | ORAL | Status: DC
  Administered 2018-09-15 – 2018-09-17 (×3): 10 mg via ORAL
  Filled 2018-09-15 (×3): qty 1

## 2018-09-15 MED ORDER — CHLORHEXIDINE GLUCONATE 4 % EX LIQD: 60.0000 mL | Freq: Once | CUTANEOUS | Status: DC

## 2018-09-15 MED ORDER — MENTHOL 3 MG MT LOZG: 1.0000 | LOZENGE | OROMUCOSAL | Status: DC | PRN

## 2018-09-15 MED ORDER — MORPHINE SULFATE (PF) 2 MG/ML IV SOLN: 0.5000 mg | INTRAVENOUS | Status: DC | PRN

## 2018-09-15 MED ORDER — DILTIAZEM HCL ER COATED BEADS 180 MG PO CP24
360.0000 mg | ORAL_CAPSULE | Freq: Every day | ORAL | Status: DC
  Administered 2018-09-15 – 2018-09-17 (×3): 360 mg via ORAL
  Filled 2018-09-15 (×3): qty 2

## 2018-09-15 MED ORDER — LACTATED RINGERS IV SOLN
INTRAVENOUS | Status: DC
  Administered 2018-09-15 (×2): via INTRAVENOUS

## 2018-09-15 MED ORDER — ACETAMINOPHEN 325 MG PO TABS: 325.0000 mg | ORAL_TABLET | Freq: Four times a day (QID) | ORAL | Status: DC | PRN

## 2018-09-15 MED ORDER — METHOCARBAMOL 500 MG IVPB - SIMPLE MED
500.0000 mg | Freq: Four times a day (QID) | INTRAVENOUS | Status: DC | PRN
  Filled 2018-09-15: qty 50

## 2018-09-15 MED ORDER — ONDANSETRON HCL 4 MG/2ML IJ SOLN: 4.0000 mg | Freq: Four times a day (QID) | INTRAMUSCULAR | Status: DC | PRN

## 2018-09-15 MED ORDER — HYDROCODONE-ACETAMINOPHEN 5-325 MG PO TABS
1.0000 | ORAL_TABLET | ORAL | Status: DC | PRN
  Administered 2018-09-15: 1 via ORAL
  Administered 2018-09-16: 2 via ORAL
  Administered 2018-09-16: 1 via ORAL
  Administered 2018-09-17: 2 via ORAL
  Filled 2018-09-15 (×2): qty 1
  Filled 2018-09-15 (×2): qty 2

## 2018-09-15 MED ORDER — DEXAMETHASONE SODIUM PHOSPHATE 10 MG/ML IJ SOLN
INTRAMUSCULAR | Status: AC
  Filled 2018-09-15: qty 1

## 2018-09-15 MED ORDER — VITAMIN D 1000 UNITS PO TABS
1000.0000 [IU] | ORAL_TABLET | Freq: Every day | ORAL | Status: DC
  Administered 2018-09-15 – 2018-09-17 (×3): 1000 [IU] via ORAL
  Filled 2018-09-15 (×3): qty 1

## 2018-09-15 MED ORDER — SODIUM CHLORIDE 0.9 % IV SOLN
INTRAVENOUS | Status: DC
  Administered 2018-09-15: 75 mL/h via INTRAVENOUS
  Administered 2018-09-16: 06:00:00 via INTRAVENOUS

## 2018-09-15 MED ORDER — LIDOCAINE 2% (20 MG/ML) 5 ML SYRINGE
INTRAMUSCULAR | Status: DC | PRN
  Administered 2018-09-15: 100 mg via INTRAVENOUS

## 2018-09-15 MED ORDER — ONDANSETRON HCL 4 MG/2ML IJ SOLN: 4.0000 mg | Freq: Once | INTRAMUSCULAR | Status: DC | PRN

## 2018-09-15 MED ORDER — METHYLPREDNISOLONE ACETATE 40 MG/ML IJ SUSP
INTRAMUSCULAR | Status: DC | PRN
  Administered 2018-09-15: 40 mg

## 2018-09-15 MED ORDER — KETAMINE HCL 10 MG/ML IJ SOLN
INTRAMUSCULAR | Status: AC
  Filled 2018-09-15: qty 1

## 2018-09-15 MED ORDER — HYDROMORPHONE HCL 1 MG/ML IJ SOLN
INTRAMUSCULAR | Status: DC | PRN
  Administered 2018-09-15 (×5): .4 mg via INTRAVENOUS

## 2018-09-15 MED ORDER — MAGNESIUM OXIDE 400 (241.3 MG) MG PO TABS
200.0000 mg | ORAL_TABLET | Freq: Every day | ORAL | Status: DC
  Administered 2018-09-15 – 2018-09-18 (×4): 200 mg via ORAL
  Filled 2018-09-15 (×4): qty 1

## 2018-09-15 MED ORDER — ONDANSETRON HCL 4 MG PO TABS: 4.0000 mg | ORAL_TABLET | Freq: Four times a day (QID) | ORAL | Status: DC | PRN

## 2018-09-15 MED ORDER — METHYLPREDNISOLONE ACETATE 40 MG/ML IJ SUSP
INTRAMUSCULAR | Status: AC
  Filled 2018-09-15: qty 1

## 2018-09-15 MED ORDER — DIPHENHYDRAMINE HCL 12.5 MG/5ML PO ELIX: 12.5000 mg | ORAL_SOLUTION | ORAL | Status: DC | PRN

## 2018-09-15 MED ORDER — MIDAZOLAM HCL 2 MG/2ML IJ SOLN
1.0000 mg | INTRAMUSCULAR | Status: DC
  Administered 2018-09-15: 1 mg via INTRAVENOUS

## 2018-09-15 MED ORDER — FENTANYL CITRATE (PF) 100 MCG/2ML IJ SOLN
INTRAMUSCULAR | Status: AC
  Filled 2018-09-15: qty 2

## 2018-09-15 MED ORDER — 0.9 % SODIUM CHLORIDE (POUR BTL) OPTIME
TOPICAL | Status: DC | PRN
  Administered 2018-09-15: 1000 mL

## 2018-09-15 MED ORDER — FENTANYL CITRATE (PF) 100 MCG/2ML IJ SOLN
INTRAMUSCULAR | Status: DC | PRN
  Administered 2018-09-15 (×7): 50 ug via INTRAVENOUS

## 2018-09-15 MED ORDER — EPHEDRINE SULFATE-NACL 50-0.9 MG/10ML-% IV SOSY
PREFILLED_SYRINGE | INTRAVENOUS | Status: DC | PRN
  Administered 2018-09-15 (×3): 5 mg via INTRAVENOUS

## 2018-09-15 MED ORDER — KETOROLAC TROMETHAMINE 30 MG/ML IJ SOLN
INTRAMUSCULAR | Status: AC
  Filled 2018-09-15: qty 1

## 2018-09-15 MED ORDER — DOCUSATE SODIUM 100 MG PO CAPS
100.0000 mg | ORAL_CAPSULE | Freq: Two times a day (BID) | ORAL | Status: DC
  Administered 2018-09-15 – 2018-09-18 (×6): 100 mg via ORAL
  Filled 2018-09-15 (×6): qty 1

## 2018-09-15 MED ORDER — MIDAZOLAM HCL 2 MG/2ML IJ SOLN
INTRAMUSCULAR | Status: AC
  Administered 2018-09-15: 1 mg via INTRAVENOUS
  Filled 2018-09-15: qty 2

## 2018-09-15 MED ORDER — PROPOFOL 10 MG/ML IV BOLUS
INTRAVENOUS | Status: AC
  Filled 2018-09-15: qty 60

## 2018-09-15 MED ORDER — FENTANYL CITRATE (PF) 100 MCG/2ML IJ SOLN: 25.0000 ug | INTRAMUSCULAR | Status: DC | PRN

## 2018-09-15 MED ORDER — POLYETHYLENE GLYCOL 3350 17 G PO PACK: 17.0000 g | PACK | Freq: Every day | ORAL | Status: DC | PRN

## 2018-09-15 MED ORDER — ONDANSETRON HCL 4 MG/2ML IJ SOLN
INTRAMUSCULAR | Status: AC
  Filled 2018-09-15: qty 2

## 2018-09-15 MED ORDER — ALUM & MAG HYDROXIDE-SIMETH 200-200-20 MG/5ML PO SUSP: 30.0000 mL | ORAL | Status: DC | PRN

## 2018-09-15 MED ORDER — RIVAROXABAN 10 MG PO TABS
10.0000 mg | ORAL_TABLET | Freq: Every day | ORAL | Status: DC
  Administered 2018-09-16 – 2018-09-18 (×3): 10 mg via ORAL
  Filled 2018-09-15 (×3): qty 1

## 2018-09-15 MED ORDER — HYDROCODONE-ACETAMINOPHEN 7.5-325 MG PO TABS: 1.0000 | ORAL_TABLET | ORAL | Status: DC | PRN

## 2018-09-15 MED ORDER — BUPIVACAINE-EPINEPHRINE (PF) 0.25% -1:200000 IJ SOLN
INTRAMUSCULAR | Status: AC
  Filled 2018-09-15: qty 30

## 2018-09-15 MED ORDER — KETAMINE HCL 10 MG/ML IJ SOLN
INTRAMUSCULAR | Status: DC | PRN
  Administered 2018-09-15 (×2): 10 mg via INTRAVENOUS
  Administered 2018-09-15: 50 mg via INTRAVENOUS
  Administered 2018-09-15 (×2): 10 mg via INTRAVENOUS

## 2018-09-15 MED ORDER — METOCLOPRAMIDE HCL 5 MG PO TABS: 5.0000 mg | ORAL_TABLET | Freq: Three times a day (TID) | ORAL | Status: DC | PRN

## 2018-09-15 MED ORDER — FENTANYL CITRATE (PF) 250 MCG/5ML IJ SOLN
INTRAMUSCULAR | Status: AC
  Filled 2018-09-15: qty 5

## 2018-09-15 MED ORDER — TRAMADOL HCL 50 MG PO TABS
50.0000 mg | ORAL_TABLET | Freq: Four times a day (QID) | ORAL | Status: DC
  Administered 2018-09-15 – 2018-09-18 (×12): 50 mg via ORAL
  Filled 2018-09-15 (×12): qty 1

## 2018-09-15 MED ORDER — PANTOPRAZOLE SODIUM 40 MG PO TBEC
40.0000 mg | DELAYED_RELEASE_TABLET | Freq: Every day | ORAL | Status: DC
  Administered 2018-09-15 – 2018-09-17 (×3): 40 mg via ORAL
  Filled 2018-09-15 (×3): qty 1

## 2018-09-15 MED ORDER — ADULT MULTIVITAMIN W/MINERALS CH
1.0000 | ORAL_TABLET | Freq: Every day | ORAL | Status: DC
  Administered 2018-09-15 – 2018-09-18 (×4): 1 via ORAL
  Filled 2018-09-15 (×4): qty 1

## 2018-09-15 MED ORDER — METHOCARBAMOL 500 MG PO TABS: 500.0000 mg | ORAL_TABLET | Freq: Four times a day (QID) | ORAL | Status: DC | PRN

## 2018-09-15 MED ORDER — EPHEDRINE 5 MG/ML INJ
INTRAVENOUS | Status: AC
  Filled 2018-09-15: qty 10

## 2018-09-15 MED ORDER — ONDANSETRON HCL 4 MG/2ML IJ SOLN
INTRAMUSCULAR | Status: DC | PRN
  Administered 2018-09-15: 4 mg via INTRAVENOUS

## 2018-09-15 MED ORDER — METOCLOPRAMIDE HCL 5 MG/ML IJ SOLN: 5.0000 mg | Freq: Three times a day (TID) | INTRAMUSCULAR | Status: DC | PRN

## 2018-09-15 MED ORDER — HYDROMORPHONE HCL 2 MG/ML IJ SOLN
INTRAMUSCULAR | Status: AC
  Filled 2018-09-15: qty 1

## 2018-09-15 MED ORDER — LIDOCAINE HCL 1 % IJ SOLN
INTRAMUSCULAR | Status: DC | PRN
  Administered 2018-09-15: 3 mL

## 2018-09-15 MED ORDER — CLINDAMYCIN PHOSPHATE 600 MG/50ML IV SOLN
600.0000 mg | Freq: Four times a day (QID) | INTRAVENOUS | Status: AC
  Administered 2018-09-15 – 2018-09-16 (×2): 600 mg via INTRAVENOUS
  Filled 2018-09-15 (×2): qty 50

## 2018-09-15 MED ORDER — DEXAMETHASONE SODIUM PHOSPHATE 10 MG/ML IJ SOLN
INTRAMUSCULAR | Status: DC | PRN
  Administered 2018-09-15: 10 mg via INTRAVENOUS

## 2018-09-15 MED ORDER — SODIUM CHLORIDE 0.9 % IJ SOLN
INTRAMUSCULAR | Status: AC
  Filled 2018-09-15: qty 10

## 2018-09-15 MED ORDER — PHENOL 1.4 % MT LIQD: 1.0000 | OROMUCOSAL | Status: DC | PRN

## 2018-09-15 MED ORDER — LIDOCAINE HCL (PF) 1 % IJ SOLN
INTRAMUSCULAR | Status: AC
  Filled 2018-09-15: qty 30

## 2018-09-15 MED ORDER — ROPIVACAINE HCL 5 MG/ML IJ SOLN
INTRAMUSCULAR | Status: DC | PRN
  Administered 2018-09-15: 30 mL via PERINEURAL

## 2018-09-15 SURGICAL SUPPLY — 68 items
AGENT HMST KT MTR STRL THRMB (HEMOSTASIS)
BAG SPEC THK2 15X12 ZIP CLS (MISCELLANEOUS) ×1
BAG ZIPLOCK 12X15 (MISCELLANEOUS) ×3 IMPLANT
BANDAGE ACE 4X5 VEL STRL LF (GAUZE/BANDAGES/DRESSINGS) ×3 IMPLANT
BANDAGE ACE 6X5 VEL STRL LF (GAUZE/BANDAGES/DRESSINGS) ×5 IMPLANT
BANDAGE ESMARK 6X9 LF (GAUZE/BANDAGES/DRESSINGS) ×1 IMPLANT
BIT DRILL 2.8X128 (BIT) ×1 IMPLANT
BIT DRILL 2.8X128MM (BIT) ×1
BLADE SAG 18X100X1.27 (BLADE) ×2 IMPLANT
BNDG CMPR 9X6 STRL LF SNTH (GAUZE/BANDAGES/DRESSINGS) ×1
BNDG ESMARK 6X9 LF (GAUZE/BANDAGES/DRESSINGS) ×3
BOWL SMART MIX CTS (DISPOSABLE) ×2 IMPLANT
CEMENT HV SMART SET (Cement) ×2 IMPLANT
CLOSURE WOUND 1/2 X4 (GAUZE/BANDAGES/DRESSINGS)
COVER SURGICAL LIGHT HANDLE (MISCELLANEOUS) ×3 IMPLANT
CUFF TOURN SGL QUICK 34 (TOURNIQUET CUFF) ×3
CUFF TRNQT CYL 34X4X40X1 (TOURNIQUET CUFF) ×1 IMPLANT
DECANTER SPIKE VIAL GLASS SM (MISCELLANEOUS) ×1 IMPLANT
DRAPE EXTREMITY T 121X128X90 (DRAPE) ×3 IMPLANT
DRAPE POUCH INSTRU U-SHP 10X18 (DRAPES) ×3 IMPLANT
DRAPE U-SHAPE 47X51 STRL (DRAPES) ×3 IMPLANT
DRSG ADAPTIC 3X8 NADH LF (GAUZE/BANDAGES/DRESSINGS) ×3 IMPLANT
DRSG PAD ABDOMINAL 8X10 ST (GAUZE/BANDAGES/DRESSINGS) ×4 IMPLANT
ELECT REM PT RETURN 15FT ADLT (MISCELLANEOUS) ×3 IMPLANT
EVACUATOR 1/8 PVC DRAIN (DRAIN) ×1 IMPLANT
EVACUATOR SILICONE 100CC (DRAIN) ×1 IMPLANT
FACESHIELD WRAPAROUND (MASK) ×12 IMPLANT
FACESHIELD WRAPAROUND OR TEAM (MASK) ×5 IMPLANT
GAUZE SPONGE 4X4 12PLY STRL (GAUZE/BANDAGES/DRESSINGS) ×4 IMPLANT
GAUZE XEROFORM 1X8 LF (GAUZE/BANDAGES/DRESSINGS) ×2 IMPLANT
GLOVE BIOGEL PI IND STRL 8 (GLOVE) ×2 IMPLANT
GLOVE BIOGEL PI INDICATOR 8 (GLOVE) ×4
GLOVE ECLIPSE 8.0 STRL XLNG CF (GLOVE) ×3 IMPLANT
GLOVE ORTHO TXT STRL SZ7.5 (GLOVE) ×5 IMPLANT
GOWN STRL REUS W/TWL XL LVL3 (GOWN DISPOSABLE) ×5 IMPLANT
HANDPIECE INTERPULSE COAX TIP (DISPOSABLE) ×3
IMMOBILIZER KNEE 20 (SOFTGOODS)
IMMOBILIZER KNEE 20 THIGH 36 (SOFTGOODS) IMPLANT
KIT BASIN OR (CUSTOM PROCEDURE TRAY) ×3 IMPLANT
NDL SAFETY ECLIPSE 18X1.5 (NEEDLE) ×1 IMPLANT
NEEDLE HYPO 18GX1.5 SHARP (NEEDLE) ×3
NS IRRIG 1000ML POUR BTL (IV SOLUTION) ×6 IMPLANT
PACK TOTAL JOINT (CUSTOM PROCEDURE TRAY) ×3 IMPLANT
PADDING CAST COTTON 6X4 STRL (CAST SUPPLIES) ×6 IMPLANT
PATELLA DOME PFC 35MM (Knees) ×2 IMPLANT
PLATE ROT INSERT 12.5MM SIZE 4 (Plate) ×2 IMPLANT
POSITIONER SURGICAL ARM (MISCELLANEOUS) ×1 IMPLANT
SET HNDPC FAN SPRY TIP SCT (DISPOSABLE) ×1 IMPLANT
SET PAD KNEE POSITIONER (MISCELLANEOUS) ×2 IMPLANT
SPONGE LAP 18X18 RF (DISPOSABLE) ×3 IMPLANT
STAPLER VISISTAT 35W (STAPLE) ×2 IMPLANT
STRIP CLOSURE SKIN 1/2X4 (GAUZE/BANDAGES/DRESSINGS) ×2 IMPLANT
SUCTION FRAZIER HANDLE 12FR (TUBING) ×2
SUCTION TUBE FRAZIER 12FR DISP (TUBING) ×1 IMPLANT
SURGIFLO W/THROMBIN 8M KIT (HEMOSTASIS) ×1 IMPLANT
SUT MNCRL AB 4-0 PS2 18 (SUTURE) ×1 IMPLANT
SUT VIC AB 0 CT1 27 (SUTURE) ×6
SUT VIC AB 0 CT1 27XBRD ANTBC (SUTURE) IMPLANT
SUT VIC AB 1 CT1 36 (SUTURE) ×9 IMPLANT
SUT VIC AB 2-0 CT1 27 (SUTURE) ×9
SUT VIC AB 2-0 CT1 TAPERPNT 27 (SUTURE) ×3 IMPLANT
SWAB COLLECTION DEVICE MRSA (MISCELLANEOUS) ×1 IMPLANT
SWAB CULTURE ESWAB REG 1ML (MISCELLANEOUS) ×1 IMPLANT
SYR 50ML LL SCALE MARK (SYRINGE) ×1 IMPLANT
SYR CONTROL 10ML LL (SYRINGE) ×3 IMPLANT
TOWEL OR 17X26 10 PK STRL BLUE (TOWEL DISPOSABLE) ×7 IMPLANT
TRAY FOLEY MTR SLVR 14FR STAT (SET/KITS/TRAYS/PACK) ×2 IMPLANT
WATER STERILE IRR 1000ML POUR (IV SOLUTION) ×3 IMPLANT

## 2018-09-15 NOTE — Transfer of Care (Signed)
Immediate Anesthesia Transfer of Care Note  Patient: Diana Bowers  Procedure(s) Performed: POLY EXCHANGE LEFT KNEE,/,PATELLAR COMPONENT REVISION  STEROID INJECTION LEFT HIP (Left Knee)  Patient Location: PACU  Anesthesia Type:General  Level of Consciousness: awake  Airway & Oxygen Therapy: Patient Spontanous Breathing and Patient connected to face mask oxygen  Post-op Assessment: Report given to RN and Post -op Vital signs reviewed and stable  Post vital signs: Reviewed and stable  Last Vitals:  Vitals Value Taken Time  BP 156/84 09/15/2018  3:30 PM  Temp    Pulse 89 09/15/2018  3:31 PM  Resp 15 09/15/2018  3:31 PM  SpO2 100 % 09/15/2018  3:31 PM  Vitals shown include unvalidated device data.  Last Pain:  Vitals:   09/15/18 1235  TempSrc:   PainSc: 0-No pain         Complications: No apparent anesthesia complications

## 2018-09-15 NOTE — Discharge Instructions (Addendum)
Information on my medicine - XARELTO® (Rivaroxaban) ° °This medication education was reviewed with me or my healthcare representative as part of my discharge preparation.   °Why was Xarelto® prescribed for you? °Xarelto® was prescribed for you to reduce the risk of blood clots forming after orthopedic surgery. The medical term for these abnormal blood clots is venous thromboembolism (VTE). ° °What do you need to know about xarelto® ? °Take your Xarelto® ONCE DAILY at the same time every day. °You may take it either with or without food. ° °If you have difficulty swallowing the tablet whole, you may crush it and mix in applesauce just prior to taking your dose. ° °Take Xarelto® exactly as prescribed by your doctor and DO NOT stop taking Xarelto® without talking to the doctor who prescribed the medication.  Stopping without other VTE prevention medication to take the place of Xarelto® may increase your risk of developing a clot. ° °After discharge, you should have regular check-up appointments with your healthcare provider that is prescribing your Xarelto®.   ° °What do you do if you miss a dose? °If you miss a dose, take it as soon as you remember on the same day then continue your regularly scheduled once daily regimen the next day. Do not take two doses of Xarelto® on the same day.  ° °Important Safety Information °A possible side effect of Xarelto® is bleeding. You should call your healthcare provider right away if you experience any of the following: °? Bleeding from an injury or your nose that does not stop. °? Unusual colored urine (red or dark brown) or unusual colored stools (red or black). °? Unusual bruising for unknown reasons. °? A serious fall or if you hit your head (even if there is no bleeding). ° °Some medicines may interact with Xarelto® and might increase your risk of bleeding while on Xarelto®. To help avoid this, consult your healthcare provider or pharmacist prior to using any new prescription  or non-prescription medications, including herbals, vitamins, non-steroidal anti-inflammatory drugs (NSAIDs) and supplements. ° °This website has more information on Xarelto®: www.xarelto.com. ° °INSTRUCTIONS AFTER JOINT REPLACEMENT  ° °o Remove items at home which could result in a fall. This includes throw rugs or furniture in walking pathways °o ICE to the affected joint every three hours while awake for 30 minutes at a time, for at least the first 3-5 days, and then as needed for pain and swelling.  Continue to use ice for pain and swelling. You may notice swelling that will progress down to the foot and ankle.  This is normal after surgery.  Elevate your leg when you are not up walking on it.   °o Continue to use the breathing machine you got in the hospital (incentive spirometer) which will help keep your temperature down.  It is common for your temperature to cycle up and down following surgery, especially at night when you are not up moving around and exerting yourself.  The breathing machine keeps your lungs expanded and your temperature down. ° ° °DIET:  As you were doing prior to hospitalization, we recommend a well-balanced diet. ° °DRESSING / WOUND CARE / SHOWERING ° °Keep the surgical dressing until follow up.  The dressing is water proof, so you can shower without any extra covering.  IF THE DRESSING FALLS OFF or the wound gets wet inside, change the dressing with sterile gauze.  Please use good hand washing techniques before changing the dressing.  Do not use any lotions   or creams on the incision until instructed by your surgeon.   ° °ACTIVITY ° °o Increase activity slowly as tolerated, but follow the weight bearing instructions below.   °o No driving for 6 weeks or until further direction given by your physician.  You cannot drive while taking narcotics.  °o No lifting or carrying greater than 10 lbs. until further directed by your surgeon. °o Avoid periods of inactivity such as sitting longer than  an hour when not asleep. This helps prevent blood clots.  °o You may return to work once you are authorized by your doctor.  ° ° ° °WEIGHT BEARING  ° °Weight bearing as tolerated with assist device (walker, cane, etc) as directed, use it as long as suggested by your surgeon or therapist, typically at least 4-6 weeks. ° ° °EXERCISES ° °Results after joint replacement surgery are often greatly improved when you follow the exercise, range of motion and muscle strengthening exercises prescribed by your doctor. Safety measures are also important to protect the joint from further injury. Any time any of these exercises cause you to have increased pain or swelling, decrease what you are doing until you are comfortable again and then slowly increase them. If you have problems or questions, call your caregiver or physical therapist for advice.  ° °Rehabilitation is important following a joint replacement. After just a few days of immobilization, the muscles of the leg can become weakened and shrink (atrophy).  These exercises are designed to build up the tone and strength of the thigh and leg muscles and to improve motion. Often times heat used for twenty to thirty minutes before working out will loosen up your tissues and help with improving the range of motion but do not use heat for the first two weeks following surgery (sometimes heat can increase post-operative swelling).  ° °These exercises can be done on a training (exercise) mat, on the floor, on a table or on a bed. Use whatever works the best and is most comfortable for you.    Use music or television while you are exercising so that the exercises are a pleasant break in your day. This will make your life better with the exercises acting as a break in your routine that you can look forward to.   Perform all exercises about fifteen times, three times per day or as directed.  You should exercise both the operative leg and the other leg as well. ° °Exercises  include: °  °• Quad Sets - Tighten up the muscle on the front of the thigh (Quad) and hold for 5-10 seconds.   °• Straight Leg Raises - With your knee straight (if you were given a brace, keep it on), lift the leg to 60 degrees, hold for 3 seconds, and slowly lower the leg.  Perform this exercise against resistance later as your leg gets stronger.  °• Leg Slides: Lying on your back, slowly slide your foot toward your buttocks, bending your knee up off the floor (only go as far as is comfortable). Then slowly slide your foot back down until your leg is flat on the floor again.  °• Angel Wings: Lying on your back spread your legs to the side as far apart as you can without causing discomfort.  °• Hamstring Strength:  Lying on your back, push your heel against the floor with your leg straight by tightening up the muscles of your buttocks.  Repeat, but this time bend your knee to a comfortable angle,   and push your heel against the floor.  You may put a pillow under the heel to make it more comfortable if necessary.  ° °A rehabilitation program following joint replacement surgery can speed recovery and prevent re-injury in the future due to weakened muscles. Contact your doctor or a physical therapist for more information on knee rehabilitation.  ° ° °CONSTIPATION ° °Constipation is defined medically as fewer than three stools per week and severe constipation as less than one stool per week.  Even if you have a regular bowel pattern at home, your normal regimen is likely to be disrupted due to multiple reasons following surgery.  Combination of anesthesia, postoperative narcotics, change in appetite and fluid intake all can affect your bowels.  ° °YOU MUST use at least one of the following options; they are listed in order of increasing strength to get the job done.  They are all available over the counter, and you may need to use some, POSSIBLY even all of these options:   ° °Drink plenty of fluids (prune juice may be  helpful) and high fiber foods °Colace 100 mg by mouth twice a day  °Senokot for constipation as directed and as needed Dulcolax (bisacodyl), take with full glass of water  °Miralax (polyethylene glycol) once or twice a day as needed. ° °If you have tried all these things and are unable to have a bowel movement in the first 3-4 days after surgery call either your surgeon or your primary doctor.   ° °If you experience loose stools or diarrhea, hold the medications until you stool forms back up.  If your symptoms do not get better within 1 week or if they get worse, check with your doctor.  If you experience "the worst abdominal pain ever" or develop nausea or vomiting, please contact the office immediately for further recommendations for treatment. ° ° °ITCHING:  If you experience itching with your medications, try taking only a single pain pill, or even half a pain pill at a time.  You can also use Benadryl over the counter for itching or also to help with sleep.  ° °TED HOSE STOCKINGS:  Use stockings on both legs until for at least 2 weeks or as directed by physician office. They may be removed at night for sleeping. ° °MEDICATIONS:  See your medication summary on the “After Visit Summary” that nursing will review with you.  You may have some home medications which will be placed on hold until you complete the course of blood thinner medication.  It is important for you to complete the blood thinner medication as prescribed. ° °PRECAUTIONS:  If you experience chest pain or shortness of breath - call 911 immediately for transfer to the hospital emergency department.  ° °If you develop a fever greater that 101 F, purulent drainage from wound, increased redness or drainage from wound, foul odor from the wound/dressing, or calf pain - CONTACT YOUR SURGEON.   °                                                °FOLLOW-UP APPOINTMENTS:  If you do not already have a post-op appointment, please call the office for an  appointment to be seen by your surgeon.  Guidelines for how soon to be seen are listed in your “After Visit Summary”, but are typically between 1-4   weeks after surgery. ° °OTHER INSTRUCTIONS:  ° °Knee Replacement:  Do not place pillow under knee, focus on keeping the knee straight while resting. CPM instructions: 0-90 degrees, 2 hours in the morning, 2 hours in the afternoon, and 2 hours in the evening. Place foam block, curve side up under heel at all times except when in CPM or when walking.  DO NOT modify, tear, cut, or change the foam block in any way. ° °MAKE SURE YOU:  °• Understand these instructions.  °• Get help right away if you are not doing well or get worse.  ° ° °Thank you for letting us be a part of your medical care team.  It is a privilege we respect greatly.  We hope these instructions will help you stay on track for a fast and full recovery!  ° °

## 2018-09-15 NOTE — Anesthesia Preprocedure Evaluation (Addendum)
Anesthesia Evaluation  Patient identified by MRN, date of birth, ID band Patient awake    Reviewed: Allergy & Precautions, NPO status , Patient's Chart, lab work & pertinent test results  Airway Mallampati: III  TM Distance: >3 FB Neck ROM: Full    Dental no notable dental hx.    Pulmonary sleep apnea and Continuous Positive Airway Pressure Ventilation ,    Pulmonary exam normal breath sounds clear to auscultation       Cardiovascular hypertension, Pt. on medications + DVT  Normal cardiovascular exam Rhythm:Regular Rate:Normal  ECG: NSR, rate 63   Neuro/Psych  Headaches, negative psych ROS   GI/Hepatic Neg liver ROS, GERD  Medicated and Controlled,  Endo/Other  diabetes  Renal/GU negative Renal ROS     Musculoskeletal  (+) Arthritis ,   Abdominal (+) + obese,   Peds  Hematology HLD   Anesthesia Other Findings poly liner wear left knee left hip trochanteric bursitis  Reproductive/Obstetrics                          Anesthesia Physical Anesthesia Plan  ASA: III  Anesthesia Plan: General and Regional   Post-op Pain Management:    Induction: Intravenous  PONV Risk Score and Plan: 3 and Ondansetron, Dexamethasone, Midazolam and Treatment may vary due to age or medical condition  Airway Management Planned: LMA  Additional Equipment:   Intra-op Plan:   Post-operative Plan: Extubation in OR  Informed Consent: I have reviewed the patients History and Physical, chart, labs and discussed the procedure including the risks, benefits and alternatives for the proposed anesthesia with the patient or authorized representative who has indicated his/her understanding and acceptance.   Dental advisory given  Plan Discussed with: CRNA  Anesthesia Plan Comments:       Anesthesia Quick Evaluation

## 2018-09-15 NOTE — H&P (Signed)
TOTAL KNEE REVISION ADMISSION H&P  Patient is being admitted for left revision total knee arthroplasty.  Subjective:  Chief Complaint:left knee pain.  HPI: Diana Bowers, 67 y.o. female, has a history of pain and functional disability in the left knee(s) due to poly liner wear with knee instability and patient has failed non-surgical conservative treatments for greater than 12 weeks to include NSAID's and/or analgesics, flexibility and strengthening excercises, supervised PT with diminished ADL's post treatment, use of assistive devices and activity modification. The indications for the revision of the total knee arthroplasty are bearing surface wear leading to symptomatic synovitis and tibiofemoral instability. Onset of symptoms was gradual starting 2 years ago with gradually worsening course since that time.  Prior procedures on the left knee(s) include arthroplasty.  Patient currently rates pain in the left knee(s) at 8 out of 10 with activity. There is worsening of pain with activity and weight bearing, pain that interferes with activities of daily living, pain with passive range of motion and joint instability. This condition presents safety issues increasing the risk of falls.  There is no current active infection.  Patient Active Problem List   Diagnosis Date Noted  . 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 Trochanteric bursitis, left hip 08/10/2017  . Osteoarthritis of right hip 07/22/2015  . 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 Acute blood loss anemia 01/04/2013  . Osteoarthritis of left knee 01/01/2013    Class: Chronic   Past Medical History:  Diagnosis Date  . Arthritis    "left knee" (01/01/2013)  . Diabetes mellitus without complication (HCC)    borderline no med  . DVT of lower extremity (deep venous thrombosis) (HCC) 2007   "LLE; from birth control pill" (01/01/2013)  . GERD (gastroesophageal reflux disease)   . Headache    pt.  states migraines at times  . Hypercholesteremia    Crestor  . Hypertension     Past Surgical History:  Procedure Laterality Date  . ANTERIOR CERVICAL DECOMP/DISCECTOMY FUSION  2006  . CARPAL TUNNEL RELEASE  2000   "right" (01/01/2013)  . CESAREAN SECTION  1978  . COLONOSCOPY    . ESOPHAGOGASTRODUODENOSCOPY    . KIDNEY DONATION  1994  . KNEE ARTHROPLASTY  01/01/2013   Procedure: COMPUTER ASSISTED TOTAL KNEE ARTHROPLASTY;  Surgeon: Kerrin ChampagneJames E Nitka, MD;  Location: [facility] OR;  Service: Orthopedics;  Laterality: Left;  Left computer assisted total knee replacement  . LIPOMA EXCISION  2000   back  . REPLACEMENT TOTAL KNEE  01/01/2013   "left" (01/01/2013)  . TOTAL HIP ARTHROPLASTY Right 07/22/2015   Procedure: RIGHT TOTAL HIP ARTHROPLASTY ANTERIOR APPROACH;  Surgeon: Kathryne Hitchhristopher Y Jakota Manthei, MD;  [facility];  Service: Orthopedics;  Laterality: Right;    No current facility-administered medications for this encounter.    Current Outpatient Medications  Medication Sig Dispense Refill Last Dose  . aspirin EC 81 MG tablet Take 81 mg by mouth at bedtime.    Taking  . CALCIUM PO Take 1 tablet by mouth at bedtime.    Taking  . cholecalciferol (VITAMIN D) 1000 UNITS tablet Take 1,000 Units by mouth at bedtime.   Taking  . CRESTOR 10 MG tablet Take 10 mg by mouth at bedtime.  2 Taking  . diltiazem (CARDIZEM CD) 360 MG 24 hr capsule Take 360 mg by mouth at bedtime.    Taking  . Magnesium 250 MG TABS Take 250 mg by mouth daily.     .Marland Kitchen  Multiple Vitamin (MULTIVITAMIN WITH MINERALS) TABS tablet Take 1 tablet by mouth daily.     . pantoprazole (PROTONIX) 40 MG tablet Take 40 mg by mouth at bedtime.  3   . traMADol (ULTRAM) 50 MG tablet Take 1-2 tablets (50-100 mg total) by mouth every 12 (twelve) hours as needed for moderate pain or severe pain. 60 tablet 0    Allergies  Allergen Reactions  . Penicillins Hives    Has patient had a PCN reaction causing immediate rash, facial/tongue/throat swelling, SOB or  lightheadedness with hypotension:Yes Has patient had a PCN reaction causing severe rash involving mucus membranes or skin necrosis: No Has patient had a PCN reaction that required hospitalization: No Has patient had a PCN reaction occurring within the last 10 years: Unknown If all of the above answers are "NO", then may proceed with Cephalosporin use.     Social History   Tobacco Use  . Smoking status: Never Smoker  . Smokeless tobacco: Never Used  Substance Use Topics  . Alcohol use: No    Family History  Problem Relation Age of Onset  . Breast cancer Neg Hx       Review of Systems  Musculoskeletal: Positive for joint pain.  All other systems reviewed and are negative.    Objective:  Physical Exam  Constitutional: She is oriented to person, place, and time. She appears well-developed and well-nourished.  HENT:  Head: Normocephalic and atraumatic.  Eyes: Pupils are equal, round, and reactive to light. EOM are normal.  Neck: Normal range of motion. Neck supple.  Cardiovascular: Normal rate and regular rhythm.  Respiratory: Breath sounds normal.  GI: Soft. Bowel sounds are normal.  Musculoskeletal:       Left knee: She exhibits swelling, LCL laxity and MCL laxity.  Neurological: She is alert and oriented to person, place, and time.  Skin: Skin is warm and dry.  Psychiatric: She has a normal mood and affect.    Vital signs in last 24 hours:    Labs:  Estimated body mass index is 38.43 kg/m as calculated from the following:   Height as of 09/08/18: 5' 3.5" (1.613 m).   Weight as of 09/08/18: 100 kg.  Imaging Review Plain radiographs demonstrate no evidence of implant failure or lossening  Preoperative templating of the joint replacement has been completed, documented, and submitted to the Operating Room personnel in order to optimize intra-operative equipment management.   Assessment/Plan:  Poly-liner wear left knee with joint instability on clinical exam and  recurrent synovitis.  The patient history, physical examination, clinical judgment of the provider and imaging studies are consistent with the need to upsize her poly liner of her left total knee.  The risks and benefits have been discussed in detail and informed consent is obtained.  She does wish to have a left hip steroid injection in her trochanteric area due to bursitis and tendonitis.  This can be done today as well.

## 2018-09-15 NOTE — Anesthesia Postprocedure Evaluation (Signed)
Anesthesia Post Note  Patient: Diana Bowers  Procedure(s) Performed: POLY EXCHANGE LEFT KNEE,/,PATELLAR COMPONENT REVISION  STEROID INJECTION LEFT HIP (Left Knee)     Patient location during evaluation: PACU Anesthesia Type: Regional and General Level of consciousness: awake and alert Pain management: pain level controlled Vital Signs Assessment: post-procedure vital signs reviewed and stable Respiratory status: spontaneous breathing, nonlabored ventilation, respiratory function stable and patient connected to nasal cannula oxygen Cardiovascular status: blood pressure returned to baseline and stable Postop Assessment: no apparent nausea or vomiting Anesthetic complications: no    Last Vitals:  Vitals:   09/15/18 1620 09/15/18 1735  BP: (!) 151/84 (!) 147/80  Pulse: 75 75  Resp: 14   Temp: 36.4 C 37.1 C  SpO2: 94% 98%    Last Pain:  Vitals:   09/15/18 1735  TempSrc: Oral  PainSc:                  Catheryn Baconyan P Ellender

## 2018-09-15 NOTE — Anesthesia Procedure Notes (Signed)
Anesthesia Regional Block: Adductor canal block   Pre-Anesthetic Checklist: ,, timeout performed, Correct Patient, Correct Site, Correct Laterality, Correct Procedure,, site marked, risks and benefits discussed, Surgical consent,  Pre-op evaluation,  At surgeon's request and post-op pain management  Laterality: Left  Prep: chloraprep       Needles:  Injection technique: Single-shot  Needle Type: Echogenic Stimulator Needle     Needle Length: 10cm  Needle Gauge: 21     Additional Needles:   Procedures:,,,, ultrasound used (permanent image in chart),,,,  Narrative:  Start time: 09/15/2018 12:20 PM End time: 09/15/2018 12:30 PM Injection made incrementally with aspirations every 5 mL.  Performed by: Personally  Anesthesiologist: Leonides GrillsEllender, Carmina Walle P, MD  Additional Notes: Functioning IV was confirmed and monitors were applied. A time-out was performed. Hand hygiene and sterile gloves were used. The thigh was placed in a frog-leg position and prepped in a sterile fashion. A 100mm 21ga Pajunk echogenic stimulator needle was placed using ultrasound guidance.  Negative aspiration and negative test dose prior to incremental administration of local anesthetic. The patient tolerated the procedure well.

## 2018-09-15 NOTE — Brief Op Note (Signed)
09/15/2018  2:50 PM  PATIENT:  Diana Bowers  67 y.o. female  PRE-OPERATIVE DIAGNOSIS:  poly liner wear left knee, left hip trochanteric bursitis  POST-OPERATIVE DIAGNOSIS:  poly liner wear left knee, left hip trochanteric bursitis  PROCEDURE:  Procedure(s): POLY EXCHANGE LEFT KNEE,/,PATELLAR COMPONENT REVISION  STEROID INJECTION LEFT HIP (Left)  SURGEON:  Surgeon(s) and Role:    Kathryne Hitch* Wataru Mccowen Y, MD - Primary  PHYSICIAN ASSISTANT: Rexene EdisonGil Clark, PA-C  ANESTHESIA:   regional and general  EBL:  75 mL   COUNTS:  YES  TOURNIQUET:   Total Tourniquet Time Documented: Thigh (Left) - 51 minutes Total: Thigh (Left) - 51 minutes   DICTATION: .Other Dictation: Dictation Number [id number]  PLAN OF CARE: Admit to inpatient   PATIENT DISPOSITION:  PACU - hemodynamically stable.   Delay start of Pharmacological VTE agent (>24hrs) due to surgical blood loss or risk of bleeding: no

## 2018-09-15 NOTE — Anesthesia Procedure Notes (Signed)
Procedure Name: LMA Insertion Date/Time: 09/15/2018 1:17 PM Performed by: Leonides GrillsEllender, Ryan P, MD Patient Re-evaluated:Patient Re-evaluated prior to induction Oxygen Delivery Method: Circle system utilized Preoxygenation: Pre-oxygenation with 100% oxygen Induction Type: IV induction Ventilation: Mask ventilation without difficulty LMA: LMA inserted LMA Size: 4.0 Number of attempts: 1 Placement Confirmation: positive ETCO2 and breath sounds checked- equal and bilateral Tube secured with: Tape Dental Injury: Teeth and Oropharynx as per pre-operative assessment

## 2018-09-15 NOTE — Progress Notes (Signed)
Assisted Dr. Ellender with left, ultrasound guided, adductor canal block. Side rails up, monitors on throughout procedure. See vital signs in flow sheet. Tolerated Procedure well.  

## 2018-09-15 NOTE — Op Note (Signed)
NAME: Diana Bowers, Diana Bowers MEDICAL RECORD ON:[id number] ACCOUNT [id number] DATE OF BIRTH:1951/03/15 FACILITY: [facility] LOCATION: [facility] PHYSICIAN:Luciel Brickman Aretha Parrot, MD  OPERATIVE REPORT  DATE OF PROCEDURE:  09/15/2018  PREOPERATIVE DIAGNOSES:   1.  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 2.  Left hip trochanteric bursitis.  POSTOPERATIVE DIAGNOSES:   1.  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 2.  Left hip trochanteric bursitis.  PROCEDURE: 1.  Left knee arthrotomy with removal of scar tissue and upsizing the polyethylene liner. 2.  Revision of patellar component, left total knee arthroplasty. 3.  Steroid injection, left hip trochanteric area.  IMPLANTS:  Upsizing a 10 mm thickness rotating polyethylene insert to a 12.5 mm insert.  Changing out the patellar button from  a size 35 with medializing the component.  SURGEON:  Vanita Panda. Magnus Ivan, MD  ASSISTANT:  Richardean Canal, PA-C.  ANESTHESIA: 1.  Left lower extremity adductor canal block. 2.  General.  TOURNIQUET TIME:  Under one hour.  ESTIMATED BLOOD LOSS:  Less  than 100 mL.  COMPLICATIONS:  None.  INDICATIONS:  The patient is a 67 year old female who one of our partners performed a total knee arthroplasty on about five to six years ago.  She is an obese individual and with time, she has developed some instability symptoms of her left knee with  problems with going up obliquely down stairs.  The patella clunks quite a bit and implant actually appears stable on x-ray with no evidence of loosening at all.  On clinical exam, she does have more play with anterior drawer sign as well as medial and  lateral stressing of the knee.  At this point, I do feel that the components look in good position and no evidence of loosening, but she does need upsizing of her poly from 10 to at least 12.5 or higher.  She is having some patellofemoral symptoms as  well, so at the time of  surgery, she understands we are going to assess the patellar component.  DESCRIPTION OF PROCEDURE:  After informed consent was obtained, appropriate left knee was marked.  She was brought to the operating room and placed on the operating table.  General anesthesia was then obtained.  Of note, an adductor canal block was  obtained in the holding room as well.  When she was put to sleep.  A nonsterile tourniquet was placed on her upper left thigh and her left thigh, knee, leg, ankle and foot were prepped and draped with DuraPrep and sterile drapes and a sterile  stockinette.  Time-out was called to identify correct patient, correct left knee.  We then used an Esmarch to wrap that leg and tourniquet was inflated to 300 mm of pressure.  I then made a direct midline incision through her previous incision, carried  this proximally and distally.  We dissected down the knee joint carried out and carried out a medial parapatellar arthrotomy.  I did not find any synovitis significantly in the knee and no large effusion at all.  There is no evidence of infection.   Again, the knee did feel unstable.  Once we removed scar tissue from around the patella.  We could see that the patellar component itself was a little bit too medialized.  There was definitely wear on the patella component.  We then made a decision to  revise to patellar component.  Using an oscillating saw, we removed the original patellar component and shaved a few millimeters of bone and  removed any cement debris and residual polyethylene.  We then removed her size 10 mm thickness polyethylene  insert for a size 4 knee.  We trialed up to a 12.5 thickness polyethylene insert and we definitely felt that she had much more stable knee with still full motion.  We then removed that poly insert and irrigated the knee with normal saline solution using  pulsatile lavage.  I removed just minimal scar tissue from the knee.  I then placed a real 12.5 rotating  platform polyethylene insert.  We then drilled 3 holes for a size 35 patellar button.  moving it to a more central to lateral position for tracking  purposes.  We then mixed our cement and cemented the size 35 patellar button.  I was pleased with the tracking after this.  We put her knee through flexion and extension.  We then irrigated the knee again with normal saline solution using pulsatile  lavage.  The tourniquet was let down.  Hemostasis obtained with electrocautery.  We closed the arthrotomy with interrupted #1 Vicryl suture, followed by 0 Vicryl in the deep tissue, 2-0 Vicryl in subcutaneous tissue and interrupted staples on the skin.   Xeroform well-padded sterile dressing was applied.    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  A Band-Aid was placed over this.  She was then awakened,  extubated, and taken to recovery room in stable condition.  All final counts were correct.  There were no complications noted.  Note, Rexene EdisonGil Clark, PA-C, assisted the entire case.  His assistance was crucial for facilitating all aspects of this case.  AN/NUANCE  D:09/15/2018 T:09/15/2018 JOB:[id number]

## 2018-09-16 LAB — BASIC METABOLIC PANEL
ANION GAP: 9 (ref 5–15)
BUN: 18 mg/dL (ref 8–23)
CHLORIDE: 107 mmol/L (ref 98–111)
CO2: 24 mmol/L (ref 22–32)
Calcium: 9.2 mg/dL (ref 8.9–10.3)
Creatinine, Ser: 0.91 mg/dL (ref 0.44–1.00)
GFR calc Af Amer: >60 mL/min (ref >60)
GLUCOSE: 145 mg/dL — AB (ref 70–99)
POTASSIUM: 5 mmol/L (ref 3.5–5.1)
Sodium: 140 mmol/L (ref 135–145)

## 2018-09-16 LAB — CBC
HCT: 36.7 % (ref 36.0–46.0)
HEMOGLOBIN: 11.7 g/dL — AB (ref 12.0–15.0)
MCH: 26.2 pg (ref 26.0–34.0)
MCHC: 31.9 g/dL (ref 30.0–36.0)
MCV: 82.1 fL (ref 78.0–100.0)
PLATELETS: 196 10*3/uL (ref 150–400)
RBC: 4.47 MIL/uL (ref 3.87–5.11)
RDW: 13.8 % (ref 11.5–15.5)
WBC: 9.2 10*3/uL (ref 4.0–10.5)

## 2018-09-16 LAB — GLUCOSE, CAPILLARY
GLUCOSE-CAPILLARY: 130 mg/dL — AB (ref 70–99)
GLUCOSE-CAPILLARY: 118 mg/dL — AB (ref 70–99)
Glucose-Capillary: 110 mg/dL — ABNORMAL HIGH (ref 70–99)
Glucose-Capillary: 101 mg/dL — ABNORMAL HIGH (ref 70–99)

## 2018-09-16 MED ORDER — HYDROCODONE-ACETAMINOPHEN 5-325 MG PO TABS: 1.0000 | ORAL_TABLET | ORAL | 0 refills | Status: DC | PRN

## 2018-09-16 MED ORDER — RIVAROXABAN 10 MG PO TABS: 10.0000 mg | ORAL_TABLET | Freq: Every day | ORAL | 0 refills | Status: DC

## 2018-09-16 NOTE — Progress Notes (Signed)
Subjective: 1 Day Post-Op Procedure(s) (LRB): POLY EXCHANGE LEFT KNEE,/,PATELLAR COMPONENT REVISION  STEROID INJECTION LEFT HIP (Left) Patient reports pain as moderate.  Did well with therapy this am.  Objective: Vital signs in last 24 hours: Temp:  [97.6 F (36.4 C)-98.8 F (37.1 C)] 98.6 F (37 C) (09/21 1000) Pulse Rate:  [55-93] 70 (09/21 1000) Resp:  [12-25] 18 (09/21 1000) BP: (108-178)/(61-97) 108/61 (09/21 1000) SpO2:  [94 %-100 %] 96 % (09/21 1000) Weight:  [100 kg] 100 kg (09/20 1620)  Intake/Output from previous day: 09/20 0701 - 09/21 0700 In: 2596.9 [P.O.:120; I.V.:2376.4; IV Piggyback:100.5] Out: 1525 [Urine:1450; Blood:75] Intake/Output this shift: No intake/output data recorded.  Recent Labs    09/16/18 0400  HGB 11.7*   Recent Labs    09/16/18 0400  WBC 9.2  RBC 4.47  HCT 36.7  PLT 196   Recent Labs    09/16/18 0400  NA 140  K 5.0  CL 107  CO2 24  BUN 18  CREATININE 0.91  GLUCOSE 145*  CALCIUM 9.2   No results for input(s): LABPT, INR in the last 72 hours.  Sensation intact distally Intact pulses distally Dorsiflexion/Plantar flexion intact Incision: dressing C/D/I No cellulitis present Compartment soft  Assessment/Plan: 1 Day Post-Op Procedure(s) (LRB): POLY EXCHANGE LEFT KNEE,/,PATELLAR COMPONENT REVISION  STEROID INJECTION LEFT HIP (Left) Up with therapy Discharge home with home health Monday.    Kathryne HitchChristopher Y Blackman 09/16/2018, 11:13 AM

## 2018-09-16 NOTE — Evaluation (Signed)
Physical Therapy Evaluation Patient Details Name: Diana Bowers R Staff MRN: [id number] DOB: 1951-02-25 Today's Date: 09/16/2018   History of Present Illness  67 yo female adm for L TKA revision; PMH: HTN, DM, R DA THA  Clinical Impression  Pt is s/p TKA resulting in the deficits listed below (see PT Problem List).   Pt doing very well today, amb 200' with RW and supervision/min-guard; initiated HEP; will continue to follow;    Pt will benefit from skilled PT to increase their independence and safety with mobility to allow discharge to the venue listed below.      Follow Up Recommendations Follow surgeon's recommendation for DC plan and follow-up therapies    Equipment Recommendations  None recommended by PT    Recommendations for Other Services       Precautions / Restrictions Precautions Precautions: Knee Required Braces or Orthoses: Knee Immobilizer - Left Restrictions Weight Bearing Restrictions: No Other Position/Activity Restrictions: WBAT      Mobility  Bed Mobility               General bed mobility comments: NT --pt in chair  Transfers Overall transfer level: Needs assistance Equipment used: Rolling walker (2 wheeled) Transfers: Sit to/from Stand Sit to Stand: Min guard         General transfer comment: cues for hand placement  Ambulation/Gait Ambulation/Gait assistance: Min guard Gait Distance (Feet): 200 Feet Assistive device: Rolling walker (2 wheeled) Gait Pattern/deviations: Step-through pattern;Decreased stride length     General Gait Details: cues for Rw position  Stairs            Wheelchair Mobility    Modified Rankin (Stroke Patients Only)       Balance                                             Pertinent Vitals/Pain Pain Assessment: 0-10 Pain Score: 5  Pain Location: L knee  Pain Descriptors / Indicators: Sore Pain Intervention(s): Limited activity within patient's tolerance;Monitored during  session;Premedicated before session;Repositioned    Home Living Family/patient expects to be discharged to:: Private residence Living Arrangements: Spouse/significant other Available Help at Discharge: Available 24 hours/day;Family Type of Home: House Home Access: Stairs to enter Entrance Stairs-Rails: None Entrance Stairs-Number of Steps: 2 Home Layout: One level Home Equipment: Environmental consultantWalker - 2 wheels;Cane - single point      Prior Function Level of Independence: Independent               Hand Dominance        Extremity/Trunk Assessment   Upper Extremity Assessment Upper Extremity Assessment: Overall WFL for tasks assessed    Lower Extremity Assessment Lower Extremity Assessment: LLE deficits/detail LLE Deficits / Details: ankle WFL; knee extension and hip flexion grossly 3/5, AAROM -10* to 50* knee flexion       Communication   Communication: No difficulties  Cognition Arousal/Alertness: Awake/alert Behavior During Therapy: WFL for tasks assessed/performed Overall Cognitive Status: Within Functional Limits for tasks assessed                                        General Comments      Exercises Total Joint Exercises Ankle Circles/Pumps: AROM;Both;10 reps Quad Sets: Both;AROM;10 reps   Assessment/Plan    PT Assessment  Patient needs continued PT services  PT Problem List Decreased strength;Decreased range of motion;Decreased activity tolerance;Decreased mobility;Decreased knowledge of use of DME;Pain       PT Treatment Interventions DME instruction;Gait training;Therapeutic exercise;Functional mobility training;Patient/family education;Therapeutic activities;Stair training    PT Goals (Current goals can be found in the Care Plan section)  Acute Rehab PT Goals PT Goal Formulation: With patient Time For Goal Achievement: 09/23/18 Potential to Achieve Goals: Good    Frequency 7X/week   Barriers to discharge        Co-evaluation                AM-PAC PT "6 Clicks" Daily Activity  Outcome Measure Difficulty turning over in bed (including adjusting bedclothes, sheets and blankets)?: A Little Difficulty moving from lying on back to sitting on the side of the bed? : A Little Difficulty sitting down on and standing up from a chair with arms (e.g., wheelchair, bedside commode, etc,.)?: A Little Help needed moving to and from a bed to chair (including a wheelchair)?: A Little Help needed walking in hospital room?: A Little Help needed climbing 3-5 steps with a railing? : A Little 6 Click Score: 18    End of Session Equipment Utilized During Treatment: Gait belt;Left knee immobilizer Activity Tolerance: Patient tolerated treatment well Patient left: with call bell/phone within reach;in chair   PT Visit Diagnosis: Difficulty in walking, not elsewhere classified (R26.2)    Time: 1610-9604 PT Time Calculation (min) (ACUTE ONLY): 21 min   Charges:   PT Evaluation $PT Eval Low Complexity: 1 Low          Drucilla Chalet, PT Pager: [phone] 09/16/2018   [facility] [phone]   [facility] 09/16/2018, 10:40 AM

## 2018-09-16 NOTE — Progress Notes (Signed)
OT Cancellation Note  Patient Details Name: Diana Bowers MRN: [id number] DOB: 03/23/1951   Cancelled Treatment:    Reason Eval/Treat Not Completed: OT screened, no needs identified, will sign off  Riya Huxford A Lilton Pare 09/16/2018, 12:23 PM

## 2018-09-16 NOTE — Plan of Care (Signed)
  Problem: Clinical Measurements: Goal: Will remain free from infection Outcome: Progressing   Problem: Elimination: Goal: Will not experience complications related to bowel motility Outcome: Progressing   Problem: Safety: Goal: Ability to remain free from injury will improve Outcome: Progressing   Problem: Education: Goal: Knowledge of the prescribed therapeutic regimen will improve Outcome: Progressing   Problem: Pain Management: Goal: Pain level will decrease with appropriate interventions Outcome: Progressing   Reeves ForthConcetta Katz, RN 09/16/18 12:43 AM

## 2018-09-16 NOTE — Care Management Note (Signed)
Case Management Note  Patient Details  Name: Diana Bowers MRN: [id number] Date of Birth: [birth year]  Subjective/Objective:    Left TKA                Action/Plan: [facility] to pt and [facility] Pt states she has a RW at home. [facility] Husband will be at home to assist with care. Declined 3n1 bedside commode. She has elevated commode seats at home.   Expected Discharge Date:                Expected Discharge Plan:  Home w Home Health Services  In-House Referral:  NA  Discharge planning Services  CM Consult  Post Acute Care Choice:  Home Health Choice offered to:  Patient  DME Arranged:  N/A DME Agency:  NA  HH Arranged:  PT HH Agency:  [facility]  Status of Service:  Completed, signed off  If discussed at [employer] Meetings, dates discussed:    Additional Comments:  Elliot CousinShavis, Cornie Herrington Ellen, RN 09/16/2018, 10:53 AM

## 2018-09-17 LAB — GLUCOSE, CAPILLARY
GLUCOSE-CAPILLARY: 78 mg/dL (ref 70–99)
Glucose-Capillary: 90 mg/dL (ref 70–99)
Glucose-Capillary: 98 mg/dL (ref 70–99)
Glucose-Capillary: 100 mg/dL — ABNORMAL HIGH (ref 70–99)

## 2018-09-17 NOTE — Progress Notes (Signed)
Physical Therapy Treatment Patient Details Name: Diana Bowers MRN: [id number] DOB: 1951/11/26 Today's Date: 09/17/2018    History of Present Illness 67 yo female adm for L TKA revision; PMH: HTN, DM, R DA THA    PT Comments    Pt is progressing well, c/o incr knee "stiffness" today which improved with mobility; continue PT plan of care with possible d/c tomorrow   Follow Up Recommendations  Follow surgeon's recommendation for DC plan and follow-up therapies     Equipment Recommendations  None recommended by PT    Recommendations for Other Services       Precautions / Restrictions Precautions Precautions: Knee Precaution Comments: IND SLRs Restrictions Weight Bearing Restrictions: No    Mobility  Bed Mobility Overal bed mobility: Needs Assistance Bed Mobility: Supine to Sit;Sit to Supine     Supine to sit: Min guard;Supervision Sit to supine: Supervision   General bed mobility comments: incr time, able to manage LLE without physical assist  Transfers Overall transfer level: Needs assistance Equipment used: Rolling walker (2 wheeled) Transfers: Sit to/from Stand Sit to Stand: Supervision         General transfer comment: cues for hand placement  Ambulation/Gait Ambulation/Gait assistance: Min guard;Supervision Gait Distance (Feet): 250 Feet(10' more) Assistive device: Rolling walker (2 wheeled) Gait Pattern/deviations: Step-through pattern;Decreased stride length     General Gait Details: cues for RW position, step length, gait progression; improved wt shift to LLE   Stairs             Wheelchair Mobility    Modified Rankin (Stroke Patients Only)       Balance                                            Cognition Arousal/Alertness: Awake/alert Behavior During Therapy: WFL for tasks assessed/performed Overall Cognitive Status: Within Functional Limits for tasks assessed                                         Exercises Total Joint Exercises Ankle Circles/Pumps: AROM;Both;10 reps Quad Sets: Both;AROM;10 reps Heel Slides: AROM;AAROM;Left;10 reps Hip ABduction/ADduction: AROM;AAROM;Left;10 reps Straight Leg Raises: AROM;Left;10 reps Goniometric ROM: ~ -10* to 55* AAROM L knee flexion    General Comments        Pertinent Vitals/Pain Pain Assessment: 0-10 Pain Score: 5  Pain Location: L knee  Pain Descriptors / Indicators: Sore;Burning Pain Intervention(s): Limited activity within patient's tolerance;Monitored during session;Premedicated before session;Repositioned;Ice applied    Home Living                      Prior Function            PT Goals (current goals can now be found in the care plan section) Acute Rehab PT Goals PT Goal Formulation: With patient Time For Goal Achievement: 09/23/18 Potential to Achieve Goals: Good Progress towards PT goals: Progressing toward goals    Frequency    7X/week      PT Plan Current plan remains appropriate    Co-evaluation              AM-PAC PT "6 Clicks" Daily Activity  Outcome Measure  Difficulty turning over in bed (including adjusting bedclothes, sheets and blankets)?: A Little Difficulty moving from  lying on back to sitting on the side of the bed? : A Little Difficulty sitting down on and standing up from a chair with arms (e.g., wheelchair, bedside commode, etc,.)?: A Little Help needed moving to and from a bed to chair (including a wheelchair)?: A Little Help needed walking in hospital room?: A Little Help needed climbing 3-5 steps with a railing? : A Little 6 Click Score: 18    End of Session Equipment Utilized During Treatment: Gait belt Activity Tolerance: Patient tolerated treatment well Patient left: with call bell/phone within reach;in bed   PT Visit Diagnosis: Difficulty in walking, not elsewhere classified (R26.2)     Time: 1610-9604 PT Time Calculation (min) (ACUTE ONLY): 34  min  Charges:  $Gait Training: 8-22 mins $Therapeutic Exercise: 8-22 mins                     Drucilla Chalet, PT Pager: [phone] 09/17/2018   [facility] [phone]    [facility] 09/17/2018, 1:53 PM

## 2018-09-17 NOTE — Progress Notes (Signed)
   09/17/18 1500  PT Visit Information  Last PT Received On 09/17/18  Pt continues to progress well, practiced stairs this session. Will continue PT POC for acute setting; pt will likely d/c after PT in am  Assistance Needed +1  History of Present Illness 67 yo female adm for L TKA revision; PMH: HTN, DM, R DA THA  Precautions  Precautions Knee  Precaution Comments IND SLRs, incr time to initiate but able to perform without assist, <10* lag  Restrictions  Weight Bearing Restrictions No  Pain Assessment  Pain Assessment 0-10  Pain Score 4  Pain Location L knee   Pain Descriptors / Indicators Sore;Burning  Pain Intervention(s) Limited activity within patient's tolerance;Monitored during session;Premedicated before session;Ice applied  Cognition  Arousal/Alertness Awake/alert  Behavior During Therapy WFL for tasks assessed/performed  Overall Cognitive Status Within Functional Limits for tasks assessed  Bed Mobility  Overal bed mobility Needs Assistance  Bed Mobility Supine to Sit;Sit to Supine  Supine to sit Supervision  Sit to supine Supervision  General bed mobility comments incr time, able to manage LLE without physical assist  Transfers  Overall transfer level Needs assistance  Equipment used Rolling walker (2 wheeled)  Transfers Sit to/from Stand  Sit to Stand Supervision  General transfer comment cues for hand placement  Ambulation/Gait  Ambulation/Gait assistance Min guard;Supervision  Gait Distance (Feet) 150 Feet (x2)  Assistive device Rolling walker (2 wheeled)  Gait Pattern/deviations Step-through pattern;Decreased stride length  General Gait Details cues for RW position, step length, gait progression; improved wt shift to LLE  Stairs Yes  Stairs assistance Min guard;Min assist  Stair Management No rails;Step to pattern;Backwards;With walker  Number of Stairs 3  General stair comments cues for technique, sequence and safety   Total Joint Exercises  Ankle  Circles/Pumps AROM;Both;10 reps  PT - End of Session  Equipment Utilized During Treatment Gait belt  Activity Tolerance Patient tolerated treatment well  Patient left with call bell/phone within reach;in bed   PT - Assessment/Plan  PT Plan Current plan remains appropriate  PT Visit Diagnosis Difficulty in walking, not elsewhere classified (R26.2)  PT Frequency (ACUTE ONLY) 7X/week  Follow Up Recommendations Follow surgeon's recommendation for DC plan and follow-up therapies  PT equipment None recommended by PT  AM-PAC PT "6 Clicks" Daily Activity Outcome Measure  Difficulty turning over in bed (including adjusting bedclothes, sheets and blankets)? 3  Difficulty moving from lying on back to sitting on the side of the bed?  3  Difficulty sitting down on and standing up from a chair with arms (e.g., wheelchair, bedside commode, etc,.)? 3  Help needed moving to and from a bed to chair (including a wheelchair)? 3  Help needed walking in hospital room? 3  Help needed climbing 3-5 steps with a railing?  3  6 Click Score 18  Mobility G Code  CK  Acute Rehab PT Goals  PT Goal Formulation With patient  Time For Goal Achievement 09/23/18  Potential to Achieve Goals Good  PT Time Calculation  PT Start Time (ACUTE ONLY) 1438  PT Stop Time (ACUTE ONLY) 1502  PT Time Calculation (min) (ACUTE ONLY) 24 min  PT General Charges  $$ ACUTE PT VISIT 1 Visit  PT Treatments  $Gait Training 23-37 mins

## 2018-09-17 NOTE — Progress Notes (Addendum)
Physical Therapy Treatment--late entry for 09/16/18 Patient Details Name: Diana Bowers MRN: [id number] DOB: Jun 27, 1951 Today's Date: 09/17/2018    History of Present Illness 67 yo female adm for L TKA revision; PMH: HTN, DM, R DA THA    PT Comments    Progressing very well  Follow Up Recommendations  Follow surgeon's recommendation for DC plan and follow-up therapies     Equipment Recommendations  None recommended by PT    Recommendations for Other Services       Precautions / Restrictions Precautions Precautions: Knee Precaution Comments: IND SLRs Restrictions Weight Bearing Restrictions: No    Mobility  Bed Mobility Overal bed mobility: Needs Assistance Bed Mobility: Sit to Supine       Sit to supine: Min guard   General bed mobility comments: with LLE  Transfers Overall transfer level: Needs assistance Equipment used: Rolling walker (2 wheeled) Transfers: Sit to/from Stand Sit to Stand: Min guard;Supervision         General transfer comment: cues for hand placement  Ambulation/Gait Ambulation/Gait assistance: Min Emergency planning/management officer (Feet): 250 Feet Assistive device: Rolling walker (2 wheeled) Gait Pattern/deviations: Step-through pattern;Decreased stride length     General Gait Details: cues for Rw position, step length, gait progression   Stairs             Wheelchair Mobility    Modified Rankin (Stroke Patients Only)       Balance                                            Cognition Arousal/Alertness: Awake/alert Behavior During Therapy: WFL for tasks assessed/performed Overall Cognitive Status: Within Functional Limits for tasks assessed                                        Exercises Total Joint Exercises Ankle Circles/Pumps: AROM;Both;10 reps Quad Sets: Both;AROM;10 reps Heel Slides: AROM;AAROM;Left;10 reps Hip ABduction/ADduction: AROM;AAROM;Left;10 reps Straight Leg  Raises: AROM;Left;10 reps    General Comments        Pertinent Vitals/Pain Pain Assessment: 0-10 Pain Score: 4  Pain Location: L knee  Pain Descriptors / Indicators: Sore Pain Intervention(s): Limited activity within patient's tolerance;Monitored during session;Repositioned    Home Living                      Prior Function            PT Goals (current goals can now be found in the care plan section) Acute Rehab PT Goals PT Goal Formulation: With patient Time For Goal Achievement: 09/23/18 Potential to Achieve Goals: Good Progress towards PT goals: Progressing toward goals    Frequency    7X/week      PT Plan Current plan remains appropriate    Co-evaluation              AM-PAC PT "6 Clicks" Daily Activity  Outcome Measure  Difficulty turning over in bed (including adjusting bedclothes, sheets and blankets)?: A Little Difficulty moving from lying on back to sitting on the side of the bed? : A Little Difficulty sitting down on and standing up from a chair with arms (e.g., wheelchair, bedside commode, etc,.)?: A Little Help needed moving to and from a bed to chair (including a wheelchair)?:  A Little Help needed walking in hospital room?: A Little Help needed climbing 3-5 steps with a railing? : A Little 6 Click Score: 18    End of Session Equipment Utilized During Treatment: Gait belt Activity Tolerance: Patient tolerated treatment well Patient left: with call bell/phone within reach;in chair   PT Visit Diagnosis: Difficulty in walking, not elsewhere classified (R26.2)     Time: 1610-96041422-1448 PT Time Calculation (min) (ACUTE ONLY): 26 min  Charges:                           [facility] CenterWILLIAMS,Aalyah Mansouri 09/17/2018, 9:28 AM

## 2018-09-17 NOTE — Progress Notes (Signed)
Late entry, charges for 09/16/18  09/16/18 2000  Restrictions  Weight Bearing Restrictions No  PT General Charges  $$ ACUTE PT VISIT 1 Visit  PT Treatments  $Gait Training 8-22 mins  $Therapeutic Exercise 8-22 mins

## 2018-09-17 NOTE — Progress Notes (Signed)
Subjective: 2 Days Post-Op Procedure(s) (LRB): POLY EXCHANGE LEFT KNEE,/,PATELLAR COMPONENT REVISION  STEROID INJECTION LEFT HIP (Left) Patient reports pain as mild.    Objective: Vital signs in last 24 hours: Temp:  [97.9 F (36.6 C)-98.5 F (36.9 C)] 98.5 F (36.9 C) (09/22 0520) Pulse Rate:  [64-74] 64 (09/22 0520) Resp:  [16] 16 (09/22 0520) BP: (129-136)/(70-73) 129/73 (09/22 0520) SpO2:  [96 %-100 %] 100 % (09/22 0520)  Intake/Output from previous day: 09/21 0701 - 09/22 0700 In: 1144.7 [P.O.:780; I.V.:364.7] Out: 300 [Urine:300] Intake/Output this shift: Total I/O In: 240 [P.O.:240] Out: -   Recent Labs    09/16/18 0400  HGB 11.7*   Recent Labs    09/16/18 0400  WBC 9.2  RBC 4.47  HCT 36.7  PLT 196   Recent Labs    09/16/18 0400  NA 140  K 5.0  CL 107  CO2 24  BUN 18  CREATININE 0.91  GLUCOSE 145*  CALCIUM 9.2   No results for input(s): LABPT, INR in the last 72 hours.  Sensation intact distally Intact pulses distally Dorsiflexion/Plantar flexion intact Incision: dressing C/D/I No cellulitis present Compartment soft  Assessment/Plan: 2 Days Post-Op Procedure(s) (LRB): POLY EXCHANGE LEFT KNEE,/,PATELLAR COMPONENT REVISION  STEROID INJECTION LEFT HIP (Left) Up with therapy Plan for discharge tomorrow Discharge home with home health    Kathryne HitchChristopher Y Dodger Bowers 09/17/2018, 10:56 AM

## 2018-09-18 ENCOUNTER — Other Ambulatory Visit (INDEPENDENT_AMBULATORY_CARE_PROVIDER_SITE_OTHER): Payer: Self-pay | Admitting: Orthopaedic Surgery

## 2018-09-18 ENCOUNTER — Encounter (HOSPITAL_COMMUNITY): Payer: Self-pay | Admitting: Orthopaedic Surgery

## 2018-09-18 LAB — GLUCOSE, CAPILLARY: Glucose-Capillary: 92 mg/dL (ref 70–99)

## 2018-09-18 MED ORDER — TRAMADOL HCL 50 MG PO TABS: 50.0000 mg | ORAL_TABLET | Freq: Four times a day (QID) | ORAL | 0 refills | Status: DC | PRN

## 2018-09-18 NOTE — Progress Notes (Signed)
Subjective: 3 Days Post-Op Procedure(s) (LRB): POLY EXCHANGE LEFT KNEE,/,PATELLAR COMPONENT REVISION  STEROID INJECTION LEFT HIP (Left) Patient reports pain as mild.    Objective: Vital signs in last 24 hours: Temp:  [98.7 F (37.1 C)-99.1 F (37.3 C)] 98.7 F (37.1 C) (09/23 0505) Pulse Rate:  [77-87] 87 (09/23 0505) Resp:  [14-18] 18 (09/23 0505) BP: (128-142)/(61-82) 140/82 (09/23 0505) SpO2:  [97 %-99 %] 99 % (09/23 0505)  Intake/Output from previous day: 09/22 0701 - 09/23 0700 In: 480 [P.O.:480] Out: -  Intake/Output this shift: No intake/output data recorded.  Recent Labs    09/16/18 0400  HGB 11.7*   Recent Labs    09/16/18 0400  WBC 9.2  RBC 4.47  HCT 36.7  PLT 196   Recent Labs    09/16/18 0400  NA 140  K 5.0  CL 107  CO2 24  BUN 18  CREATININE 0.91  GLUCOSE 145*  CALCIUM 9.2   No results for input(s): LABPT, INR in the last 72 hours.  Sensation intact distally Intact pulses distally Dorsiflexion/Plantar flexion intact Incision: scant drainage No cellulitis present Compartment soft   Assessment/Plan: 3 Days Post-Op Procedure(s) (LRB): POLY EXCHANGE LEFT KNEE,/,PATELLAR COMPONENT REVISION  STEROID INJECTION LEFT HIP (Left) Up with therapy Discharge home with home health today.    Kathryne HitchChristopher Y Elveta Rape 09/18/2018, 7:24 AM

## 2018-09-18 NOTE — Discharge Summary (Signed)
Patient ID: Diana DimitriFaye R Sienkiewicz MRN: [id number] DOB/AGE: [birth year] 65 y.o.  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 Arthritis    "left knee" (01/01/2013)  . Diabetes mellitus without complication (HCC)    borderline no med  . DVT of lower extremity (deep venous thrombosis) (HCC) 2007   "LLE; from birth control pill" (01/01/2013)  . GERD (gastroesophageal reflux disease)   . Headache    pt. states migraines at times  . Hypercholesteremia    Crestor  . Hypertension     Surgeries: Procedure(s): POLY EXCHANGE LEFT KNEE,/,PATELLAR COMPONENT REVISION  STEROID INJECTION LEFT HIP on 09/15/2018   Consultants:   Discharged Condition: Improved  Hospital Course: Diana Bowers is an 67 y.o. female who was admitted 09/15/2018 for operative treatment of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). Patient has severe unremitting pain that affects sleep, daily activities, and work/hobbies. After pre-op clearance the patient was taken to the operating room on 09/15/2018 and underwent  Procedure(s): POLY EXCHANGE LEFT KNEE,/,PATELLAR COMPONENT REVISION  STEROID INJECTION LEFT HIP.    Patient was given perioperative antibiotics:  Anti-infectives (From admission, onward)   Start     Dose/Rate Route Frequency Ordered Stop   09/15/18 2000  clindamycin (CLEOCIN) IVPB 600 mg     600 mg 100 mL/hr over 30 Minutes Intravenous Every 6 hours 09/15/18 1622 09/16/18 0229   09/15/18 1100  clindamycin (CLEOCIN) IVPB 900 mg     900 mg 100 mL/hr over 30 Minutes Intravenous On call to O.R. 09/15/18 1056 09/15/18 1338       Patient was given sequential compression devices, early ambulation, and chemoprophylaxis to prevent DVT.  Patient benefited maximally  from hospital stay and there were no complications.    Recent vital signs:  Patient Vitals for the past 24 hrs:  BP Temp Temp src Pulse Resp SpO2  09/18/18 0505 140/82 98.7 F (37.1 C) Oral 87 18 99 %  09/17/18 2151 (!) 142/78 99.1 F (37.3 C) Oral 80 16 97 %  09/17/18 1336 128/61 98.8 F (37.1 C) Oral 77 14 98 %     Recent laboratory studies:  Recent Labs    09/16/18 0400  WBC 9.2  HGB 11.7*  HCT 36.7  PLT 196  NA 140  K 5.0  CL 107  CO2 24  BUN 18  CREATININE 0.91  GLUCOSE 145*  CALCIUM 9.2     Discharge Medications:   Allergies as of 09/18/2018      Reactions   Penicillins Hives   Has patient had a PCN reaction causing immediate rash, facial/tongue/throat swelling, SOB or lightheadedness with hypotension:Yes Has patient had a PCN reaction causing severe rash involving mucus membranes or skin necrosis: No Has patient had a PCN reaction that required hospitalization: No Has patient had a PCN reaction occurring within the last 10 years: Unknown If all of the above answers are "NO", then may proceed with Cephalosporin use.      Medication List    TAKE these medications   aspirin EC 81 MG tablet Take 81 mg by mouth at bedtime.   CALCIUM PO Take 1 tablet by mouth at bedtime.   cholecalciferol 1000 units tablet Commonly known as:  VITAMIN D Take 1,000 Units by mouth at  bedtime.   CRESTOR 10 MG tablet Generic drug:  rosuvastatin Take 10 mg by mouth at bedtime.   diltiazem 360 MG 24 hr capsule Commonly known as:  CARDIZEM CD Take 360 mg by mouth at bedtime.   HYDROcodone-acetaminophen 5-325 MG tablet Commonly known as:  NORCO/VICODIN Take 1-2 tablets by mouth every 4 (four) hours as needed for moderate pain (pain score 4-6).   Magnesium 250 MG Tabs Take 250 mg by mouth daily.   multivitamin with minerals Tabs tablet Take 1 tablet by mouth daily.   pantoprazole 40 MG tablet Commonly known as:  PROTONIX Take 40 mg by mouth at bedtime.   rivaroxaban  10 MG Tabs tablet Commonly known as:  XARELTO Take 1 tablet (10 mg total) by mouth daily with breakfast.   traMADol 50 MG tablet Commonly known as:  ULTRAM Take 1-2 tablets (50-100 mg total) by mouth every 12 (twelve) hours as needed for moderate pain or severe pain.            Durable Medical Equipment  (From admission, onward)         Start     Ordered   09/15/18 1623  DME Walker rolling  Once    Question:  Patient needs a walker to treat with the following condition  Answer:  Status post revision of total replacement of left knee   09/15/18 1623   09/15/18 1623  DME 3 n 1  Once     09/15/18 1623          Diagnostic Studies: Dg Knee Left Port  Result Date: 09/15/2018 CLINICAL DATA:  Postop left knee following total left knee arthroplasty. EXAM: PORTABLE LEFT KNEE - 1-2 VIEW COMPARISON:  None. FINDINGS: The prosthetic components appear well seated and well aligned. There is no acute fracture or evidence of an operative complication. IMPRESSION: Well-positioned total left knee arthroplasty. Electronically Signed   By: Amie Portland M.D.   On: 09/15/2018 16:04   Xr Knee 1-2 Views Left  Result Date: 08/21/2018 2 views of the left knee shows total knee arthroplasty with no evidence of loosening of the femoral or tibial components.   Disposition: Discharge disposition: 01-Home or Self Care       Discharge Instructions    Discharge patient   Complete by:  As directed    Discharge disposition:  01-Home or Self Care   Discharge patient date:  09/18/2018      Follow-up Information    Kathryne Hitch, MD Follow up in 2 week(s).   Specialty:  Orthopedic Surgery Contact information: [address] [place] [address] [phone]            Signed: Kathryne Hitch 09/18/2018, 7:25 AM

## 2018-09-18 NOTE — Progress Notes (Signed)
Physical Therapy Treatment Patient Details Name: Diana Bowers MRN: [id number] DOB: 1951-06-30 Today's Date: 09/18/2018    History of Present Illness 67 yo female adm for L TKA revision; PMH: HTN, DM, R DA THA    PT Comments    POD #3 Pt dressed and eager to D/C.  Assisted with amb in hallway, practived stairs and performed all supine TE's.  Pt instructed on proper tech, freq as well as use of ICE.   Follow Up Recommendations  Follow surgeon's recommendation for DC plan and follow-up therapies     Equipment Recommendations  None recommended by PT    Recommendations for Other Services       Precautions / Restrictions Precautions Precautions: Knee Restrictions Weight Bearing Restrictions: No Other Position/Activity Restrictions: WBAT    Mobility  Bed Mobility               General bed mobility comments: OOB in recliner   Transfers Overall transfer level: Needs assistance Equipment used: Rolling walker (2 wheeled) Transfers: Sit to/from Stand Sit to Stand: Supervision            Ambulation/Gait Ambulation/Gait assistance: Supervision Gait Distance (Feet): 125 Feet Assistive device: Rolling walker (2 wheeled) Gait Pattern/deviations: Step-through pattern;Decreased stride length         Stairs Stairs: Yes Stairs assistance: Min assist Stair Management: No rails;Step to pattern;Backwards;With walker Number of Stairs: 2 General stair comments: < 25% VC's    Wheelchair Mobility    Modified Rankin (Stroke Patients Only)       Balance                                            Cognition Arousal/Alertness: Awake/alert Behavior During Therapy: WFL for tasks assessed/performed Overall Cognitive Status: Within Functional Limits for tasks assessed                                        Exercises   L LE  TE's 10 reps B LE ankle pumps 10 reps towel squeezes 10 reps knee presses 10 reps heel slides  10 reps  SAQ's 10 reps SLR's 10 reps ABD Followed by ICE    General Comments        Pertinent Vitals/Pain Pain Assessment: 0-10 Pain Score: 3  Pain Location: L knee  Pain Descriptors / Indicators: Sore;Burning Pain Intervention(s): Monitored during session;Repositioned;Ice applied    Home Living                      Prior Function            PT Goals (current goals can now be found in the care plan section) Progress towards PT goals: Progressing toward goals    Frequency    7X/week      PT Plan Current plan remains appropriate    Co-evaluation              AM-PAC PT "6 Clicks" Daily Activity  Outcome Measure  Difficulty turning over in bed (including adjusting bedclothes, sheets and blankets)?: A Little Difficulty moving from lying on back to sitting on the side of the bed? : A Little Difficulty sitting down on and standing up from a chair with arms (e.g., wheelchair, bedside commode, etc,.)?: A Little Help needed  moving to and from a bed to chair (including a wheelchair)?: A Little Help needed walking in hospital room?: A Little Help needed climbing 3-5 steps with a railing? : A Little 6 Click Score: 18    End of Session   Activity Tolerance: Patient tolerated treatment well Patient left: in chair;with call bell/phone within reach Nurse Communication: (pt ready for D/C to home) PT Visit Diagnosis: Difficulty in walking, not elsewhere classified (R26.2)     Time: 1610-96041045-1110 PT Time Calculation (min) (ACUTE ONLY): 25 min  Charges:  $Gait Training: 8-22 mins $Therapeutic Exercise: 8-22 mins                     Felecia ShellingLori Christinna Sprung  PTA Acute  Rehabilitation Services Pager      [phone] Office      [phone]

## 2018-09-19 ENCOUNTER — Telehealth (INDEPENDENT_AMBULATORY_CARE_PROVIDER_SITE_OTHER): Payer: Self-pay | Admitting: Orthopaedic Surgery

## 2018-09-19 NOTE — Telephone Encounter (Signed)
Flor, [facility]  3 week 1  2 week 1  CB # [phone]

## 2018-09-19 NOTE — Telephone Encounter (Signed)
IC verbal given.  

## 2018-09-21 ENCOUNTER — Inpatient Hospital Stay (INDEPENDENT_AMBULATORY_CARE_PROVIDER_SITE_OTHER): Payer: Medicare Other | Admitting: Orthopaedic Surgery

## 2018-09-21 ENCOUNTER — Telehealth (INDEPENDENT_AMBULATORY_CARE_PROVIDER_SITE_OTHER): Payer: Self-pay

## 2018-09-21 NOTE — Telephone Encounter (Signed)
Just take Xarelto and not aspirin.

## 2018-09-21 NOTE — Telephone Encounter (Signed)
Patient states the hospital told her to take xarelto AND aspirin?? I told her, no. Correct?

## 2018-09-21 NOTE — Telephone Encounter (Signed)
Patient aware.

## 2018-09-28 ENCOUNTER — Encounter (INDEPENDENT_AMBULATORY_CARE_PROVIDER_SITE_OTHER): Payer: Self-pay | Admitting: Orthopaedic Surgery

## 2018-09-28 ENCOUNTER — Ambulatory Visit (INDEPENDENT_AMBULATORY_CARE_PROVIDER_SITE_OTHER): Payer: Medicare Other | Admitting: Orthopaedic Surgery

## 2018-09-28 DIAGNOSIS — Z96652 Presence of left artificial knee joint: Secondary | ICD-10-CM

## 2018-09-28 DIAGNOSIS — T84063D Wear of articular bearing surface of internal prosthetic left knee joint, subsequent encounter: Secondary | ICD-10-CM

## 2018-09-28 NOTE — Progress Notes (Signed)
The patient is 2 weeks tomorrow status post a left knee revision.  Basically she had a exchange and upsizing of her polyethylene liner as well as a revision of the patella component due to wear.  She is also having instability of her knee and symptomatic synovitis.  We were able to increase the liner size in terms of thickness without adversely affecting her motion.  She did well in the hospital postoperatively.  This is her first visit as an outpatient.  Notes from therapy show that she is doing so well that they do not feel that she needs outpatient physical therapy.  They feel comfortable with her with her own home exercise program.  Her knee feels ligaments is stable in the left knee.  I reviewed all the staples and placed Steri-Strips.  Her calf is soft.  This point she can stop her blood thinning medication.  Like to see her back in 4 weeks to see how she is doing overall.  She is cleared to drive.  She is not taking pain medication.  All question concerns were answered and addressed.  In 4 weeks from now it is just for a routine follow-up visit with no x-rays needed.

## 2018-10-26 ENCOUNTER — Encounter (INDEPENDENT_AMBULATORY_CARE_PROVIDER_SITE_OTHER): Payer: Self-pay | Admitting: Orthopaedic Surgery

## 2018-10-26 ENCOUNTER — Ambulatory Visit (INDEPENDENT_AMBULATORY_CARE_PROVIDER_SITE_OTHER): Payer: Medicare Other | Admitting: Orthopaedic Surgery

## 2018-10-26 DIAGNOSIS — Z96652 Presence of left artificial knee joint: Secondary | ICD-10-CM

## 2018-10-26 NOTE — Progress Notes (Signed)
HPI: [name] returns today for follow-up of her left knee status post poly-exchange left knee and revision patella component due to wear.  She is overall trending towards improvement.  She states her range of motion strength improving.  She is happy thus far with the results.  She did have some staples that were left in her knee and states that she remove these on her own.  Physical exam: Left knee she has full extension flexion to 105 degrees.  Slight keloid formation calf is supple and nontender.  Plan: She will continue to work on range of motion strengthening of the knee particularly flexion.  Work on Social research officer, government.  Follow-up in 4 weeks check progress lack of.  Questions encouraged and answered at length

## 2018-11-22 ENCOUNTER — Encounter (INDEPENDENT_AMBULATORY_CARE_PROVIDER_SITE_OTHER): Payer: Self-pay | Admitting: Orthopaedic Surgery

## 2018-11-22 ENCOUNTER — Ambulatory Visit (INDEPENDENT_AMBULATORY_CARE_PROVIDER_SITE_OTHER): Payer: Medicare Other | Admitting: Orthopaedic Surgery

## 2018-11-22 DIAGNOSIS — Z96652 Presence of left artificial knee joint: Secondary | ICD-10-CM

## 2018-11-22 NOTE — Progress Notes (Signed)
HPI: Mrs. Diana Bowers returns now 68 days status post poly-exchange left knee and exchange of patella component.  She is overall doing very well.  She is back to doing water aerobics.  She is happy with the results of revision.  She has no concerns or complaints.  Review of systems: See HPI otherwise negative  Physical exam: Left knee full extension flexion to 110 degrees no instability valgus varus stressing.  Anterior drawer is negative.  Slight keloid formation otherwise the surgical incisions well-healed.  Calf supple nontender.  Impression: 68 days status post left knee revision with poly-exchange and patellar component revision.  Plan: She will continue to work on range of motion strengthening knee scar tissue mobilization.  We will see her back in a year postop at that time obtain AP and lateral views of the left knee.  Questions were encouraged and answered by Dr. Magnus IvanBlackman and myself.

## 2019-01-29 ENCOUNTER — Ambulatory Visit (INDEPENDENT_AMBULATORY_CARE_PROVIDER_SITE_OTHER): Payer: Medicare Other | Admitting: Orthopaedic Surgery

## 2019-02-05 ENCOUNTER — Ambulatory Visit (INDEPENDENT_AMBULATORY_CARE_PROVIDER_SITE_OTHER): Payer: Medicare Other

## 2019-02-05 ENCOUNTER — Ambulatory Visit (INDEPENDENT_AMBULATORY_CARE_PROVIDER_SITE_OTHER): Payer: Medicare Other | Admitting: Orthopaedic Surgery

## 2019-02-05 DIAGNOSIS — M25511 Pain in right shoulder: Secondary | ICD-10-CM | POA: Diagnosis not present

## 2019-02-05 MED ORDER — LIDOCAINE HCL 1 % IJ SOLN
3.0000 mL | INTRAMUSCULAR | Status: AC | PRN
  Administered 2019-02-05: 3 mL

## 2019-02-05 MED ORDER — TRAMADOL HCL 50 MG PO TABS: 50.0000 mg | ORAL_TABLET | Freq: Four times a day (QID) | ORAL | 0 refills | Status: DC | PRN

## 2019-02-05 MED ORDER — METHYLPREDNISOLONE ACETATE 40 MG/ML IJ SUSP
40.0000 mg | INTRAMUSCULAR | Status: AC | PRN
  Administered 2019-02-05: 40 mg via INTRA_ARTICULAR

## 2019-02-05 NOTE — Progress Notes (Signed)
Office Visit Note   Patient: Diana Bowers           Date of Birth: 03/04/1951           MRN: [id number] Visit Date: 02/05/2019              Requested by: Caffie Damme, MD [address] [place], [place] [address] PCP: Caffie Damme, MD   Assessment & Plan: Visit Diagnoses:  1. Right shoulder pain, unspecified chronicity     Plan: I do feel is appropriate to try a steroid injection in her right shoulder in the subacromial space and she agrees with this as well.  I explained the risk and benefits of injections and she tolerated it well.  Follow-up will be as needed.  If this flares up on her again she will let us know.  Follow-Up Instructions: Return if symptoms worsen or fail to improve.   Orders:  Orders Placed This Encounter  Procedures  . Large Joint Inj  . XR Shoulder Right   No orders of the defined types were placed in this encounter.     Procedures: Large Joint Inj: R subacromial bursa on 02/05/2019 2:44 PM Indications: pain and diagnostic evaluation Details: 22 G 1.5 in needle  Arthrogram: No  Medications: 3 mL lidocaine 1 %; 40 mg methylPREDNISolone acetate 40 MG/ML Outcome: tolerated well, no immediate complications Procedure, treatment alternatives, risks and benefits explained, specific risks discussed. Consent was given by the patient. Immediately prior to procedure a time out was called to verify the correct patient, procedure, equipment, support staff and site/side marked as required. Patient was prepped and draped in the usual sterile fashion.       Clinical Data: No additional findings.   Subjective: Chief Complaint  Patient presents with  . Right Shoulder - Pain    HPI  Review of Systems   Objective: Vital Signs: There were no vitals taken for this visit.  Physical Exam  Ortho Exam  Specialty Comments:  No specialty comments available.  Imaging: Xr Shoulder Right  Result Date: 02/05/2019 3 views of the right shoulder shows no  acute findings.  There is arthritic changes at the [facility] joint.  The glenohumeral joint is well-maintained and congruent.    PMFS History: Patient Active Problem List   Diagnosis Date Noted  . 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 Status post revision of total replacement of left knee 09/15/2018  . Trochanteric bursitis, left hip 08/10/2017  . Osteoarthritis of right hip 07/22/2015  . 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 Acute blood loss anemia 01/04/2013  . Osteoarthritis of left knee 01/01/2013    Class: Chronic   Past Medical History:  Diagnosis Date  . Arthritis    "left knee" (01/01/2013)  . Diabetes mellitus without complication (HCC)    borderline no med  . DVT of lower extremity (deep venous thrombosis) (HCC) 2007   "LLE; from birth control pill" (01/01/2013)  . GERD (gastroesophageal reflux disease)   . Headache    pt. states migraines at times  . Hypercholesteremia    Crestor  . Hypertension     Family History  Problem Relation Age of Onset  . Breast cancer Neg Hx     Past Surgical History:  Procedure Laterality Date  . ANTERIOR CERVICAL DECOMP/DISCECTOMY FUSION  2006  . CARPAL TUNNEL RELEASE  2000   "right" (01/01/2013)  . CESAREAN SECTION  1978  . COLONOSCOPY    .  ESOPHAGOGASTRODUODENOSCOPY    . I&D KNEE WITH POLY EXCHANGE Left 09/15/2018   Procedure: POLY EXCHANGE LEFT KNEE,/,PATELLAR COMPONENT REVISION  STEROID INJECTION LEFT HIP;  Surgeon: Kathryne Hitch, MD;  Location: [facility];  Service: Orthopedics;  Laterality: Left;  . KIDNEY DONATION  1994  . KNEE ARTHROPLASTY  01/01/2013   Procedure: COMPUTER ASSISTED TOTAL KNEE ARTHROPLASTY;  Surgeon: Kerrin Champagne, MD;  Location: [facility] OR;  Service: Orthopedics;  Laterality: Left;  Left computer assisted total knee replacement  . LIPOMA EXCISION  2000   back  . REPLACEMENT TOTAL KNEE  01/01/2013   "left" (01/01/2013)  . TOTAL HIP ARTHROPLASTY Right 07/22/2015    Procedure: RIGHT TOTAL HIP ARTHROPLASTY ANTERIOR APPROACH;  Surgeon: Kathryne Hitch, MD;  Location: [facility] OR;  Service: Orthopedics;  Laterality: Right;   Social History   Occupational History  . Not on file  Tobacco Use  . Smoking status: Never Smoker  . Smokeless tobacco: Never Used  Substance and Sexual Activity  . Alcohol use: No  . Drug use: No  . Sexual activity: Yes

## 2019-03-02 ENCOUNTER — Other Ambulatory Visit: Payer: Self-pay | Admitting: Internal Medicine

## 2019-03-02 DIAGNOSIS — Z1231 Encounter for screening mammogram for malignant neoplasm of breast: Secondary | ICD-10-CM

## 2019-04-09 ENCOUNTER — Ambulatory Visit: Payer: Medicare Other

## 2019-05-09 ENCOUNTER — Telehealth: Payer: Self-pay | Admitting: Orthopaedic Surgery

## 2019-05-09 NOTE — Telephone Encounter (Signed)
Patient no longer needs antibiotics for dental appointments.  I called patient to advise. She would like to pick up note.  Note at front desk.

## 2019-05-09 NOTE — Telephone Encounter (Signed)
Pt called asking for a rx for the dentist. Her last surgery last year.   Her dentist is requiring something stating she doesn't need any antibiotics

## 2019-05-28 ENCOUNTER — Ambulatory Visit: Payer: Medicare Other

## 2019-05-30 ENCOUNTER — Other Ambulatory Visit: Payer: Self-pay

## 2019-05-30 ENCOUNTER — Encounter: Payer: Self-pay | Admitting: Orthopaedic Surgery

## 2019-05-30 ENCOUNTER — Ambulatory Visit (INDEPENDENT_AMBULATORY_CARE_PROVIDER_SITE_OTHER): Payer: Medicare Other | Admitting: Orthopaedic Surgery

## 2019-05-30 DIAGNOSIS — M7062 Trochanteric bursitis, left hip: Secondary | ICD-10-CM | POA: Diagnosis not present

## 2019-05-30 MED ORDER — LIDOCAINE HCL 1 % IJ SOLN
3.0000 mL | INTRAMUSCULAR | Status: AC | PRN
  Administered 2019-05-30: 3 mL

## 2019-05-30 MED ORDER — METHYLPREDNISOLONE ACETATE 40 MG/ML IJ SUSP
40.0000 mg | INTRAMUSCULAR | Status: AC | PRN
  Administered 2019-05-30: 40 mg via INTRA_ARTICULAR

## 2019-05-30 NOTE — Progress Notes (Signed)
Office Visit Note   Patient: Diana Bowers           Date of Birth: 1951/01/27           MRN: [id number] Visit Date: 05/30/2019              Requested by: Caffie Damme, MD [address] [place], [place] [address] PCP: Caffie Damme, MD   Assessment & Plan: Visit Diagnoses:  1. Trochanteric bursitis, left hip     Plan: Given her success of the injection 7 months ago and her left hip pain given that this still fits with the clinical exam findings of trochanteric bursitis I did offer steroid injection in the left hip trochanteric area.  She definitely wanted this and understands the risk and benefits of injections.  I showed her stretching exercises to continue to try to try to avoid the repetitive activity that caused this to flareup.  She tolerated injection well.  All question concerns were answered and addressed.  Follow will be as needed.  Follow-Up Instructions: Return if symptoms worsen or fail to improve.   Orders:  Orders Placed This Encounter  Procedures  . Large Joint Inj   No orders of the defined types were placed in this encounter.     Procedures: Large Joint Inj: L greater trochanter on 05/30/2019 2:22 PM Indications: pain and diagnostic evaluation Details: 22 G 1.5 in needle, lateral approach  Arthrogram: No  Medications: 3 mL lidocaine 1 %; 40 mg methylPREDNISolone acetate 40 MG/ML Outcome: tolerated well, no immediate complications Procedure, treatment alternatives, risks and benefits explained, specific risks discussed. Consent was given by the patient. Immediately prior to procedure a time out was called to verify the correct patient, procedure, equipment, support staff and site/side marked as required. Patient was prepped and draped in the usual sterile fashion.       Clinical Data: No additional findings.   Subjective: Chief Complaint  Patient presents with  . Left Hip - Pain  The patient comes in today for evaluation treatment of left hip pain.  We  have seen her for left hip pain with trochanteric bursitis before.  We last provided injection around the trochanteric area over this left hip in October 2019 that is been about 7 months now she is done wonderfully until recently.  She started develop left hip pain over the trochanteric area with exercising.  Since the injection helped for so long she would like to try another one today.  She is had no other acute change in her medical status.  She denies any groin pain on the left side.  She is worked on activity modification and weight loss as well.  HPI  Review of Systems She currently denies any headache, chest pain, shortness of breath, fever, chills, nausea, vomiting  Objective: Vital Signs: There were no vitals taken for this visit.  Physical Exam She is alert and orient x3 and in no acute distress Ortho Exam Examination of her left hip shows no pain in the groin and excellent internal and external rotation.  There is pain to palpation of the trochanteric area only. Specialty Comments:  No specialty comments available.  Imaging: No results found.   PMFS History: Patient Active Problem List   Diagnosis Date Noted  . 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 Status post revision of total replacement of left knee 09/15/2018  . Trochanteric bursitis, left hip 08/10/2017  . Osteoarthritis of right hip 07/22/2015  .  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 Acute blood loss anemia 01/04/2013  . Osteoarthritis of left knee 01/01/2013    Class: Chronic   Past Medical History:  Diagnosis Date  . Arthritis    "left knee" (01/01/2013)  . Diabetes mellitus without complication (HCC)    borderline no med  . DVT of lower extremity (deep venous thrombosis) (HCC) 2007   "LLE; from birth control pill" (01/01/2013)  . GERD (gastroesophageal reflux disease)   . Headache    pt. states migraines at times  . Hypercholesteremia    Crestor  .  Hypertension     Family History  Problem Relation Age of Onset  . Breast cancer Neg Hx     Past Surgical History:  Procedure Laterality Date  . ANTERIOR CERVICAL DECOMP/DISCECTOMY FUSION  2006  . CARPAL TUNNEL RELEASE  2000   "right" (01/01/2013)  . CESAREAN SECTION  1978  . COLONOSCOPY    . ESOPHAGOGASTRODUODENOSCOPY    . I&D KNEE WITH POLY EXCHANGE Left 09/15/2018   Procedure: POLY EXCHANGE LEFT KNEE,/,PATELLAR COMPONENT REVISION  STEROID INJECTION LEFT HIP;  Surgeon: Kathryne HitchBlackman, Rhilee Currin Y, MD;  Location: [facility];  Service: Orthopedics;  Laterality: Left;  . KIDNEY DONATION  1994  . KNEE ARTHROPLASTY  01/01/2013   Procedure: COMPUTER ASSISTED TOTAL KNEE ARTHROPLASTY;  Surgeon: Kerrin ChampagneJames E Nitka, MD;  Location: [facility];  Service: Orthopedics;  Laterality: Left;  Left computer assisted total knee replacement  . LIPOMA EXCISION  2000   back  . REPLACEMENT TOTAL KNEE  01/01/2013   "left" (01/01/2013)  . TOTAL HIP ARTHROPLASTY Right 07/22/2015   Procedure: RIGHT TOTAL HIP ARTHROPLASTY ANTERIOR APPROACH;  Surgeon: Kathryne Hitchhristopher Y Lovell Nuttall, MD;  [facility];  Service: Orthopedics;  Laterality: Right;   Social History   Occupational History  . Not on file  Tobacco Use  . Smoking status: Never Smoker  . Smokeless tobacco: Never Used  Substance and Sexual Activity  . Alcohol use: No  . Drug use: No  . Sexual activity: Yes

## 2019-06-01 ENCOUNTER — Telehealth: Payer: Self-pay | Admitting: Orthopaedic Surgery

## 2019-06-01 MED ORDER — TRAMADOL HCL 50 MG PO TABS: 50.0000 mg | ORAL_TABLET | Freq: Four times a day (QID) | ORAL | 0 refills | Status: DC | PRN

## 2019-06-01 NOTE — Telephone Encounter (Signed)
Patient called requesting Tramadol. Please call patient to advise.  [phone]

## 2019-06-01 NOTE — Telephone Encounter (Signed)
Please advise 

## 2019-06-16 ENCOUNTER — Other Ambulatory Visit: Payer: Self-pay

## 2019-06-16 ENCOUNTER — Ambulatory Visit
Admission: RE | Admit: 2019-06-16 | Discharge: 2019-06-16 | Disposition: A | Discharge status: Home / Self Care | Payer: Medicare Other | Source: Ambulatory Visit | Attending: Internal Medicine | Admitting: Internal Medicine

## 2019-06-16 DIAGNOSIS — Z1231 Encounter for screening mammogram for malignant neoplasm of breast: Secondary | ICD-10-CM

## 2019-09-06 ENCOUNTER — Other Ambulatory Visit: Payer: Self-pay | Admitting: Orthopaedic Surgery

## 2019-09-06 ENCOUNTER — Telehealth: Payer: Self-pay | Admitting: Orthopaedic Surgery

## 2019-09-06 MED ORDER — TRAMADOL HCL 50 MG PO TABS: 50.0000 mg | ORAL_TABLET | Freq: Four times a day (QID) | ORAL | 0 refills | Status: DC | PRN

## 2019-09-06 NOTE — Telephone Encounter (Signed)
Please advise 

## 2019-09-06 NOTE — Telephone Encounter (Signed)
Patient called. She would like for you to call in Tramadol for her to her pharmacy. Her call back number is [phone]. Thanks

## 2019-09-24 ENCOUNTER — Ambulatory Visit: Payer: Self-pay

## 2019-09-24 ENCOUNTER — Ambulatory Visit (INDEPENDENT_AMBULATORY_CARE_PROVIDER_SITE_OTHER): Payer: Medicare Other | Admitting: Orthopaedic Surgery

## 2019-09-24 DIAGNOSIS — M25512 Pain in left shoulder: Secondary | ICD-10-CM | POA: Diagnosis not present

## 2019-09-24 DIAGNOSIS — Z96652 Presence of left artificial knee joint: Secondary | ICD-10-CM | POA: Diagnosis not present

## 2019-09-24 NOTE — Progress Notes (Signed)
The patient is well-known to [place].  She is a year out from her revision arthroplasty of her left knee.  This was just upsizing the polyethylene liner.  She is done very well with that left knee.  She does have a history of a right total hip arthroplasty.  She also has known end-stage arthritis of her right knee.  She says right now she is doing well and she does walk 2 miles every day and sometimes she does walk 5 miles.  She has no issues with her knees or her hips.  She knows she has severe arthritis in her right knee but is not bothering her enough right now to do anything about.  She has no issues with her right operative hip or her left knee.  On examination of her left knee she has excellent range of motion of the left knee.  It is ligamentously stable as well.  There is no effusion.  Her incisions healed nicely.  2 views of the left knee are obtained and show well-seated total knee arthroplasty with no evidence of loosening or other complicating features.  Her right knee on the AP view does show varus malalignment with significant osteoarthritis of the medial compartment.  This point she is doing well enough that she can follow-up as needed.  She understands that if there is any issues that arise with her right hip or her left knee or even her right knee do not hesitate to come see [place].  All question concerns were answered addressed.

## 2019-11-08 ENCOUNTER — Telehealth: Payer: Self-pay | Admitting: Orthopaedic Surgery

## 2019-11-08 ENCOUNTER — Ambulatory Visit: Payer: Medicare Other | Admitting: Physician Assistant

## 2019-11-08 MED ORDER — TRAMADOL HCL 50 MG PO TABS: 50.0000 mg | ORAL_TABLET | Freq: Four times a day (QID) | ORAL | 0 refills | Status: DC | PRN

## 2019-11-08 NOTE — Telephone Encounter (Signed)
Pt called in requesting a refill on her tramadol, since she had to reschedule her appt for today due to the weather.   [phone]

## 2019-11-08 NOTE — Telephone Encounter (Signed)
Please advise 

## 2019-11-12 ENCOUNTER — Other Ambulatory Visit: Payer: Self-pay

## 2019-11-12 ENCOUNTER — Ambulatory Visit (INDEPENDENT_AMBULATORY_CARE_PROVIDER_SITE_OTHER): Payer: Medicare Other | Admitting: Physician Assistant

## 2019-11-12 ENCOUNTER — Encounter: Payer: Self-pay | Admitting: Physician Assistant

## 2019-11-12 DIAGNOSIS — M7062 Trochanteric bursitis, left hip: Secondary | ICD-10-CM | POA: Diagnosis not present

## 2019-11-12 MED ORDER — METHYLPREDNISOLONE ACETATE 40 MG/ML IJ SUSP
40.0000 mg | INTRAMUSCULAR | Status: AC | PRN
  Administered 2019-11-12: 40 mg via INTRA_ARTICULAR

## 2019-11-12 MED ORDER — LIDOCAINE HCL 1 % IJ SOLN
3.0000 mL | INTRAMUSCULAR | Status: AC | PRN
  Administered 2019-11-12: 12:00:00 3 mL

## 2019-11-12 NOTE — Progress Notes (Signed)
Office Visit Note   Patient: Diana Bowers           Date of Birth: [birth year]           MRN: [id number] Visit Date: 11/12/2019              Requested by: Caffie Damme, MD [address] [place],  [place] [address] PCP: Caffie Damme, MD   Assessment & Plan: Visit Diagnoses:  1. Trochanteric bursitis, left hip     Plan:  She shown IT band stretching exercises.  She will follow-up with [place] on an as-needed basis pain persist or becomes worse.  Questions encouraged and answered  Follow-Up Instructions: Return if symptoms worsen or fail to improve.   Orders:  No orders of the defined types were placed in this encounter.  No orders of the defined types were placed in this encounter.     Procedures: Large Joint Inj: L greater trochanter on 11/12/2019 11:37 AM Indications: pain Details: 22 G 1.5 in needle, lateral approach  Arthrogram: No  Medications: 3 mL lidocaine 1 %; 40 mg methylPREDNISolone acetate 40 MG/ML Outcome: tolerated well, no immediate complications Procedure, treatment alternatives, risks and benefits explained, specific risks discussed. Consent was given by the patient. Immediately prior to procedure a time out was called to verify the correct patient, procedure, equipment, support staff and site/side marked as required. Patient was prepped and draped in the usual sterile fashion.       Clinical Data: No additional findings.   Subjective: Chief Complaint  Patient presents with  . Left Thigh - Weakness    HPI Diana Bowers comes in today with right thigh pain.  Denies any radicular symptoms down the leg.  She states the leg almost catches at times and causes her to feel like she is in a fall.  Is been ongoing for few weeks but worse over the last week.  No known injury.  Worse with sitting for too long. Review of Systems Negative for fevers chills shortness of breath chest pain  Objective: Vital Signs: There were no vitals taken for this visit.  Physical  Exam General: Well-developed well-nourished female no acute distress mood affect appropriate. Psych alert and oriented x3. Ortho Exam Bilateral hips excellent range of motion both hips without pain.  She has tenderness over the left trochanteric region.  Also tenderness down the IT band. Specialty Comments:  No specialty comments available.  Imaging: No results found.   PMFS History: Patient Active Problem List   Diagnosis Date Noted  . 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 Status post revision of total replacement of left knee 09/15/2018  . Trochanteric bursitis, left hip 08/10/2017  . Osteoarthritis of right hip 07/22/2015  . 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 Acute blood loss anemia 01/04/2013  . Osteoarthritis of left knee 01/01/2013    Class: Chronic   Past Medical History:  Diagnosis Date  . Arthritis    "left knee" (01/01/2013)  . Diabetes mellitus without complication (HCC)    borderline no med  . DVT of lower extremity (deep venous thrombosis) (HCC) 2007   "LLE; from birth control pill" (01/01/2013)  . GERD (gastroesophageal reflux disease)   . Headache    pt. states migraines at times  . Hypercholesteremia    Crestor  . Hypertension     Family History  Problem Relation Age of Onset  . Breast cancer Neg Hx  Past Surgical History:  Procedure Laterality Date  . ANTERIOR CERVICAL DECOMP/DISCECTOMY FUSION  2006  . CARPAL TUNNEL RELEASE  2000   "right" (01/01/2013)  . [place]  . COLONOSCOPY    . ESOPHAGOGASTRODUODENOSCOPY    . I&D KNEE WITH POLY EXCHANGE Left 09/15/2018   Procedure: POLY EXCHANGE LEFT KNEE,/,PATELLAR COMPONENT REVISION  STEROID INJECTION LEFT HIP;  Surgeon: Mcarthur Rossetti, MD;  Location: [facility];  Service: Orthopedics;  Laterality: Left;  . KIDNEY DONATION  1994  . KNEE ARTHROPLASTY  01/01/2013   Procedure: COMPUTER ASSISTED TOTAL KNEE ARTHROPLASTY;  Surgeon: Jessy Oto,  MD;  Location: [facility];  Service: Orthopedics;  Laterality: Left;  Left computer assisted total knee replacement  . LIPOMA EXCISION  2000   back  . REPLACEMENT TOTAL KNEE  01/01/2013   "left" (01/01/2013)  . TOTAL HIP ARTHROPLASTY Right 07/22/2015   Procedure: RIGHT TOTAL HIP ARTHROPLASTY ANTERIOR APPROACH;  Surgeon: Mcarthur Rossetti, MD;  Location: [facility];  Service: Orthopedics;  Laterality: Right;   Social History   Occupational History  . Not on file  Tobacco Use  . Smoking status: Never Smoker  . Smokeless tobacco: Never Used  Substance and Sexual Activity  . Alcohol use: No  . Drug use: No  . Sexual activity: Yes

## 2020-01-03 ENCOUNTER — Telehealth: Payer: Self-pay | Admitting: Orthopaedic Surgery

## 2020-01-03 MED ORDER — TRAMADOL HCL 50 MG PO TABS: 50.0000 mg | ORAL_TABLET | Freq: Four times a day (QID) | ORAL | 0 refills | Status: DC | PRN

## 2020-01-03 NOTE — Telephone Encounter (Signed)
Patient called requesting a refill of Tramadol. Patient pharmacy [facility]. Patient phone number is [phone].

## 2020-01-03 NOTE — Telephone Encounter (Signed)
Please advise 

## 2020-04-09 ENCOUNTER — Ambulatory Visit (INDEPENDENT_AMBULATORY_CARE_PROVIDER_SITE_OTHER): Payer: Medicare Other | Admitting: Orthopaedic Surgery

## 2020-04-09 ENCOUNTER — Other Ambulatory Visit: Payer: Self-pay

## 2020-04-09 DIAGNOSIS — M7062 Trochanteric bursitis, left hip: Secondary | ICD-10-CM | POA: Diagnosis not present

## 2020-04-09 MED ORDER — METHYLPREDNISOLONE ACETATE 40 MG/ML IJ SUSP
40.0000 mg | INTRAMUSCULAR | Status: AC | PRN
  Administered 2020-04-09: 11:00:00 40 mg via INTRA_ARTICULAR

## 2020-04-09 MED ORDER — LIDOCAINE HCL 1 % IJ SOLN
3.0000 mL | INTRAMUSCULAR | Status: AC | PRN
  Administered 2020-04-09: 11:00:00 3 mL

## 2020-04-09 NOTE — Progress Notes (Signed)
Office Visit Note   Patient: Diana Bowers           Date of Birth: 10-Oct-1951           MRN: [id number] Visit Date: 04/09/2020              Requested by: [name], MD [place],  [place] [address] PCP: [name], MD   Assessment & Plan: Visit Diagnoses:  1. Trochanteric bursitis, left hip     Plan: Per the patient's wishes I did provide a steroid injection in her left hip trochanteric area.  I agree with this treatment plan as well.  She understands fully the risk and benefits of injections and this has been successful in the past.  She will continue stretching exercises as well.  She did tolerate the injection well without issues.  Follow-Up Instructions: Return if symptoms worsen or fail to improve.   Orders:  Orders Placed This Encounter  Procedures  . Large Joint Inj   No orders of the defined types were placed in this encounter.     Procedures: Large Joint Inj: L greater trochanter on 04/09/2020 10:53 AM Indications: pain and diagnostic evaluation Details: 22 G 1.5 in needle, lateral approach  Arthrogram: No  Medications: 3 mL lidocaine 1 %; 40 mg methylPREDNISolone acetate 40 MG/ML Outcome: tolerated well, no immediate complications Procedure, treatment alternatives, risks and benefits explained, specific risks discussed. Consent was given by the patient. Immediately prior to procedure a time out was called to verify the correct patient, procedure, equipment, support staff and site/side marked as required. Patient was prepped and draped in the usual sterile fashion.       Clinical Data: No additional findings.   Subjective: Chief Complaint  Patient presents with  . Left Hip - Pain  The patient is well-known to [place].  She does deal occasion with trochanteric bursitis of the left hip.  She has had a flareup of left hip pain.  She is requesting an injection over the trochanteric area today.  The last time we injected her was about 5 months ago  when she says it helps quite a bit.  She is active 68 year old and is still working on weight loss.  She does go and workout several times a week.  She does a lot of exercise walking as well.  She has had no other acute change in her medical status.  HPI  Review of Systems She currently denies any headache, chest pain, shortness of breath, fever, chills, nausea, vomiting  Objective: Vital Signs: There were no vitals taken for this visit.  Physical Exam She is alert and orient x3 and in no acute distress Ortho Exam Examination of her left hip shows full range of motion with no pain in the groin at all.  She is point tender to palpation of the trochanteric area and the proximal IT band. Specialty Comments:  No specialty comments available.  Imaging: No results found.   PMFS History: Patient Active Problem List   Diagnosis Date Noted  . 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 Status post revision of total replacement of left knee 09/15/2018  . Trochanteric bursitis, left hip 08/10/2017  . Osteoarthritis of right hip 07/22/2015  . 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 Acute blood loss anemia 01/04/2013  . Osteoarthritis of left knee 01/01/2013    Class: Chronic   Past Medical History:  Diagnosis Date  . Arthritis    "  left knee" (01/01/2013)  . Diabetes mellitus without complication (HCC)    borderline no med  . DVT of lower extremity (deep venous thrombosis) (HCC) 2007   "LLE; from birth control pill" (01/01/2013)  . GERD (gastroesophageal reflux disease)   . Headache    pt. states migraines at times  . Hypercholesteremia    Crestor  . Hypertension     Family History  Problem Relation Age of Onset  . Breast cancer Neg Hx     Past Surgical History:  Procedure Laterality Date  . ANTERIOR CERVICAL DECOMP/DISCECTOMY FUSION  2006  . CARPAL TUNNEL RELEASE  2000   "right" (01/01/2013)  . CESAREAN SECTION  1978  . COLONOSCOPY      . ESOPHAGOGASTRODUODENOSCOPY    . I & D KNEE WITH POLY EXCHANGE Left 09/15/2018   Procedure: POLY EXCHANGE LEFT KNEE,/,PATELLAR COMPONENT REVISION  STEROID INJECTION LEFT HIP;  Surgeon: Kathryne Hitch, MD;  Location: [facility];  Service: Orthopedics;  Laterality: Left;  . KIDNEY DONATION  1994  . KNEE ARTHROPLASTY  01/01/2013   Procedure: COMPUTER ASSISTED TOTAL KNEE ARTHROPLASTY;  Surgeon: Kerrin Champagne, MD;  Location: [facility];  Service: Orthopedics;  Laterality: Left;  Left computer assisted total knee replacement  . LIPOMA EXCISION  2000   back  . REPLACEMENT TOTAL KNEE  01/01/2013   "left" (01/01/2013)  . TOTAL HIP ARTHROPLASTY Right 07/22/2015   Procedure: RIGHT TOTAL HIP ARTHROPLASTY ANTERIOR APPROACH;  Surgeon: Kathryne Hitch, MD;  Location: [facility];  Service: Orthopedics;  Laterality: Right;   Social History   Occupational History  . Not on file  Tobacco Use  . Smoking status: Never Smoker  . Smokeless tobacco: Never Used  Substance and Sexual Activity  . Alcohol use: No  . Drug use: No  . Sexual activity: Yes

## 2020-05-12 ENCOUNTER — Other Ambulatory Visit: Payer: Self-pay | Admitting: Internal Medicine

## 2020-05-12 DIAGNOSIS — Z1231 Encounter for screening mammogram for malignant neoplasm of breast: Secondary | ICD-10-CM

## 2020-06-16 ENCOUNTER — Other Ambulatory Visit: Payer: Self-pay

## 2020-06-16 ENCOUNTER — Ambulatory Visit
Admission: RE | Admit: 2020-06-16 | Discharge: 2020-06-16 | Disposition: A | Discharge status: Home / Self Care | Payer: Medicare Other | Source: Ambulatory Visit | Attending: Internal Medicine | Admitting: Internal Medicine

## 2020-06-16 DIAGNOSIS — Z1231 Encounter for screening mammogram for malignant neoplasm of breast: Secondary | ICD-10-CM

## 2020-07-24 ENCOUNTER — Telehealth: Payer: Self-pay

## 2020-07-24 MED ORDER — TRAMADOL HCL 50 MG PO TABS: 50.0000 mg | ORAL_TABLET | Freq: Four times a day (QID) | ORAL | 0 refills | Status: DC | PRN

## 2020-07-24 NOTE — Telephone Encounter (Signed)
Please advise 

## 2020-07-24 NOTE — Telephone Encounter (Signed)
Patient called in for refill on tramadol  

## 2020-08-04 ENCOUNTER — Ambulatory Visit (INDEPENDENT_AMBULATORY_CARE_PROVIDER_SITE_OTHER): Payer: Medicare Other | Admitting: Orthopaedic Surgery

## 2020-08-04 DIAGNOSIS — M7062 Trochanteric bursitis, left hip: Secondary | ICD-10-CM

## 2020-08-04 MED ORDER — METHYLPREDNISOLONE ACETATE 40 MG/ML IJ SUSP
40.0000 mg | INTRAMUSCULAR | Status: AC | PRN
  Administered 2020-08-04: 40 mg via INTRA_ARTICULAR

## 2020-08-04 MED ORDER — LIDOCAINE HCL 1 % IJ SOLN
3.0000 mL | INTRAMUSCULAR | Status: AC | PRN
  Administered 2020-08-04: 3 mL

## 2020-08-04 NOTE — Progress Notes (Signed)
Office Visit Note   Patient: Diana Bowers           Date of Birth: 1951-10-02           MRN: [id number] Visit Date: 08/04/2020              Requested by: Caffie Damme, MD [address] [place],  [place] [address] PCP: Caffie Damme, MD   Assessment & Plan: Visit Diagnoses:  1. Trochanteric bursitis, left hip     Plan: I did provide another injection in her left trochanteric area with a steroid to treat her trochanteric bursitis and she tolerated this well.  She is going work on stretching exercises massaging this area and Voltaren gel.  Follow-up can be as needed.  From a right knee standpoint for osteoarthritis, if this starts to bother her enough she will call because we would consider hyaluronic acid for that knee to treat the pain from osteoarthritis.  She will let us know.  Otherwise follow-up is as needed.  Follow-Up Instructions: Return if symptoms worsen or fail to improve.   Orders:  Orders Placed This Encounter  Procedures   Large Joint Inj   No orders of the defined types were placed in this encounter.     Procedures: Large Joint Inj: L greater trochanter on 08/04/2020 4:25 PM Indications: pain and diagnostic evaluation Details: 22 G 1.5 in needle, lateral approach  Arthrogram: No  Medications: 3 mL lidocaine 1 %; 40 mg methylPREDNISolone acetate 40 MG/ML Outcome: tolerated well, no immediate complications Procedure, treatment alternatives, risks and benefits explained, specific risks discussed. Consent was given by the patient. Immediately prior to procedure a time out was called to verify the correct patient, procedure, equipment, support staff and site/side marked as required. Patient was prepped and draped in the usual sterile fashion.       Clinical Data: No additional findings.   Subjective: Chief Complaint  Patient presents with   Left Hip - Pain  Diana Bowers is well-known to me.  She has been dealing with left hip trochanteric bursitis for some time  now.  She last had a steroid injection in that area about 4 months ago.  She would like to have another one today.  She has a history of a left knee replacement.  She does have osteoarthritis in her right knee but wants to hold off on any intervention for that for some time.  She has had no other acute change in her medical status.  She said the injection does last her a while.  She has not tried Voltaren gel on her hip yet.  I did recommend this to her today.  HPI  Review of Systems She currently denies any headache, chest pain, shortness of breath, fever, chills, nausea, vomiting  Objective: Vital Signs: There were no vitals taken for this visit.  Physical Exam She is alert and oriented x3 and in no acute distress Ortho Exam Examination of her left hip shows pain only to palpation of the trochanteric area with remainder of her hip exam normal. Specialty Comments:  No specialty comments available.  Imaging: No results found.   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   Status post revision of total replacement of left knee 09/15/2018   Trochanteric bursitis, left hip 08/10/2017   Osteoarthritis of right hip 07/22/2015   Status post total replacement of right hip 07/22/2015  Acute blood loss anemia 01/04/2013   Osteoarthritis of left knee 01/01/2013    Class: Chronic   Past Medical History:  Diagnosis Date   Arthritis    "left knee" (01/01/2013)   Diabetes mellitus without complication (HCC)    borderline no med   DVT of lower extremity (deep venous thrombosis) (HCC) 2007   "LLE; from birth control pill" (01/01/2013)   GERD (gastroesophageal reflux disease)    Headache    pt. states migraines at times   Hypercholesteremia    Crestor   Hypertension     Family History  Problem Relation Age of Onset   Breast cancer Neg Hx     Past Surgical History:  Procedure  Laterality Date   ANTERIOR CERVICAL DECOMP/DISCECTOMY FUSION  2006   CARPAL TUNNEL RELEASE  2000   "right" (01/01/2013)   CESAREAN SECTION  1978   COLONOSCOPY     ESOPHAGOGASTRODUODENOSCOPY     I & D KNEE WITH POLY EXCHANGE Left 09/15/2018   Procedure: POLY EXCHANGE LEFT KNEE,/,PATELLAR COMPONENT REVISION  STEROID INJECTION LEFT HIP;  Surgeon: Kathryne Hitch, MD;  Location: [facility];  Service: Orthopedics;  Laterality: Left;   KIDNEY DONATION  1994   KNEE ARTHROPLASTY  01/01/2013   Procedure: COMPUTER ASSISTED TOTAL KNEE ARTHROPLASTY;  Surgeon: Kerrin Champagne, MD;  Location: [facility];  Service: Orthopedics;  Laterality: Left;  Left computer assisted total knee replacement   LIPOMA EXCISION  2000   back   REPLACEMENT TOTAL KNEE  01/01/2013   "left" (01/01/2013)   TOTAL HIP ARTHROPLASTY Right 07/22/2015   Procedure: RIGHT TOTAL HIP ARTHROPLASTY ANTERIOR APPROACH;  Surgeon: Kathryne Hitch, MD;  Location: [facility];  Service: Orthopedics;  Laterality: Right;   Social History   Occupational History   Not on file  Tobacco Use   Smoking status: Never Smoker   Smokeless tobacco: Never Used  Vaping Use   Vaping Use: Never used  Substance and Sexual Activity   Alcohol use: No   Drug use: No   Sexual activity: Yes

## 2020-09-29 ENCOUNTER — Ambulatory Visit (INDEPENDENT_AMBULATORY_CARE_PROVIDER_SITE_OTHER): Payer: Medicare Other | Admitting: Orthopaedic Surgery

## 2020-09-29 ENCOUNTER — Encounter: Payer: Self-pay | Admitting: Orthopaedic Surgery

## 2020-09-29 DIAGNOSIS — M79642 Pain in left hand: Secondary | ICD-10-CM

## 2020-09-29 DIAGNOSIS — M65342 Trigger finger, left ring finger: Secondary | ICD-10-CM | POA: Diagnosis not present

## 2020-09-29 MED ORDER — LIDOCAINE HCL 1 % IJ SOLN
1.0000 mL | INTRAMUSCULAR | Status: AC | PRN
  Administered 2020-09-29: 1 mL

## 2020-09-29 MED ORDER — METHYLPREDNISOLONE ACETATE 40 MG/ML IJ SUSP
40.0000 mg | INTRAMUSCULAR | Status: AC | PRN
  Administered 2020-09-29: 40 mg

## 2020-09-29 NOTE — Progress Notes (Signed)
Office Visit Note   Patient: Diana Bowers           Date of Birth: 07-19-1951           MRN: [id number] Visit Date: 09/29/2020              Requested by: Caffie Damme, MD [address] [place],  [place] [address] PCP: Caffie Damme, MD   Assessment & Plan: Visit Diagnoses:  1. Trigger finger, left ring finger     Plan: Per the patient's request I did provide a steroid injection of the A1 pulley of the left ring finger which she tolerated well.  All questions and concerns were answered and addressed.  Follow-up can be as needed.  Follow-Up Instructions: Return if symptoms worsen or fail to improve.   Orders:  Orders Placed This Encounter  Procedures   Hand/UE Inj   No orders of the defined types were placed in this encounter.     Procedures: Hand/UE Inj: L ring A1 for trigger finger on 09/29/2020 3:53 PM Medications: 1 mL lidocaine 1 %; 40 mg methylPREDNISolone acetate 40 MG/ML      Clinical Data: No additional findings.   Subjective: Chief Complaint  Patient presents with   Left Hand - Pain  The patient comes in today with chief complaint of left hand pain with triggering of the ring finger.  She last had this about 6 years ago.  She says she wakes up with it hurting quite a bit in the morning with triggering worse in the mornings in the evening.  She denies any other acute medical issues at all.  She is requesting an injection today with steroid in her left hand.  HPI  Review of Systems She currently denies any headache, chest pain, shortness of breath, fever, chills, nausea, vomiting  Objective: Vital Signs: There were no vitals taken for this visit.  Physical Exam She is alert and oriented x3 and in no acute distress Ortho Exam Examination of her left hand shows pain over the A1 pulley with active triggering on the ring finger.  The remainder of the hand exam is normal. Specialty Comments:  No specialty comments available.  Imaging: No results  found.   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   Status post revision of total replacement of left knee 09/15/2018   Trochanteric bursitis, left hip 08/10/2017   Osteoarthritis of right hip 07/22/2015   Status post total replacement of right hip 07/22/2015   Acute blood loss anemia 01/04/2013   Osteoarthritis of left knee 01/01/2013    Class: Chronic   Past Medical History:  Diagnosis Date   Arthritis    "left knee" (01/01/2013)   Diabetes mellitus without complication (HCC)    borderline no med   DVT of lower extremity (deep venous thrombosis) (HCC) 2007   "LLE; from birth control pill" (01/01/2013)   GERD (gastroesophageal reflux disease)    Headache    pt. states migraines at times   Hypercholesteremia    Crestor   Hypertension     Family History  Problem Relation Age of Onset   Breast cancer Neg Hx     Past Surgical History:  Procedure Laterality Date   ANTERIOR CERVICAL DECOMP/DISCECTOMY FUSION  2006   CARPAL TUNNEL RELEASE  2000   "right" (01/01/2013)   CESAREAN SECTION  1978   COLONOSCOPY     ESOPHAGOGASTRODUODENOSCOPY  I & D KNEE WITH POLY EXCHANGE Left 09/15/2018   Procedure: POLY EXCHANGE LEFT KNEE,/,PATELLAR COMPONENT REVISION  STEROID INJECTION LEFT HIP;  Surgeon: Kathryne Hitch, MD;  Location: [facility];  Service: Orthopedics;  Laterality: Left;   KIDNEY DONATION  1994   KNEE ARTHROPLASTY  01/01/2013   Procedure: COMPUTER ASSISTED TOTAL KNEE ARTHROPLASTY;  Surgeon: Kerrin Champagne, MD;  Location: [facility];  Service: Orthopedics;  Laterality: Left;  Left computer assisted total knee replacement   LIPOMA EXCISION  2000   back   REPLACEMENT TOTAL KNEE  01/01/2013   "left" (01/01/2013)   TOTAL HIP ARTHROPLASTY Right 07/22/2015   Procedure: RIGHT TOTAL HIP ARTHROPLASTY ANTERIOR APPROACH;  Surgeon: Kathryne Hitch, MD;  Location: [facility];   Service: Orthopedics;  Laterality: Right;   Social History   Occupational History   Not on file  Tobacco Use   Smoking status: Never Smoker   Smokeless tobacco: Never Used  Vaping Use   Vaping Use: Never used  Substance and Sexual Activity   Alcohol use: No   Drug use: No   Sexual activity: Yes

## 2020-11-08 ENCOUNTER — Other Ambulatory Visit: Payer: Self-pay

## 2020-11-08 ENCOUNTER — Ambulatory Visit: Payer: Medicare Other | Attending: Internal Medicine

## 2020-11-08 DIAGNOSIS — Z23 Encounter for immunization: Secondary | ICD-10-CM

## 2020-11-08 NOTE — Progress Notes (Signed)
   Covid-19 Vaccination Clinic  Name:  CATHARINE KETTLEWELL    MRN: [id number] DOB: 1951/04/11  11/08/2020  Ms. Gilreath was observed post Covid-19 immunization for 15 minutes without incident. She was provided with Vaccine Information Sheet and instruction to access the V-Safe system.   Ms. Leidner was instructed to call 911 with any severe reactions post vaccine: Marland Kitchen Difficulty breathing  . Swelling of face and throat  . A fast heartbeat  . A bad rash all over body  . Dizziness and weakness   Immunizations Administered    No immunizations on file.

## 2021-01-14 ENCOUNTER — Ambulatory Visit (INDEPENDENT_AMBULATORY_CARE_PROVIDER_SITE_OTHER): Payer: Medicare Other

## 2021-01-14 ENCOUNTER — Ambulatory Visit (INDEPENDENT_AMBULATORY_CARE_PROVIDER_SITE_OTHER): Payer: Medicare Other | Admitting: Orthopaedic Surgery

## 2021-01-14 ENCOUNTER — Encounter: Payer: Self-pay | Admitting: Orthopaedic Surgery

## 2021-01-14 DIAGNOSIS — M1711 Unilateral primary osteoarthritis, right knee: Secondary | ICD-10-CM | POA: Diagnosis not present

## 2021-01-14 DIAGNOSIS — M25552 Pain in left hip: Secondary | ICD-10-CM

## 2021-01-14 MED ORDER — LIDOCAINE HCL 1 % IJ SOLN
3.0000 mL | INTRAMUSCULAR | Status: AC | PRN
  Administered 2021-01-14: 3 mL

## 2021-01-14 MED ORDER — METHYLPREDNISOLONE ACETATE 40 MG/ML IJ SUSP
40.0000 mg | INTRAMUSCULAR | Status: AC | PRN
  Administered 2021-01-14: 40 mg via INTRA_ARTICULAR

## 2021-01-14 NOTE — Progress Notes (Signed)
Office Visit Note   Patient: Diana Bowers           Date of Birth: 1951/08/30           MRN: [id number] Visit Date: 01/14/2021              Requested by: Caffie Damme, MD [address] [place],  [place] [address] PCP: Caffie Damme, MD   Assessment & Plan: Visit Diagnoses:  1. Pain in left hip   2. Primary osteoarthritis of right knee     Plan: She will let us know how she is doing the trochanteric injection. She continues to have pain in 2 weeks she can call in office and we can either set up an intra-articular injection of the left hip under ultrasound versus MRI to evaluate cartilage of the left hip. Questions were encouraged and answered at length. Follow-up as needed. Discussed with her knee strengthening exercises.  In regards to the knee we could try cortisone injection in the future but would recommend waiting at least 2 weeks as her last hemoglobin A1c was 6.1.  She will let us know how she would like to proceed with the right knee.  She also may benefit from a supplemental injection in the right knee in the future.  Follow-Up Instructions: Return if symptoms worsen or fail to improve.   Orders:  Orders Placed This Encounter  Procedures  . Large Joint Inj: L greater trochanter  . XR Pelvis 1-2 Views   No orders of the defined types were placed in this encounter.     Procedures: Large Joint Inj: L greater trochanter on 01/14/2021 9:55 AM Indications: pain Details: 22 G 1.5 in needle, lateral approach  Arthrogram: No  Medications: 3 mL lidocaine 1 %; 40 mg methylPREDNISolone acetate 40 MG/ML Outcome: tolerated well, no immediate complications Procedure, treatment alternatives, risks and benefits explained, specific risks discussed. Consent was given by the patient. Immediately prior to procedure a time out was called to verify the correct patient, procedure, equipment, support staff and site/side marked as required. Patient was prepped and draped in the usual sterile  fashion.       Clinical Data: No additional findings.   Subjective: Chief Complaint  Patient presents with  . Right Knee - Pain  . Left Hip - Pain    HPI Mrs. Diana Bowers is well-known to Dr. Magnus Ivan service comes in today with left hip pain wanting a left hip trochanteric injection.  She is also wanting to discuss nonsurgical treatment for right knee arthritis.  She has had no new injury to the knee or hip.  States that she has been having pain deep within the left hip groin region and also the lateral aspect of the hip no significant radicular symptoms.  She has been "borderline diabetic".  Reports her last hemoglobin A1c was 6.1. Review of Systems  Denies any fevers chills or recent vaccines.  Objective: Vital Signs: There were no vitals taken for this visit.  Physical Exam General well-developed well-nourished female no acute distress. Ortho Exam Right hip excellent range of motion without pain.  Left hip pain with any attempts of internal rotation.  She has tenderness over the left trochanteric region.  Specialty Comments:  No specialty comments available.  Imaging: XR Pelvis 1-2 Views  Result Date: 01/14/2021 AP pelvis: Right total hip arthroplasty components well-seated.  No acute fractures.  Left hip is slightly elongated femoral head but the weightbearing results base of the hip joint is well-maintained.  Left hip is well located.  Periarticular superior spur present left hip.    PMFS History: Patient Active Problem List   Diagnosis Date Noted  . 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 Status post revision of total replacement of left knee 09/15/2018  . Trochanteric bursitis, left hip 08/10/2017  . Osteoarthritis of right hip 07/22/2015  . 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 Acute blood loss anemia 01/04/2013  . Osteoarthritis of left knee 01/01/2013    Class: Chronic   Past Medical History:  Diagnosis Date  .  Arthritis    "left knee" (01/01/2013)  . Diabetes mellitus without complication (HCC)    borderline no med  . DVT of lower extremity (deep venous thrombosis) (HCC) 2007   "LLE; from birth control pill" (01/01/2013)  . GERD (gastroesophageal reflux disease)   . Headache    pt. states migraines at times  . Hypercholesteremia    Crestor  . Hypertension     Family History  Problem Relation Age of Onset  . Breast cancer Neg Hx     Past Surgical History:  Procedure Laterality Date  . ANTERIOR CERVICAL DECOMP/DISCECTOMY FUSION  2006  . CARPAL TUNNEL RELEASE  2000   "right" (01/01/2013)  . CESAREAN SECTION  1978  . COLONOSCOPY    . ESOPHAGOGASTRODUODENOSCOPY    . I & D KNEE WITH POLY EXCHANGE Left 09/15/2018   Procedure: POLY EXCHANGE LEFT KNEE,/,PATELLAR COMPONENT REVISION  STEROID INJECTION LEFT HIP;  Surgeon: Kathryne Hitch, MD;  Location: [facility];  Service: Orthopedics;  Laterality: Left;  . KIDNEY DONATION  1994  . KNEE ARTHROPLASTY  01/01/2013   Procedure: COMPUTER ASSISTED TOTAL KNEE ARTHROPLASTY;  Surgeon: Kerrin Champagne, MD;  Location: [facility];  Service: Orthopedics;  Laterality: Left;  Left computer assisted total knee replacement  . LIPOMA EXCISION  2000   back  . REPLACEMENT TOTAL KNEE  01/01/2013   "left" (01/01/2013)  . TOTAL HIP ARTHROPLASTY Right 07/22/2015   Procedure: RIGHT TOTAL HIP ARTHROPLASTY ANTERIOR APPROACH;  Surgeon: Kathryne Hitch, MD;  [facility];  Service: Orthopedics;  Laterality: Right;   Social History   Occupational History  . Not on file  Tobacco Use  . Smoking status: Never Smoker  . Smokeless tobacco: Never Used  Vaping Use  . Vaping Use: Never used  Substance and Sexual Activity  . Alcohol use: No  . Drug use: No  . Sexual activity: Yes

## 2021-03-04 ENCOUNTER — Encounter: Payer: Self-pay | Admitting: Orthopaedic Surgery

## 2021-03-04 ENCOUNTER — Ambulatory Visit (INDEPENDENT_AMBULATORY_CARE_PROVIDER_SITE_OTHER): Payer: Medicare Other | Admitting: Orthopaedic Surgery

## 2021-03-04 ENCOUNTER — Telehealth: Payer: Self-pay | Admitting: Orthopaedic Surgery

## 2021-03-04 DIAGNOSIS — G8929 Other chronic pain: Secondary | ICD-10-CM | POA: Diagnosis not present

## 2021-03-04 DIAGNOSIS — M1711 Unilateral primary osteoarthritis, right knee: Secondary | ICD-10-CM | POA: Diagnosis not present

## 2021-03-04 DIAGNOSIS — M25561 Pain in right knee: Secondary | ICD-10-CM

## 2021-03-04 MED ORDER — METHYLPREDNISOLONE ACETATE 40 MG/ML IJ SUSP
40.0000 mg | INTRAMUSCULAR | Status: AC | PRN
  Administered 2021-03-04: 40 mg via INTRA_ARTICULAR

## 2021-03-04 MED ORDER — LIDOCAINE HCL 1 % IJ SOLN
3.0000 mL | INTRAMUSCULAR | Status: AC | PRN
  Administered 2021-03-04: 3 mL

## 2021-03-04 MED ORDER — TRAMADOL HCL 50 MG PO TABS: 50.0000 mg | ORAL_TABLET | Freq: Three times a day (TID) | ORAL | 0 refills | Status: DC | PRN

## 2021-03-04 NOTE — Telephone Encounter (Signed)
Please advise 

## 2021-03-04 NOTE — Progress Notes (Signed)
Office Visit Note   Patient: Diana Bowers           Date of Birth: [birth year]           MRN: [id number] Visit Date: 03/04/2021              Requested by: Caffie Damme, MD [address] [place],  [place] [address] PCP: Caffie Damme, MD   Assessment & Plan: Visit Diagnoses:  1. Primary osteoarthritis of right knee   2. Chronic pain of right knee     Plan: I did provide a steroid injection in her right knee today which she tolerated well.  She understands fully the risk and benefits of these types of injections.  I would like her to get an appoint with Dr. Prince Rome in the next 2 to 3 weeks for him to provide an ultrasound-guided intra-articular injection of a steroid in her left hip joint.  After that follow-up can be as needed and we can always see her back if things flareup on her.  Follow-Up Instructions: Return if symptoms worsen or fail to improve.   Orders:  Orders Placed This Encounter  Procedures  . Large Joint Inj   No orders of the defined types were placed in this encounter.     Procedures: Large Joint Inj: R knee on 03/04/2021 10:35 AM Indications: diagnostic evaluation and pain Details: 22 G 1.5 in needle, superolateral approach  Arthrogram: No  Medications: 3 mL lidocaine 1 %; 40 mg methylPREDNISolone acetate 40 MG/ML Outcome: tolerated well, no immediate complications Procedure, treatment alternatives, risks and benefits explained, specific risks discussed. Consent was given by the patient. Immediately prior to procedure a time out was called to verify the correct patient, procedure, equipment, support staff and site/side marked as required. Patient was prepped and draped in the usual sterile fashion.       Clinical Data: No additional findings.   Subjective: Chief Complaint  Patient presents with  . Right Knee - Follow-up  The patient comes in today requesting a steroid injection in her right knee.  She does have known osteoarthritis in that right knee  and chronic knee pain.  She has not had any recent injuries to that knee.  I have actually replaced her left knee and her right hip.  She has not had a steroid injection in a long period of time for her knee.  She is only borderline diabetic.  She is requesting a steroid injection again today in the right knee.  The last time we x-rayed her hip was several years ago and her right hip replacement look good in the left hip still showed a well-maintained joint space.  She has had a remote history of an intra-articular injection in that left hip.  She is requesting setting her up for another intra-articular injection left hip.  We have injected her trochanteric area on the left side before as well.  HPI  Review of Systems She currently denies any headache, chest pain, shortness of breath, fever, chills, nausea, vomiting  Objective: Vital Signs: There were no vitals taken for this visit.  Physical Exam She is alert and orient x3 and in no acute distress Ortho Exam Examination of her left hip does show some pain with internal and external rotation of the groin but no blocks to rotation.  There is tenderness over the trochanteric area of the left hip.  Her right knee shows some varus malalignment and patellofemoral crepitation with global tenderness. Specialty Comments:  No specialty comments available.  Imaging: No results found.   PMFS History: Patient Active Problem List   Diagnosis Date Noted  . 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 Status post revision of total replacement of left knee 09/15/2018  . Trochanteric bursitis, left hip 08/10/2017  . Osteoarthritis of right hip 07/22/2015  . 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 Acute blood loss anemia 01/04/2013  . Osteoarthritis of left knee 01/01/2013    Class: Chronic   Past Medical History:  Diagnosis Date  . Arthritis    "left knee" (01/01/2013)  . Diabetes mellitus without  complication (HCC)    borderline no med  . DVT of lower extremity (deep venous thrombosis) (HCC) 2007   "LLE; from birth control pill" (01/01/2013)  . GERD (gastroesophageal reflux disease)   . Headache    pt. states migraines at times  . Hypercholesteremia    Crestor  . Hypertension     Family History  Problem Relation Age of Onset  . Breast cancer Neg Hx     Past Surgical History:  Procedure Laterality Date  . ANTERIOR CERVICAL DECOMP/DISCECTOMY FUSION  2006  . CARPAL TUNNEL RELEASE  2000   "right" (01/01/2013)  . CESAREAN SECTION  1978  . COLONOSCOPY    . ESOPHAGOGASTRODUODENOSCOPY    . I & D KNEE WITH POLY EXCHANGE Left 09/15/2018   Procedure: POLY EXCHANGE LEFT KNEE,/,PATELLAR COMPONENT REVISION  STEROID INJECTION LEFT HIP;  Surgeon: Kathryne Hitch, MD;  Location: [facility];  Service: Orthopedics;  Laterality: Left;  . KIDNEY DONATION  1994  . KNEE ARTHROPLASTY  01/01/2013   Procedure: COMPUTER ASSISTED TOTAL KNEE ARTHROPLASTY;  Surgeon: Kerrin Champagne, MD;  Location: [facility];  Service: Orthopedics;  Laterality: Left;  Left computer assisted total knee replacement  . LIPOMA EXCISION  2000   back  . REPLACEMENT TOTAL KNEE  01/01/2013   "left" (01/01/2013)  . TOTAL HIP ARTHROPLASTY Right 07/22/2015   Procedure: RIGHT TOTAL HIP ARTHROPLASTY ANTERIOR APPROACH;  Surgeon: Kathryne Hitch, MD;  Location: [facility];  Service: Orthopedics;  Laterality: Right;   Social History   Occupational History  . Not on file  Tobacco Use  . Smoking status: Never Smoker  . Smokeless tobacco: Never Used  Vaping Use  . Vaping Use: Never used  Substance and Sexual Activity  . Alcohol use: No  . Drug use: No  . Sexual activity: Yes

## 2021-03-04 NOTE — Telephone Encounter (Signed)
Patient called. She says she is out of Tramadol. Would like a refill called in for her. Her call back number is [phone]

## 2021-03-18 ENCOUNTER — Other Ambulatory Visit: Payer: Self-pay

## 2021-03-18 ENCOUNTER — Ambulatory Visit (INDEPENDENT_AMBULATORY_CARE_PROVIDER_SITE_OTHER): Payer: Medicare Other | Admitting: Family Medicine

## 2021-03-18 ENCOUNTER — Encounter: Payer: Self-pay | Admitting: Family Medicine

## 2021-03-18 ENCOUNTER — Ambulatory Visit: Payer: Self-pay

## 2021-03-18 DIAGNOSIS — M25552 Pain in left hip: Secondary | ICD-10-CM

## 2021-03-18 NOTE — Progress Notes (Signed)
Subjective: Patient is here for ultrasound-guided intra-articular left hip injection.   Pain from DJD.  Objective:  Pain with passive IR.  Procedure: Ultrasound guided injection is preferred based studies that show increased duration, increased effect, greater accuracy, decreased procedural pain, increased response rate, and decreased cost with ultrasound guided versus blind injection.   Verbal informed consent obtained.  Time-out conducted.  Noted no overlying erythema, induration, or other signs of local infection. Ultrasound-guided left hip injection: After sterile prep with Betadine, injected 4 cc 0.25% bupivacaine without epinephrine and 6 mg betamethasone using a 22-gauge spinal needle, passing the needle through the iliofemoral ligament into the femoral head/neck junction.  Injectate seen filling joint capsule.  Good immediate relief.

## 2021-05-14 ENCOUNTER — Other Ambulatory Visit: Payer: Self-pay | Admitting: Family Medicine

## 2021-05-14 ENCOUNTER — Other Ambulatory Visit: Payer: Self-pay

## 2021-05-14 DIAGNOSIS — Z1231 Encounter for screening mammogram for malignant neoplasm of breast: Secondary | ICD-10-CM

## 2021-06-03 ENCOUNTER — Ambulatory Visit (INDEPENDENT_AMBULATORY_CARE_PROVIDER_SITE_OTHER): Payer: Medicare Other | Admitting: Orthopaedic Surgery

## 2021-06-03 ENCOUNTER — Encounter: Payer: Self-pay | Admitting: Orthopaedic Surgery

## 2021-06-03 DIAGNOSIS — H5789 Other specified disorders of eye and adnexa: Secondary | ICD-10-CM

## 2021-06-03 DIAGNOSIS — M7062 Trochanteric bursitis, left hip: Secondary | ICD-10-CM | POA: Diagnosis not present

## 2021-06-03 DIAGNOSIS — H5712 Ocular pain, left eye: Secondary | ICD-10-CM

## 2021-06-03 MED ORDER — METHYLPREDNISOLONE ACETATE 40 MG/ML IJ SUSP
40.0000 mg | INTRAMUSCULAR | Status: AC | PRN
  Administered 2021-06-03: 40 mg via INTRA_ARTICULAR

## 2021-06-03 MED ORDER — LIDOCAINE HCL 1 % IJ SOLN
3.0000 mL | INTRAMUSCULAR | Status: AC | PRN
  Administered 2021-06-03: 3 mL

## 2021-06-03 NOTE — Progress Notes (Signed)
Office Visit Note   Patient: Diana Bowers           Date of Birth: [birth year]           MRN: [id number] Visit Date: 06/03/2021              Requested by: Caffie Damme, MD [address] [place],  [place] [address] PCP: Caffie Damme, MD   Assessment & Plan: Visit Diagnoses:  1. Trochanteric bursitis, left hip     Plan: Based on her exam today of her left hip and her review of x-rays, she does not have an severe arthritis in her left hip and I do not think a hip replacement is warranted based on her clinical exam and x-ray findings.  I did provide a steroid injection of the trochanteric area which I think will help her and she said this is helped her significantly past.  I have again spoke to her about weight loss.  She may benefit from outpatient physical therapy.  We will least order basic rheumatologic labs today to see if that sheds light on anything.  I told her to bring this up to her primary care physician as well when she sees her next month.  All questions and concerns were answered addressed.  I did inject her left hip trochanteric area which she tolerated well.  I will see her back in 4 weeks.  I will call her if there is any issues with her labs.  Follow-Up Instructions: Return in about 4 weeks (around 07/01/2021).   Orders:  No orders of the defined types were placed in this encounter.  No orders of the defined types were placed in this encounter.     Procedures: Large Joint Inj: L greater trochanter on 06/03/2021 9:41 AM Indications: pain and diagnostic evaluation Details: 22 G 1.5 in needle, lateral approach  Arthrogram: No  Medications: 3 mL lidocaine 1 %; 40 mg methylPREDNISolone acetate 40 MG/ML Outcome: tolerated well, no immediate complications Procedure, treatment alternatives, risks and benefits explained, specific risks discussed. Consent was given by the patient. Immediately prior to procedure a time out was called to verify the correct patient, procedure,  equipment, support staff and site/side marked as required. Patient was prepped and draped in the usual sterile fashion.       Clinical Data: No additional findings.   Subjective: Chief Complaint  Patient presents with  . Left Hip - Follow-up  The patient is well-known to me.  I have treated her for right hip osteoarthritis and left knee osteoarthritis with joint replacements.  She has been having some left hip pain is been over the trochanteric area and have injected that area in the past over 3 months ago and that helped.  We sent her for an intra-articular steroid injection left hip joint by Dr. Prince Rome under ultrasound and that worked minimally.  Most of her pain there was on the lateral aspect of her hip and down to the IT band area.  There is really no groin pain.  She would certainly benefit from weight loss as well.  She has been dealing with some significant left eye issues for the last 2 months.  Her eye doctor feels this may be a rheumatologic issue.  HPI  Review of Systems She currently denies any headache, chest pain, shortness of breath, fever, chills, nausea, vomiting.  Of note if she is a borderline diabetic.  Objective: Vital Signs: There were no vitals taken for this visit.  Physical  Exam She is alert and orient x3 and in no acute distress Ortho Exam On my examination today her left hip moves smoothly and fluidly.  Her pain is quite significant palpation over the left trochanteric area and the IT band suggesting trochanteric bursitis and IT band syndrome. Specialty Comments:  No specialty comments available.  Imaging: No results found.   PMFS History: Patient Active Problem List   Diagnosis Date Noted  . 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 Status post revision of total replacement of left knee 09/15/2018  . Trochanteric bursitis, left hip 08/10/2017  . Osteoarthritis of right hip 07/22/2015  . Status post total replacement of  right hip 07/22/2015  . Acute blood loss anemia 01/04/2013  . Osteoarthritis of left knee 01/01/2013    Class: Chronic   Past Medical History:  Diagnosis Date  . Arthritis    "left knee" (01/01/2013)  . Diabetes mellitus without complication (HCC)    borderline no med  . DVT of lower extremity (deep venous thrombosis) (HCC) 2007   "LLE; from birth control pill" (01/01/2013)  . GERD (gastroesophageal reflux disease)   . Headache    pt. states migraines at times  . Hypercholesteremia    Crestor  . Hypertension     Family History  Problem Relation Age of Onset  . Breast cancer Neg Hx     Past Surgical History:  Procedure Laterality Date  . ANTERIOR CERVICAL DECOMP/DISCECTOMY FUSION  2006  . CARPAL TUNNEL RELEASE  2000   "right" (01/01/2013)  . CESAREAN SECTION  1978  . COLONOSCOPY    . ESOPHAGOGASTRODUODENOSCOPY    . I & D KNEE WITH POLY EXCHANGE Left 09/15/2018   Procedure: POLY EXCHANGE LEFT KNEE,/,PATELLAR COMPONENT REVISION  STEROID INJECTION LEFT HIP;  Surgeon: Kathryne Hitch, MD;  Location: [facility];  Service: Orthopedics;  Laterality: Left;  . KIDNEY DONATION  1994  . KNEE ARTHROPLASTY  01/01/2013   Procedure: COMPUTER ASSISTED TOTAL KNEE ARTHROPLASTY;  Surgeon: Kerrin Champagne, MD;  Location: [facility];  Service: Orthopedics;  Laterality: Left;  Left computer assisted total knee replacement  . LIPOMA EXCISION  2000   back  . REPLACEMENT TOTAL KNEE  01/01/2013   "left" (01/01/2013)  . TOTAL HIP ARTHROPLASTY Right 07/22/2015   Procedure: RIGHT TOTAL HIP ARTHROPLASTY ANTERIOR APPROACH;  Surgeon: Kathryne Hitch, MD;  Location: [facility] OR;  Service: Orthopedics;  Laterality: Right;   Social History   Occupational History  . Not on file  Tobacco Use  . Smoking status: Never Smoker  . Smokeless tobacco: Never Used  Vaping Use  . Vaping Use: Never used  Substance and Sexual Activity  . Alcohol use: No  . Drug use: No  . Sexual activity: Yes

## 2021-06-03 NOTE — Addendum Note (Signed)
Addended by: Shonna Chock on: 06/03/2021 11:08 AM   Modules accepted: Orders

## 2021-06-05 LAB — URIC ACID: Uric Acid, Serum: 4.6 mg/dL (ref 2.5–7.0)

## 2021-06-05 LAB — ANA: Anti Nuclear Antibody (ANA): NEGATIVE

## 2021-06-05 LAB — RHEUMATOID FACTOR: Rheumatoid fact SerPl-aCnc: <14 IU/mL (ref <14)

## 2021-06-05 LAB — SEDIMENTATION RATE: Sed Rate: 14 mm/h (ref 0–30)

## 2021-06-08 ENCOUNTER — Telehealth: Payer: Self-pay

## 2021-06-08 ENCOUNTER — Other Ambulatory Visit: Payer: Self-pay | Admitting: Orthopaedic Surgery

## 2021-06-08 MED ORDER — TRAMADOL HCL 50 MG PO TABS: 50.0000 mg | ORAL_TABLET | Freq: Three times a day (TID) | ORAL | 0 refills | Status: DC | PRN

## 2021-06-08 NOTE — Telephone Encounter (Signed)
Pt called and would like a refill on tramadol  

## 2021-07-01 ENCOUNTER — Encounter: Payer: Self-pay | Admitting: Orthopaedic Surgery

## 2021-07-01 ENCOUNTER — Ambulatory Visit (INDEPENDENT_AMBULATORY_CARE_PROVIDER_SITE_OTHER): Payer: Medicare Other | Admitting: Orthopaedic Surgery

## 2021-07-01 ENCOUNTER — Other Ambulatory Visit: Payer: Self-pay

## 2021-07-01 DIAGNOSIS — G8929 Other chronic pain: Secondary | ICD-10-CM

## 2021-07-01 DIAGNOSIS — M1711 Unilateral primary osteoarthritis, right knee: Secondary | ICD-10-CM

## 2021-07-01 DIAGNOSIS — M25561 Pain in right knee: Secondary | ICD-10-CM

## 2021-07-01 MED ORDER — LIDOCAINE HCL 1 % IJ SOLN
3.0000 mL | INTRAMUSCULAR | Status: AC | PRN
  Administered 2021-07-01: 3 mL

## 2021-07-01 MED ORDER — METHYLPREDNISOLONE ACETATE 40 MG/ML IJ SUSP
40.0000 mg | INTRAMUSCULAR | Status: AC | PRN
  Administered 2021-07-01: 40 mg via INTRA_ARTICULAR

## 2021-07-01 NOTE — Progress Notes (Signed)
Office Visit Note   Patient: Diana Bowers           Date of Birth: 02/18/1951           MRN: [id number] Visit Date: 07/01/2021              Requested by: Caffie Damme, MD [address] [place],  [place] [address] PCP: Caffie Damme, MD   Assessment & Plan: Visit Diagnoses:  1. Chronic pain of right knee   2. Primary osteoarthritis of right knee     Plan: Per her request, I did provide a steroid injection in her right knee joint which she tolerated well.  All questions and concerns were answered addressed.  Follow-up can be as needed.  If she comes in again with continued right knee pain, it would warrant new x-rays of the right knee as well as a weight and BMI calculation for if she wants to consider knee replacement surgery in the future for her right knee.  Follow-Up Instructions: Return if symptoms worsen or fail to improve.   Orders:  Orders Placed This Encounter  Procedures   Large Joint Inj    No orders of the defined types were placed in this encounter.     Procedures: Large Joint Inj: R knee on 07/01/2021 10:49 AM Indications: diagnostic evaluation and pain Details: 22 G 1.5 in needle, superolateral approach  Arthrogram: No  Medications: 3 mL lidocaine 1 %; 40 mg methylPREDNISolone acetate 40 MG/ML Outcome: tolerated well, no immediate complications Procedure, treatment alternatives, risks and benefits explained, specific risks discussed. Consent was given by the patient. Immediately prior to procedure a time out was called to verify the correct patient, procedure, equipment, support staff and site/side marked as required. Patient was prepped and draped in the usual sterile fashion.      Clinical Data: No additional findings.   Subjective: Chief Complaint  Patient presents with   Left Hip - Follow-up  The patient was mainly coming in today for follow-up for her left hip bursitis.  She says her left hip is done much better overall.  She does have a remote  history of a left knee replacement and a polyliner exchange.  Her right knee has known osteoarthritis of the right knee and it is bothering her quite a bit.  She is requesting a steroid injection in her right knee today.  She has had no other acute change in her medical status and again her left hip is improved significantly.  HPI  Review of Systems There is currently listed no headache, chest pain, short of breath, fever, chills, nausea, vomiting  Objective: Vital Signs: There were no vitals taken for this visit.  Physical Exam She is alert and orient x3 and in no acute distress.  She is not walking with an assistive device. Ortho Exam Her right knee has slight varus malalignment and global tenderness throughout arc of motion with patellofemoral crepitation as well.  It is ligamentously stable but painful along the medial joint line and the patellofemoral joint.  Her left hip has much less pain and move smoothly and fluidly. Specialty Comments:  No specialty comments available.  Imaging: No results found.   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   Status post revision of total replacement of left knee 09/15/2018   Trochanteric bursitis, left hip 08/10/2017   Osteoarthritis of right hip 07/22/2015   Status  post total replacement of right hip 07/22/2015   Acute blood loss anemia 01/04/2013   Osteoarthritis of left knee 01/01/2013    Class: Chronic   Past Medical History:  Diagnosis Date   Arthritis    "left knee" (01/01/2013)   Diabetes mellitus without complication (HCC)    borderline no med   DVT of lower extremity (deep venous thrombosis) (HCC) 2007   "LLE; from birth control pill" (01/01/2013)   GERD (gastroesophageal reflux disease)    Headache    pt. states migraines at times   Hypercholesteremia    Crestor   Hypertension     Family History  Problem Relation Age of Onset    Breast cancer Neg Hx     Past Surgical History:  Procedure Laterality Date   ANTERIOR CERVICAL DECOMP/DISCECTOMY FUSION  2006   CARPAL TUNNEL RELEASE  2000   "right" (01/01/2013)   CESAREAN SECTION  1978   COLONOSCOPY     ESOPHAGOGASTRODUODENOSCOPY     I & D KNEE WITH POLY EXCHANGE Left 09/15/2018   Procedure: POLY EXCHANGE LEFT KNEE,/,PATELLAR COMPONENT REVISION  STEROID INJECTION LEFT HIP;  Surgeon: Kathryne Hitch, MD;  Location: [facility];  Service: Orthopedics;  Laterality: Left;   KIDNEY DONATION  1994   KNEE ARTHROPLASTY  01/01/2013   Procedure: COMPUTER ASSISTED TOTAL KNEE ARTHROPLASTY;  Surgeon: Kerrin Champagne, MD;  Location: [facility];  Service: Orthopedics;  Laterality: Left;  Left computer assisted total knee replacement   LIPOMA EXCISION  2000   back   REPLACEMENT TOTAL KNEE  01/01/2013   "left" (01/01/2013)   TOTAL HIP ARTHROPLASTY Right 07/22/2015   Procedure: RIGHT TOTAL HIP ARTHROPLASTY ANTERIOR APPROACH;  Surgeon: Kathryne Hitch, MD;  Location: [facility];  Service: Orthopedics;  Laterality: Right;   Social History   Occupational History   Not on file  Tobacco Use   Smoking status: Never   Smokeless tobacco: Never  Vaping Use   Vaping Use: Never used  Substance and Sexual Activity   Alcohol use: No   Drug use: No   Sexual activity: Yes

## 2021-07-10 ENCOUNTER — Other Ambulatory Visit: Payer: Self-pay

## 2021-07-10 ENCOUNTER — Ambulatory Visit
Admission: RE | Admit: 2021-07-10 | Discharge: 2021-07-10 | Disposition: A | Discharge status: Home / Self Care | Payer: Medicare Other | Source: Ambulatory Visit | Attending: Family Medicine | Admitting: Family Medicine

## 2021-07-10 DIAGNOSIS — Z1231 Encounter for screening mammogram for malignant neoplasm of breast: Secondary | ICD-10-CM

## 2021-09-14 ENCOUNTER — Telehealth: Payer: Self-pay | Admitting: Orthopaedic Surgery

## 2021-09-14 ENCOUNTER — Other Ambulatory Visit: Payer: Self-pay | Admitting: Orthopaedic Surgery

## 2021-09-14 MED ORDER — TRAMADOL HCL 50 MG PO TABS: 50.0000 mg | ORAL_TABLET | Freq: Three times a day (TID) | ORAL | 0 refills | Status: DC | PRN

## 2021-09-14 NOTE — Telephone Encounter (Signed)
Pt calling asking for a refill on her tramadol prescription. The best pharmacy is the one on file and the best call back number is [phone].

## 2021-11-30 ENCOUNTER — Ambulatory Visit (INDEPENDENT_AMBULATORY_CARE_PROVIDER_SITE_OTHER): Payer: Medicare Other | Admitting: Orthopaedic Surgery

## 2021-11-30 DIAGNOSIS — M7062 Trochanteric bursitis, left hip: Secondary | ICD-10-CM

## 2021-11-30 MED ORDER — METHYLPREDNISOLONE ACETATE 40 MG/ML IJ SUSP
40.0000 mg | INTRAMUSCULAR | Status: AC | PRN
  Administered 2021-11-30: 40 mg via INTRA_ARTICULAR

## 2021-11-30 MED ORDER — LIDOCAINE HCL 1 % IJ SOLN
3.0000 mL | INTRAMUSCULAR | Status: AC | PRN
  Administered 2021-11-30: 3 mL

## 2021-11-30 MED ORDER — TRAMADOL HCL 50 MG PO TABS: 50.0000 mg | ORAL_TABLET | Freq: Three times a day (TID) | ORAL | 0 refills | Status: DC | PRN

## 2021-11-30 NOTE — Progress Notes (Signed)
Office Visit Note   Patient: Diana Bowers           Date of Birth: [birth year]           MRN: [id number] Visit Date: 11/30/2021              Requested by: Caffie Damme, MD [address] [place],  [place] [address] PCP: Caffie Damme, MD   Assessment & Plan: Visit Diagnoses:  1. Trochanteric bursitis, left hip     Plan: Per the patient's request I did provide a steroid injection over the left hip trochanteric area.  She tolerated this well.  She does understand the risk and benefits of injections.  Follow-up will be as needed.  I will send in some tramadol to take as needed.  Follow-Up Instructions: Return if symptoms worsen or fail to improve.   Orders:  Orders Placed This Encounter  Procedures   Large Joint Inj   Meds ordered this encounter  Medications   traMADol (ULTRAM) 50 MG tablet    Sig: Take 1-2 tablets (50-100 mg total) by mouth 3 (three) times daily as needed.    Dispense:  60 tablet    Refill:  0       Procedures: Large Joint Inj: L greater trochanter on 11/30/2021 9:20 AM Indications: pain and diagnostic evaluation Details: 22 G 1.5 in needle, lateral approach  Arthrogram: No  Medications: 3 mL lidocaine 1 %; 40 mg methylPREDNISolone acetate 40 MG/ML Outcome: tolerated well, no immediate complications Procedure, treatment alternatives, risks and benefits explained, specific risks discussed. Consent was given by the patient. Immediately prior to procedure a time out was called to verify the correct patient, procedure, equipment, support staff and site/side marked as required. Patient was prepped and draped in the usual sterile fashion.      Clinical Data: No additional findings.   Subjective: Chief Complaint  Patient presents with   Left Hip - Pain  The patient comes in today with left hip trochanteric bursitis.  We have seen her for this before and provided a steroid injection of the trochanteric area.  She is a very active 70 year old female.  It  has been a while since we have done this in the past.  She has had no acute change in her medical status.  She denies any groin pain.  She only points to the lateral aspect of her left hip as source of her pain.  She is also requesting refill of tramadol.  HPI  Review of Systems There is currently listed no headache, chest pain, shortness of breath, fever, chills, nausea, vomiting  Objective: Vital Signs: There were no vitals taken for this visit.  Physical Exam She is alert and orient x3 and in no acute distress Ortho Exam Examination of her left hip shows only pain to palpation of the trochanteric area.  Range of motion is full and normal. Specialty Comments:  No specialty comments available.  Imaging: No results found.   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   Status post revision of total replacement of left knee 09/15/2018   Trochanteric bursitis, left hip 08/10/2017   Osteoarthritis of right hip 07/22/2015   Status post total replacement of right hip 07/22/2015   Acute blood loss anemia 01/04/2013   Osteoarthritis of left knee 01/01/2013    Class: Chronic   Past Medical History:  Diagnosis Date   Arthritis    "  left knee" (01/01/2013)   Diabetes mellitus without complication (HCC)    borderline no med   DVT of lower extremity (deep venous thrombosis) (HCC) 2007   "LLE; from birth control pill" (01/01/2013)   GERD (gastroesophageal reflux disease)    Headache    pt. states migraines at times   Hypercholesteremia    Crestor   Hypertension     Family History  Problem Relation Age of Onset   Breast cancer Neg Hx     Past Surgical History:  Procedure Laterality Date   ANTERIOR CERVICAL DECOMP/DISCECTOMY FUSION  2006   CARPAL TUNNEL RELEASE  2000   "right" (01/01/2013)   CESAREAN SECTION  1978   COLONOSCOPY     ESOPHAGOGASTRODUODENOSCOPY     I & D KNEE WITH POLY EXCHANGE  Left 09/15/2018   Procedure: POLY EXCHANGE LEFT KNEE,/,PATELLAR COMPONENT REVISION  STEROID INJECTION LEFT HIP;  Surgeon: Kathryne Hitch, MD;  Location: [facility];  Service: Orthopedics;  Laterality: Left;   KIDNEY DONATION  1994   KNEE ARTHROPLASTY  01/01/2013   Procedure: COMPUTER ASSISTED TOTAL KNEE ARTHROPLASTY;  Surgeon: Kerrin Champagne, MD;  Location: [facility];  Service: Orthopedics;  Laterality: Left;  Left computer assisted total knee replacement   LIPOMA EXCISION  2000   back   REPLACEMENT TOTAL KNEE  01/01/2013   "left" (01/01/2013)   TOTAL HIP ARTHROPLASTY Right 07/22/2015   Procedure: RIGHT TOTAL HIP ARTHROPLASTY ANTERIOR APPROACH;  Surgeon: Kathryne Hitch, MD;  Location: [facility];  Service: Orthopedics;  Laterality: Right;   Social History   Occupational History   Not on file  Tobacco Use   Smoking status: Never   Smokeless tobacco: Never  Vaping Use   Vaping Use: Never used  Substance and Sexual Activity   Alcohol use: No   Drug use: No   Sexual activity: Yes

## 2021-12-02 ENCOUNTER — Telehealth: Payer: Self-pay | Admitting: Orthopaedic Surgery

## 2021-12-02 ENCOUNTER — Other Ambulatory Visit: Payer: Self-pay | Admitting: Orthopaedic Surgery

## 2021-12-02 MED ORDER — TRAMADOL HCL 50 MG PO TABS: 50.0000 mg | ORAL_TABLET | Freq: Three times a day (TID) | ORAL | 0 refills | Status: DC | PRN

## 2021-12-02 NOTE — Telephone Encounter (Signed)
Pt called and states Dr.Blackman was suppose to give her a script for tramadol.   CB [phone]

## 2021-12-02 NOTE — Telephone Encounter (Signed)
Called and advised pt.

## 2021-12-02 NOTE — Telephone Encounter (Signed)
Please advise 

## 2022-03-18 ENCOUNTER — Ambulatory Visit (INDEPENDENT_AMBULATORY_CARE_PROVIDER_SITE_OTHER): Payer: Medicare Other | Admitting: Physician Assistant

## 2022-03-18 ENCOUNTER — Other Ambulatory Visit: Payer: Self-pay | Admitting: Physician Assistant

## 2022-03-18 ENCOUNTER — Telehealth: Payer: Self-pay | Admitting: Physician Assistant

## 2022-03-18 ENCOUNTER — Ambulatory Visit (INDEPENDENT_AMBULATORY_CARE_PROVIDER_SITE_OTHER): Payer: Medicare Other

## 2022-03-18 ENCOUNTER — Encounter: Payer: Self-pay | Admitting: Physician Assistant

## 2022-03-18 VITALS — Ht 63.0 in | Wt 220.8 lb

## 2022-03-18 DIAGNOSIS — M1711 Unilateral primary osteoarthritis, right knee: Secondary | ICD-10-CM

## 2022-03-18 DIAGNOSIS — M25561 Pain in right knee: Secondary | ICD-10-CM

## 2022-03-18 MED ORDER — METHYLPREDNISOLONE ACETATE 40 MG/ML IJ SUSP
40.0000 mg | INTRAMUSCULAR | Status: AC | PRN
  Administered 2022-03-18: 40 mg via INTRA_ARTICULAR

## 2022-03-18 MED ORDER — LIDOCAINE HCL 1 % IJ SOLN
3.0000 mL | INTRAMUSCULAR | Status: AC | PRN
  Administered 2022-03-18: 3 mL

## 2022-03-18 MED ORDER — TRAMADOL HCL 50 MG PO TABS: 50.0000 mg | ORAL_TABLET | Freq: Three times a day (TID) | ORAL | 0 refills | Status: DC | PRN

## 2022-03-18 NOTE — Telephone Encounter (Signed)
Please advise 

## 2022-03-18 NOTE — Progress Notes (Signed)
? ?  Procedure Note ? ?Patient: Diana Bowers             ?Date of Birth: 05/30/1951           ?MRN: [id number]             ?Visit Date: 03/18/2022 ? ?HPI: Mrs. Diana Bowers comes in today requesting right knee injection.  She has known osteoarthritis of the right knee.  Last injection was 07/01/2021.  States the injection helped until few weeks ago.  She has had no new injury to the knee.  She is nondiabetic.  She states that she would like to undergo total knee replacement sometime in the fall. ? ?Review of systems: Negative for fevers chills.  Please see HPI otherwise negative ? ?Physical exam: General well-developed well-nourished female who ambulates without any assistive device. ?Right knee: Full extension full flexion.  Tenderness along medial joint line.  No instability valgus varus stressing.  No abnormal warmth erythema or effusion right knee.  Patellofemoral crepitus with passive range of motion. ? ?Radiographs: Right knee 2 views shows end-stage arthritis with bone-on-bone medial compartment.  Mild varus alignment.  Moderate to severe patellofemoral arthritic changes.  Lateral compartment is well maintained.  No acute fractures bony abnormalities. ? ?Procedures: ?Visit Diagnoses:  ?1. Primary osteoarthritis of right knee   ? ? ?Large Joint Inj: R knee on 03/18/2022 2:22 PM ?Indications: pain ?Details: 22 G 1.5 in needle, anterolateral approach ? ?Arthrogram: No ? ?Medications: 3 mL lidocaine 1 %; 40 mg methylPREDNISolone acetate 40 MG/ML ?Outcome: tolerated well, no immediate complications ?Procedure, treatment alternatives, risks and benefits explained, specific risks discussed. Consent was given by the patient. Immediately prior to procedure a time out was called to verify the correct patient, procedure, equipment, support staff and site/side marked as required. Patient was prepped and draped in the usual sterile fashion.  ? ?Plan: She knows to wait at least 3 months between injections.  She will follow-up with [place] on  an as-needed basis.  Questions were encouraged and answered at length today. ? ? ? ?

## 2022-03-18 NOTE — Telephone Encounter (Signed)
Pt had an appt and forgot to ask for a prescription of tramadol. Please send to [facility] on [address] in [place]. Pt phone number is [phone]. ?

## 2022-04-16 ENCOUNTER — Other Ambulatory Visit: Payer: Self-pay | Admitting: Orthopaedic Surgery

## 2022-04-16 ENCOUNTER — Telehealth: Payer: Self-pay | Admitting: Orthopaedic Surgery

## 2022-04-16 MED ORDER — TRAMADOL HCL 50 MG PO TABS: 50.0000 mg | ORAL_TABLET | Freq: Three times a day (TID) | ORAL | 0 refills | Status: DC | PRN

## 2022-04-16 NOTE — Telephone Encounter (Signed)
Please advise 

## 2022-04-16 NOTE — Telephone Encounter (Signed)
Patient called. She would like Tramadol called in for her pain. Her call back number is [phone] ?

## 2022-04-21 IMAGING — MG MM DIGITAL SCREENING BILAT W/ TOMO AND CAD
6 of 12 series · 6 of 36 positions shown · non-contrast
Comparison: Previous exam(s).

CLINICAL DATA: Screening.

EXAM:
DIGITAL SCREENING BILATERAL MAMMOGRAM WITH TOMOSYNTHESIS AND CAD
TECHNIQUE: Bilateral screening digital craniocaudal and mediolateral oblique
mammograms were obtained. Bilateral screening digital breast
tomosynthesis was performed. The images were evaluated with
computer-aided detection.

[R MLO synth-2D (1 of 2)]
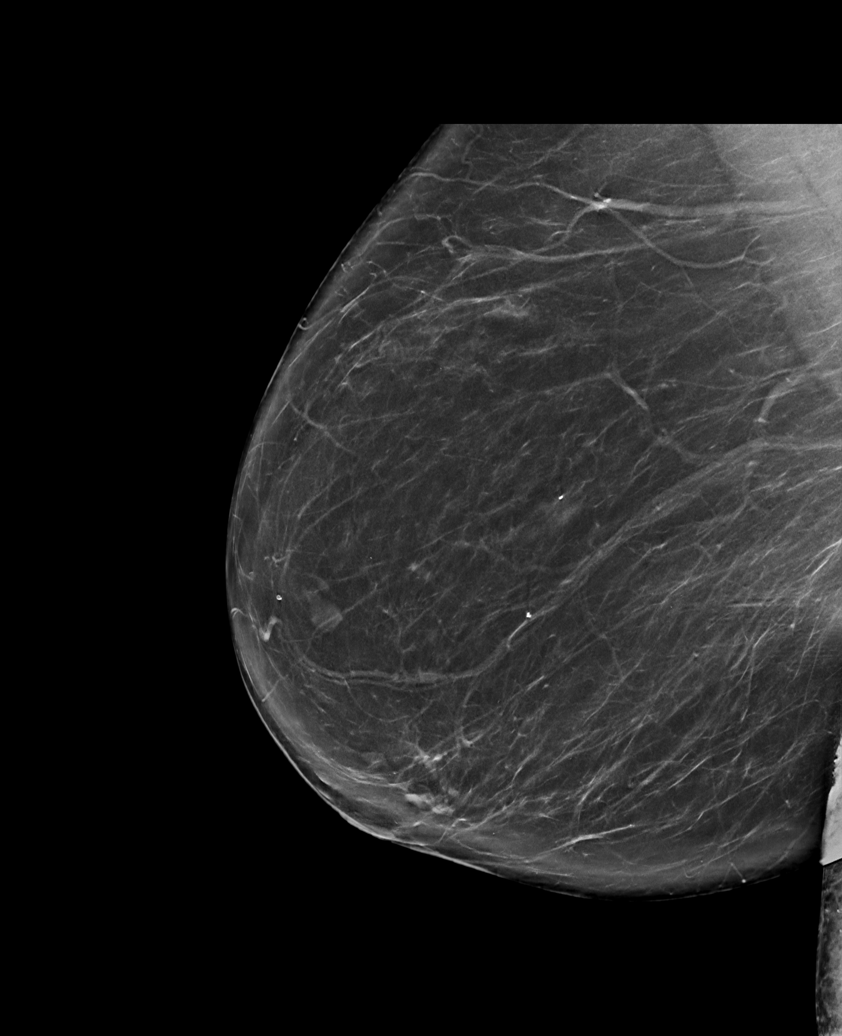

[L MLO synth-2D]
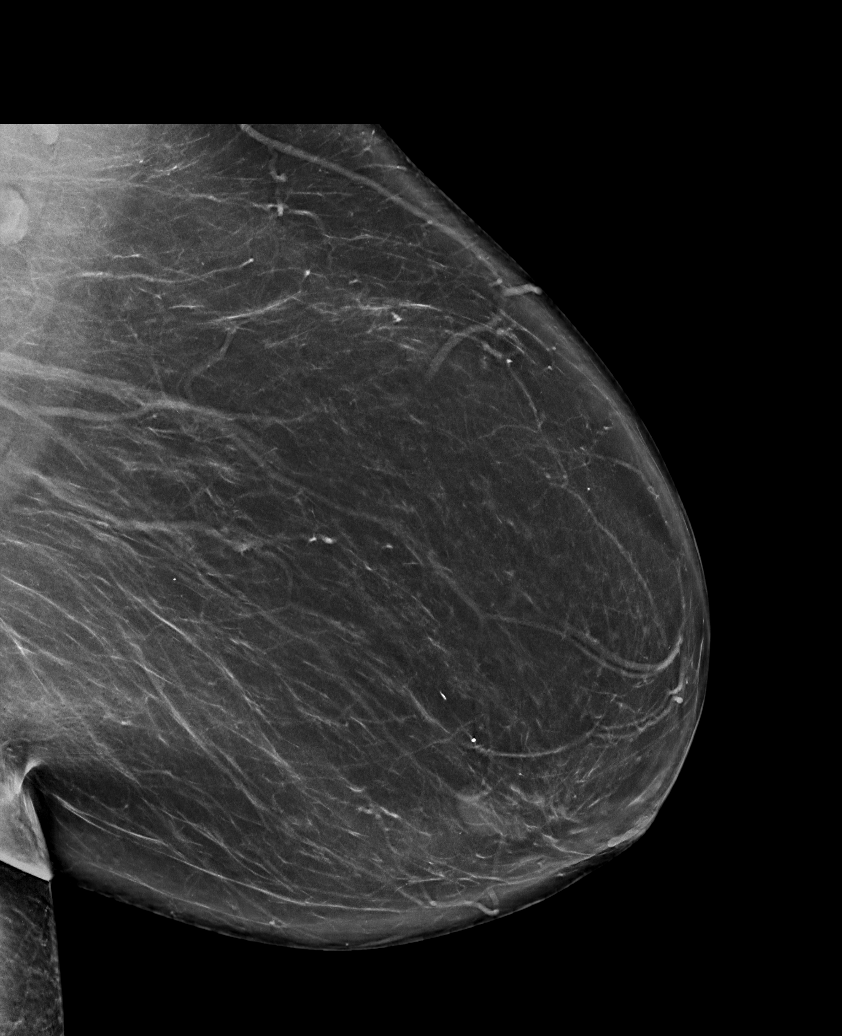

[R CC synth-2D]
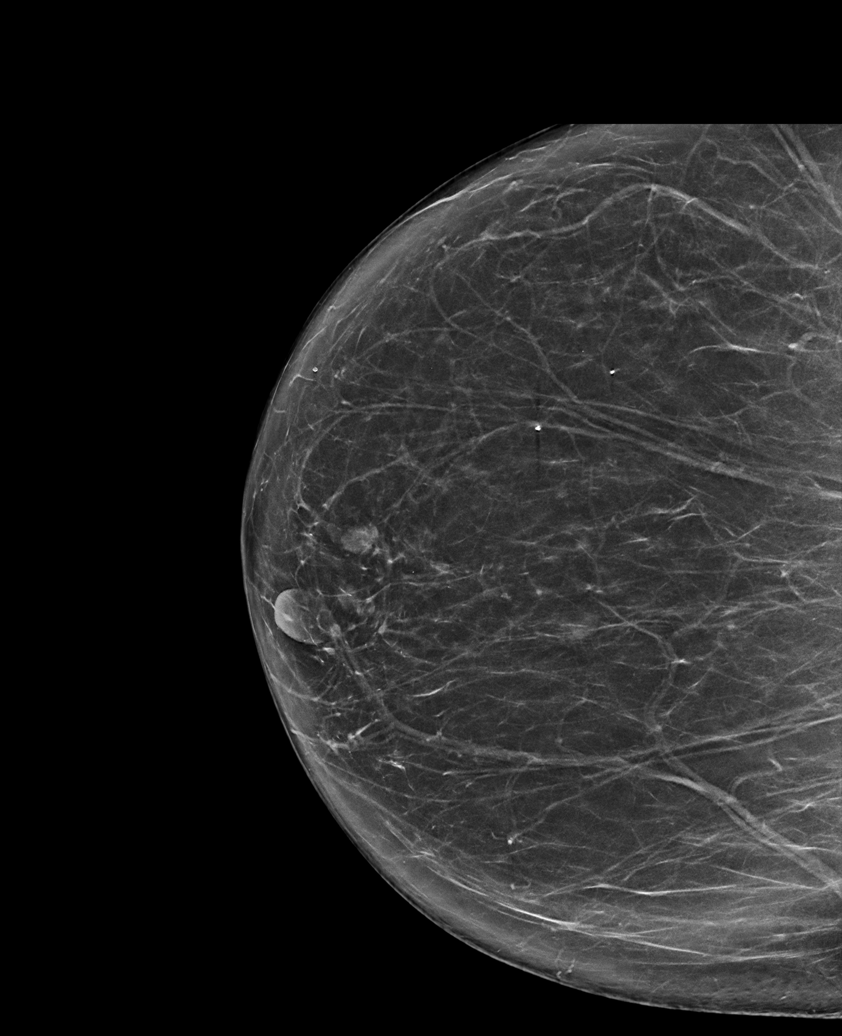

[R MLO synth-2D (2 of 2)]
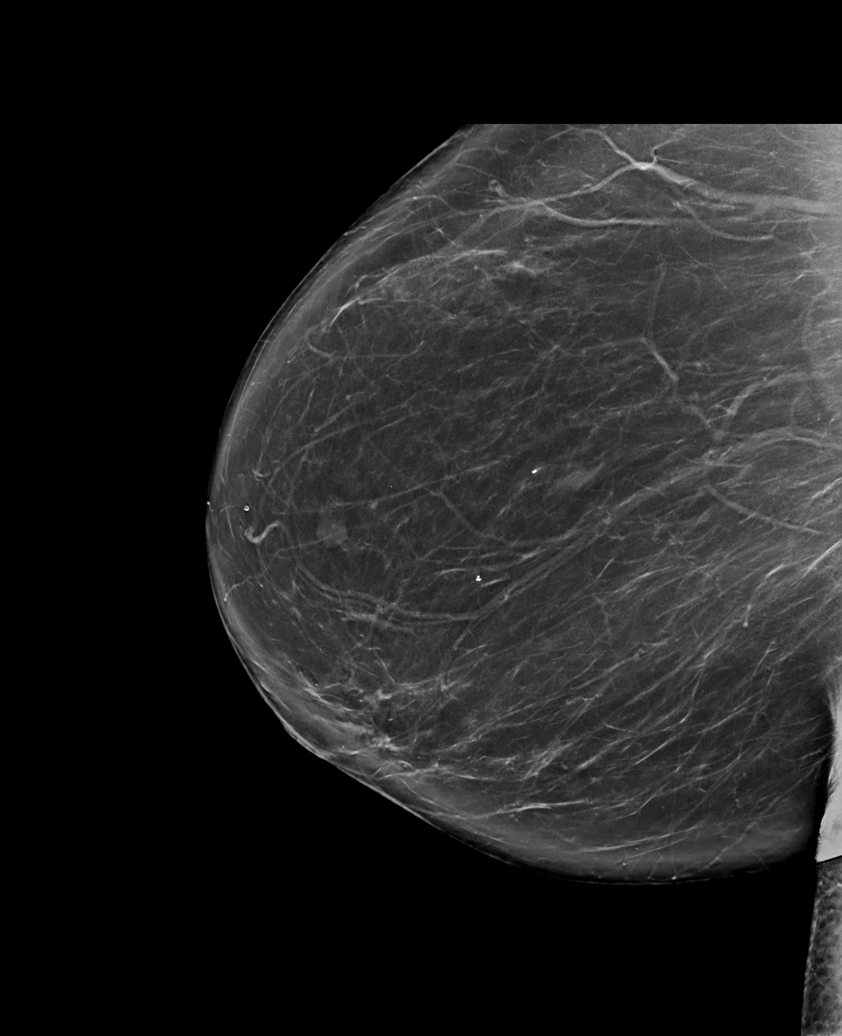

[R CV synth-2D]
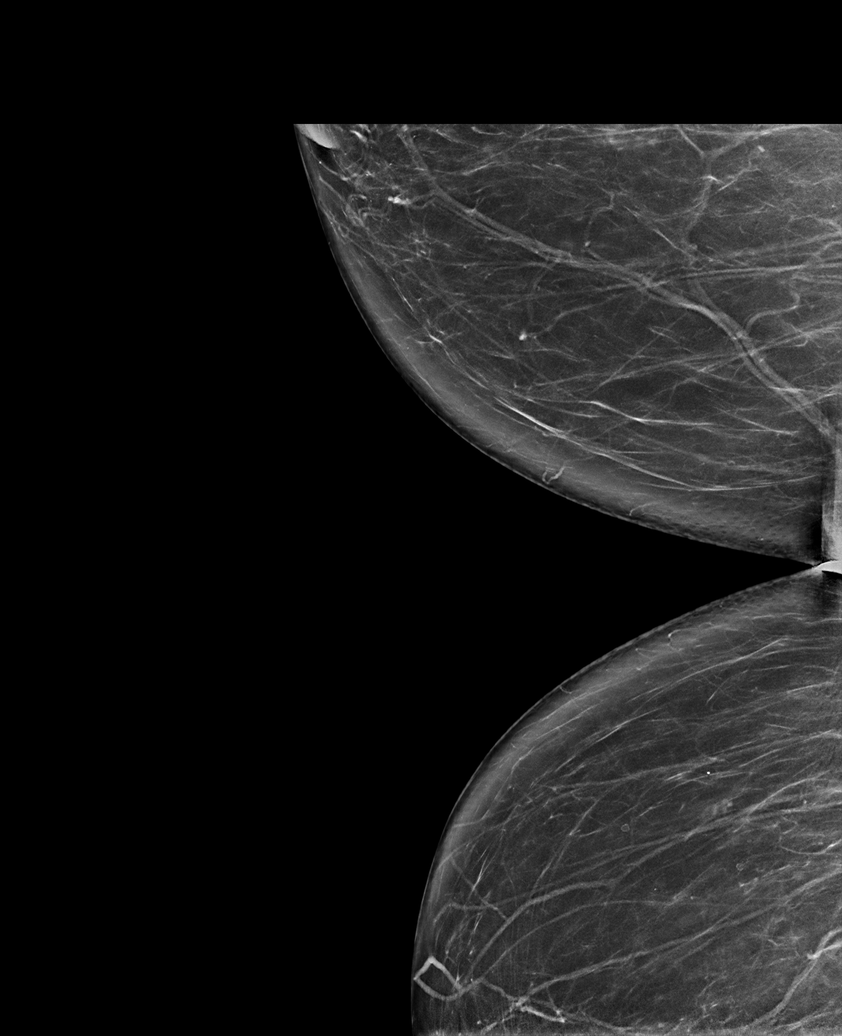

[L CC synth-2D]
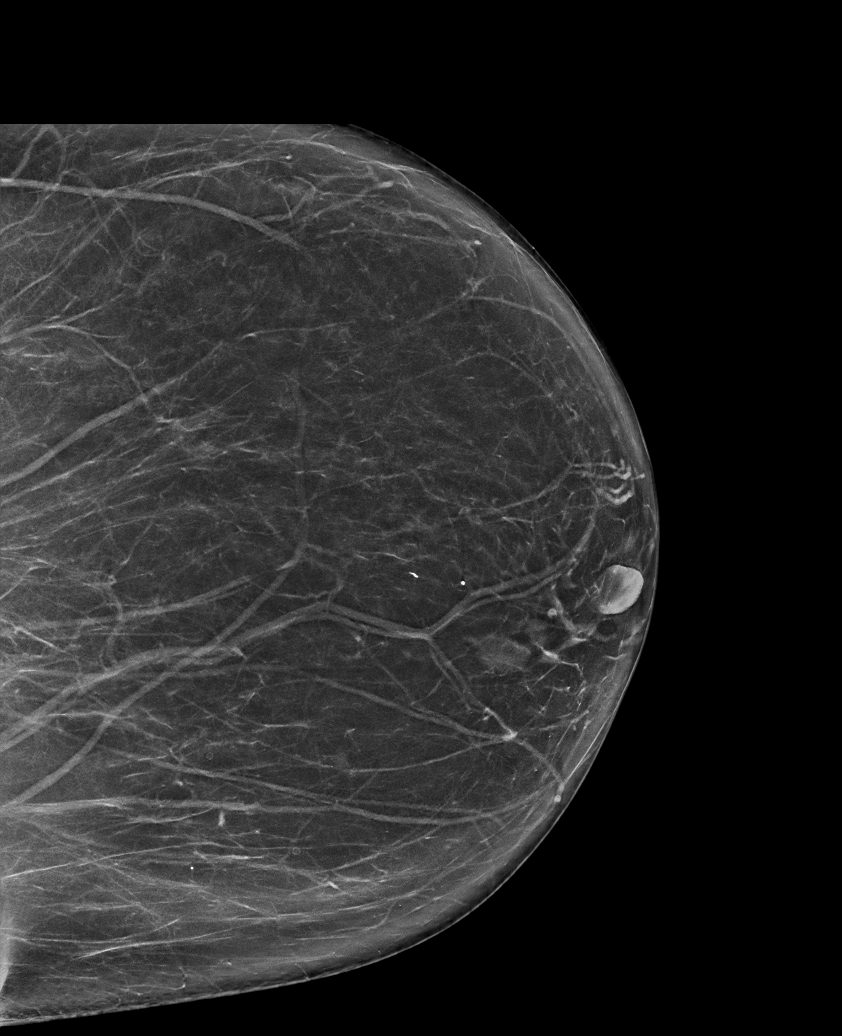

[6 of 36 positions shown; findings below may reference images not displayed]

ACR Breast Density Category b: There are scattered areas of
fibroglandular density.
FINDINGS: There are no findings suspicious for malignancy.
IMPRESSION: No mammographic evidence of malignancy. A result letter of this
screening mammogram will be mailed directly to the patient.

RECOMMENDATION:
Screening mammogram in one year. (Code:51-O-LD2)

BI-RADS CATEGORY  1: Negative.

## 2022-06-07 ENCOUNTER — Other Ambulatory Visit: Payer: Self-pay | Admitting: Family Medicine

## 2022-06-07 DIAGNOSIS — Z1231 Encounter for screening mammogram for malignant neoplasm of breast: Secondary | ICD-10-CM

## 2022-06-16 ENCOUNTER — Ambulatory Visit (INDEPENDENT_AMBULATORY_CARE_PROVIDER_SITE_OTHER): Payer: Medicare Other | Admitting: Orthopaedic Surgery

## 2022-06-16 DIAGNOSIS — G8929 Other chronic pain: Secondary | ICD-10-CM

## 2022-06-16 DIAGNOSIS — M25561 Pain in right knee: Secondary | ICD-10-CM

## 2022-06-16 DIAGNOSIS — M7062 Trochanteric bursitis, left hip: Secondary | ICD-10-CM

## 2022-06-16 DIAGNOSIS — M1711 Unilateral primary osteoarthritis, right knee: Secondary | ICD-10-CM | POA: Diagnosis not present

## 2022-06-16 MED ORDER — LIDOCAINE HCL 1 % IJ SOLN
3.0000 mL | INTRAMUSCULAR | Status: AC | PRN
  Administered 2022-06-16: 3 mL

## 2022-06-16 MED ORDER — METHYLPREDNISOLONE ACETATE 40 MG/ML IJ SUSP
40.0000 mg | INTRAMUSCULAR | Status: AC | PRN
  Administered 2022-06-16: 40 mg via INTRA_ARTICULAR

## 2022-06-16 NOTE — Progress Notes (Signed)
Office Visit Note   Patient: Diana Bowers           Date of Birth: March 20, 1951           MRN: [id number] Visit Date: 06/16/2022              Requested by: Caffie Damme, MD [address] [place],  [place] [address] PCP: Caffie Damme, MD   Assessment & Plan: Visit Diagnoses:  1. Primary osteoarthritis of right knee   2. Chronic pain of right knee   3. Trochanteric bursitis, left hip     Plan: The patient is fully aware of the risk and benefits of steroid injections.  She tolerated steroid injections in her left hip trochanteric area and her right knee joint today.  We can repeat this in 3 months right before her trip that she is taking in September to [place].  He will be right at 3 months at that visit and I would be fine with having steroid injections in her right knee and left hip area then if needed.  Follow-Up Instructions: Return in about 3 months (around 09/16/2022).   Orders:  Orders Placed This Encounter  Procedures   Large Joint Inj   Large Joint Inj   No orders of the defined types were placed in this encounter.     Procedures: Large Joint Inj: R knee on 06/16/2022 3:08 PM Indications: diagnostic evaluation and pain Details: 22 G 1.5 in needle, superolateral approach  Arthrogram: No  Medications: 3 mL lidocaine 1 %; 40 mg methylPREDNISolone acetate 40 MG/ML Outcome: tolerated well, no immediate complications Procedure, treatment alternatives, risks and benefits explained, specific risks discussed. Consent was given by the patient. Immediately prior to procedure a time out was called to verify the correct patient, procedure, equipment, support staff and site/side marked as required. Patient was prepped and draped in the usual sterile fashion.    Large Joint Inj: L greater trochanter on 06/16/2022 3:08 PM Indications: pain and diagnostic evaluation Details: 22 G 1.5 in needle, lateral approach  Arthrogram: No  Medications: 3 mL lidocaine 1 %; 40 mg  methylPREDNISolone acetate 40 MG/ML Outcome: tolerated well, no immediate complications Procedure, treatment alternatives, risks and benefits explained, specific risks discussed. Consent was given by the patient. Immediately prior to procedure a time out was called to verify the correct patient, procedure, equipment, support staff and site/side marked as required. Patient was prepped and draped in the usual sterile fashion.       Clinical Data: No additional findings.   Subjective: Chief Complaint  Patient presents with   Right Knee - Pain   Left Hip - Pain  The patient is well-known to me.  She has a history of a left knee replacement and a right hip replacement.  She has well-documented significant arthritis in the right knee and arthritis in her left hip but more of bursitis at her left hip trochanteric area.  She is requesting steroid injections in her right knee and left hip trochanteric area today since she is going on a trip next month.  She does state at some point she is going consider right knee replacement.  She is 71 years old and very active.  She has had no acute change in her medical status.  She is prediabetic.  HPI  Review of Systems There is currently listed no fever, chills, nausea, vomiting  Objective: Vital Signs: There were no vitals taken for this visit.  Physical Exam She is alert and  orient x3 and in no acute distress Ortho Exam Examination of her left hip shows pain over the trochanteric area and minimal pain in the groin with good range of motion.  Examination of the right knee shows varus malalignment and slight flexion contracture with patellofemoral pain and medial joint line tenderness. Specialty Comments:  No specialty comments available.  Imaging: No results found.   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   Status post revision of total replacement of  left knee 09/15/2018   Trochanteric bursitis, left hip 08/10/2017   Osteoarthritis of right hip 07/22/2015   Status post total replacement of right hip 07/22/2015   Acute blood loss anemia 01/04/2013   Osteoarthritis of left knee 01/01/2013    Class: Chronic   Past Medical History:  Diagnosis Date   Arthritis    "left knee" (01/01/2013)   Diabetes mellitus without complication (HCC)    borderline no med   DVT of lower extremity (deep venous thrombosis) (HCC) 2007   "LLE; from birth control pill" (01/01/2013)   GERD (gastroesophageal reflux disease)    Headache    pt. states migraines at times   Hypercholesteremia    Crestor   Hypertension     Family History  Problem Relation Age of Onset   Breast cancer Neg Hx     Past Surgical History:  Procedure Laterality Date   ANTERIOR CERVICAL DECOMP/DISCECTOMY FUSION  2006   CARPAL TUNNEL RELEASE  2000   "right" (01/01/2013)   CESAREAN SECTION  1978   COLONOSCOPY     ESOPHAGOGASTRODUODENOSCOPY     I & D KNEE WITH POLY EXCHANGE Left 09/15/2018   Procedure: POLY EXCHANGE LEFT KNEE,/,PATELLAR COMPONENT REVISION  STEROID INJECTION LEFT HIP;  Surgeon: Kathryne Hitch, MD;  Location: [facility];  Service: Orthopedics;  Laterality: Left;   KIDNEY DONATION  1994   KNEE ARTHROPLASTY  01/01/2013   Procedure: COMPUTER ASSISTED TOTAL KNEE ARTHROPLASTY;  Surgeon: Kerrin Champagne, MD;  Location: [facility];  Service: Orthopedics;  Laterality: Left;  Left computer assisted total knee replacement   LIPOMA EXCISION  2000   back   REPLACEMENT TOTAL KNEE  01/01/2013   "left" (01/01/2013)   TOTAL HIP ARTHROPLASTY Right 07/22/2015   Procedure: RIGHT TOTAL HIP ARTHROPLASTY ANTERIOR APPROACH;  Surgeon: Kathryne Hitch, MD;  Location: [facility];  Service: Orthopedics;  Laterality: Right;   Social History   Occupational History   Not on file  Tobacco Use   Smoking status: Never   Smokeless tobacco: Never  Vaping Use   Vaping Use: Never used  Substance and  Sexual Activity   Alcohol use: No   Drug use: No   Sexual activity: Yes

## 2022-06-17 ENCOUNTER — Telehealth: Payer: Self-pay | Admitting: Orthopaedic Surgery

## 2022-06-17 ENCOUNTER — Other Ambulatory Visit: Payer: Self-pay | Admitting: Orthopaedic Surgery

## 2022-06-17 MED ORDER — TRAMADOL HCL 50 MG PO TABS: 50.0000 mg | ORAL_TABLET | Freq: Three times a day (TID) | ORAL | 0 refills | Status: DC | PRN

## 2022-06-17 NOTE — Telephone Encounter (Signed)
Pt called requesting a refill of tramadol. Please send to pharmacy on file. Pt phone number is [phone].

## 2022-07-14 ENCOUNTER — Ambulatory Visit
Admission: RE | Admit: 2022-07-14 | Discharge: 2022-07-14 | Disposition: A | Discharge status: Home / Self Care | Payer: Medicare Other | Source: Ambulatory Visit | Attending: Family Medicine | Admitting: Family Medicine

## 2022-07-14 DIAGNOSIS — Z1231 Encounter for screening mammogram for malignant neoplasm of breast: Secondary | ICD-10-CM

## 2022-08-02 ENCOUNTER — Ambulatory Visit: Payer: Medicare Other | Admitting: Physician Assistant

## 2022-09-06 ENCOUNTER — Encounter: Payer: Self-pay | Admitting: Orthopaedic Surgery

## 2022-09-06 ENCOUNTER — Ambulatory Visit (INDEPENDENT_AMBULATORY_CARE_PROVIDER_SITE_OTHER): Payer: Medicare Other | Admitting: Orthopaedic Surgery

## 2022-09-06 DIAGNOSIS — G8929 Other chronic pain: Secondary | ICD-10-CM | POA: Diagnosis not present

## 2022-09-06 DIAGNOSIS — M7062 Trochanteric bursitis, left hip: Secondary | ICD-10-CM | POA: Diagnosis not present

## 2022-09-06 DIAGNOSIS — M25561 Pain in right knee: Secondary | ICD-10-CM

## 2022-09-06 MED ORDER — METHYLPREDNISOLONE ACETATE 40 MG/ML IJ SUSP
40.0000 mg | INTRAMUSCULAR | Status: AC | PRN
  Administered 2022-09-06: 40 mg via INTRA_ARTICULAR

## 2022-09-06 MED ORDER — LIDOCAINE HCL 1 % IJ SOLN
3.0000 mL | INTRAMUSCULAR | Status: AC | PRN
  Administered 2022-09-06: 3 mL

## 2022-09-06 MED ORDER — TRAMADOL HCL 50 MG PO TABS: 50.0000 mg | ORAL_TABLET | Freq: Three times a day (TID) | ORAL | 0 refills | Status: DC | PRN

## 2022-09-06 NOTE — Progress Notes (Signed)
Office Visit Note   Patient: Diana Bowers           Date of Birth: May 20, 1951           MRN: [id number] Visit Date: 09/06/2022              Requested by: Caffie Damme, MD [address] [place],  [place] [address] PCP: Caffie Damme, MD   Assessment & Plan: Visit Diagnoses:  1. Chronic pain of right knee   2. Trochanteric bursitis, left hip     Plan: Per the patient's request I did provide a steroid injection in her right knee today and her left hip trochanteric area and she tolerated both of these well.  All questions and concerns were answered and addressed.  She did request a prescription for tramadol and I agreed to this.  Follow-Up Instructions: Return if symptoms worsen or fail to improve.   Orders:  Orders Placed This Encounter  Procedures   Large Joint Inj   Large Joint Inj   No orders of the defined types were placed in this encounter.     Procedures: Large Joint Inj: R knee on 09/06/2022 3:10 PM Indications: diagnostic evaluation and pain Details: 22 G 1.5 in needle, superolateral approach  Arthrogram: No  Medications: 3 mL lidocaine 1 %; 40 mg methylPREDNISolone acetate 40 MG/ML Outcome: tolerated well, no immediate complications Procedure, treatment alternatives, risks and benefits explained, specific risks discussed. Consent was given by the patient. Immediately prior to procedure a time out was called to verify the correct patient, procedure, equipment, support staff and site/side marked as required. Patient was prepped and draped in the usual sterile fashion.    Large Joint Inj: L greater trochanter on 09/06/2022 3:10 PM Indications: pain and diagnostic evaluation Details: 22 G 1.5 in needle, lateral approach  Arthrogram: No  Medications: 3 mL lidocaine 1 %; 40 mg methylPREDNISolone acetate 40 MG/ML Outcome: tolerated well, no immediate complications Procedure, treatment alternatives, risks and benefits explained, specific risks discussed. Consent was  given by the patient. Immediately prior to procedure a time out was called to verify the correct patient, procedure, equipment, support staff and site/side marked as required. Patient was prepped and draped in the usual sterile fashion.       Clinical Data: No additional findings.   Subjective: Chief Complaint  Patient presents with   Right Knee - Follow-up   Left Hip - Follow-up  The patient is well-known to me.  She is going on a trip soon and comes in requesting steroid injections in her right knee joint and her left hip trochanteric area.  She has had these types of injections before and has done well with these injections.  The right knee has known osteoarthritis in the left hip as tendinitis and bursitis.  She is an active 71 year old female.  She has had no acute change in her medical status.  She is not a diabetic.  HPI  Review of Systems There is currently listed no fever, chills, nausea, vomiting  Objective: Vital Signs: There were no vitals taken for this visit.  Physical Exam She is alert and oriented x3 and in no acute distress Ortho Exam Examination of her right knee shows no effusion with good range of motion but pain throughout the arc of motion.  Examination of her left hip shows no groin pain but pain over the lateral aspect of her left hip. Specialty Comments:  No specialty comments available.  Imaging: No results found.  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   Status post revision of total replacement of left knee 09/15/2018   Trochanteric bursitis, left hip 08/10/2017   Osteoarthritis of right hip 07/22/2015   Status post total replacement of right hip 07/22/2015   Acute blood loss anemia 01/04/2013   Osteoarthritis of left knee 01/01/2013    Class: Chronic   Past Medical History:  Diagnosis Date   Arthritis    "left knee" (01/01/2013)   Diabetes  mellitus without complication (HCC)    borderline no med   DVT of lower extremity (deep venous thrombosis) (HCC) 2007   "LLE; from birth control pill" (01/01/2013)   GERD (gastroesophageal reflux disease)    Headache    pt. states migraines at times   Hypercholesteremia    Crestor   Hypertension     Family History  Problem Relation Age of Onset   Breast cancer Neg Hx     Past Surgical History:  Procedure Laterality Date   ANTERIOR CERVICAL DECOMP/DISCECTOMY FUSION  2006   CARPAL TUNNEL RELEASE  2000   "right" (01/01/2013)   CESAREAN SECTION  1978   COLONOSCOPY     ESOPHAGOGASTRODUODENOSCOPY     I & D KNEE WITH POLY EXCHANGE Left 09/15/2018   Procedure: POLY EXCHANGE LEFT KNEE,/,PATELLAR COMPONENT REVISION  STEROID INJECTION LEFT HIP;  Surgeon: Kathryne Hitch, MD;  Location: [facility];  Service: Orthopedics;  Laterality: Left;   KIDNEY DONATION  1994   KNEE ARTHROPLASTY  01/01/2013   Procedure: COMPUTER ASSISTED TOTAL KNEE ARTHROPLASTY;  Surgeon: Kerrin Champagne, MD;  Location: [facility];  Service: Orthopedics;  Laterality: Left;  Left computer assisted total knee replacement   LIPOMA EXCISION  2000   back   REPLACEMENT TOTAL KNEE  01/01/2013   "left" (01/01/2013)   TOTAL HIP ARTHROPLASTY Right 07/22/2015   Procedure: RIGHT TOTAL HIP ARTHROPLASTY ANTERIOR APPROACH;  Surgeon: Kathryne Hitch, MD;  Location: [facility];  Service: Orthopedics;  Laterality: Right;   Social History   Occupational History   Not on file  Tobacco Use   Smoking status: Never   Smokeless tobacco: Never  Vaping Use   Vaping Use: Never used  Substance and Sexual Activity   Alcohol use: No   Drug use: No   Sexual activity: Yes

## 2023-01-11 ENCOUNTER — Other Ambulatory Visit: Payer: Self-pay | Admitting: Orthopaedic Surgery

## 2023-01-11 MED ORDER — TRAMADOL HCL 50 MG PO TABS: 50.0000 mg | ORAL_TABLET | Freq: Three times a day (TID) | ORAL | 0 refills | Status: DC | PRN

## 2023-01-27 ENCOUNTER — Ambulatory Visit (INDEPENDENT_AMBULATORY_CARE_PROVIDER_SITE_OTHER): Payer: Medicare Other | Admitting: Orthopaedic Surgery

## 2023-01-27 ENCOUNTER — Ambulatory Visit: Payer: Self-pay

## 2023-01-27 ENCOUNTER — Ambulatory Visit (INDEPENDENT_AMBULATORY_CARE_PROVIDER_SITE_OTHER): Payer: Medicare Other | Admitting: Sports Medicine

## 2023-01-27 ENCOUNTER — Encounter: Payer: Self-pay | Admitting: Sports Medicine

## 2023-01-27 DIAGNOSIS — M25552 Pain in left hip: Secondary | ICD-10-CM

## 2023-01-27 DIAGNOSIS — M1612 Unilateral primary osteoarthritis, left hip: Secondary | ICD-10-CM

## 2023-01-27 MED ORDER — LIDOCAINE HCL 1 % IJ SOLN
4.0000 mL | INTRAMUSCULAR | Status: AC | PRN
  Administered 2023-01-27: 4 mL

## 2023-01-27 MED ORDER — METHYLPREDNISOLONE ACETATE 40 MG/ML IJ SUSP
80.0000 mg | INTRAMUSCULAR | Status: AC | PRN
  Administered 2023-01-27: 80 mg via INTRA_ARTICULAR

## 2023-01-27 NOTE — Progress Notes (Signed)
Diana Bowers - 72 y.o. female MRN [id number]  Date of birth: 1951/06/11  Office Visit Note: Visit Date: 01/27/2023 PCP: [name], MD Referred by: [name], MD  Subjective: Chief Complaint  Patient presents with   Left Hip - Pain   HPI: Diana Bowers is a pleasant 72 y.o. female who presents today for left hip pain.  She had a greater trochanteric bursa injection in the left hip (greater trochanteric bursa) on 09/06/22. Did get good relief from this at that time.  History of right hip THA years ago, doing well.   Over the last few months she has had more pain within the hip joint and into the left groin.  Notices this with certain movement about the hips or up/down stairs. She is very active going to the [facility] daily. Has Tramadol she takes for more severe pain - gets from [name]  Pertinent ROS were reviewed with the patient and found to be negative unless otherwise specified above in HPI.   Assessment & Plan: Visit Diagnoses:  1. Unilateral primary osteoarthritis, left hip   2. Pain in left hip    Plan: Discussed with Diana Bowers likely etiology of her left hip pain.  She has had greater trochanteric pain in the past, although the pain today is more consistent with intra-articular hip pain.  Previous x-rays do show some arthritis within the hip joint, although certainly not end-stage.  Through shared decision-making, elected to proceed with ultrasound-guided intra-articular hip injection, patient tolerated well.  Will allow 48 hours of modified rest, and then may return to activity - urged continuing [employer] and rehab exercises.  She may use her Tramadol as needed for break-through pain provided by [name]. Follow-up with me/him as needed.   Follow-up: Return if symptoms worsen or fail to improve.   Meds & Orders: No orders of the defined types were placed in this encounter.   Orders Placed This Encounter  Procedures   Large Joint Inj   US Guided Needle Placement - No Linked  Charges     Procedures: Large Joint Inj: L hip joint on 01/27/2023 1:52 PM Indications: pain Details: 22 G 3.5 in needle, ultrasound-guided anterior approach Medications: 4 mL lidocaine 1 %; 80 mg methylPREDNISolone acetate 40 MG/ML Outcome: tolerated well, no immediate complications  Procedure: US-guided intra-articular hip injection, left After discussion on risks/benefits/indications and informed verbal consent was obtained, a timeout was performed. Patient was lying supine on exam table. The hip was cleaned with betadine and alcohol swabs. Then utilizing ultrasound guidance, the patient's femoral head and neck junction was identified and subsequently injected with 4:2 lidocaine:depomedrol via an in-plane approach with ultrasound visualization of the injectate administered into the hip joint. Patient tolerated procedure well without immediate complications.  Procedure, treatment alternatives, risks and benefits explained, specific risks discussed. Consent was given by the patient. Immediately prior to procedure a time out was called to verify the correct patient, procedure, equipment, support staff and site/side marked as required. Patient was prepped and draped in the usual sterile fashion.          Clinical History: No specialty comments available.  She reports that she has never smoked. She has never used smokeless tobacco. No results for input(s): "HGBA1C", "LABURIC" in the last 8760 hours.  Objective:    Physical Exam  Gen: Well-appearing, in no acute distress; non-toxic CV:  Well-perfused. Warm.  Resp: Breathing unlabored on room air; no wheezing. Psych: Fluid speech in conversation; appropriate affect; normal  thought process Neuro: Sensation intact throughout. No gross coordination deficits.   Ortho Exam - Left hip: No significant bony TTP at the ASIS or greater trochanteric region.  There is some mild pain with passive logroll internally, good range of motion externally  without pain. NVI.  - Right hip: well-healed prior THA scar. Fluid motion about the hip.  Imaging:  Xray of left hip/pelvis from 01/14/21:  AP pelvis: Right total hip arthroplasty components well-seated.  No acute  fractures.  Left hip is slightly elongated femoral head but the  weightbearing results base of the hip joint is well-maintained.  Left hip  is well located.  Periarticular superior spur present left hip.   Past Medical/Family/Surgical/Social History: Medications & Allergies reviewed per EMR, new medications updated. 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 ([name]) 09/15/2018   Status post revision of total replacement of left knee 09/15/2018   Trochanteric bursitis, left hip 08/10/2017   Osteoarthritis of right hip 07/22/2015   Status post total replacement of right hip 07/22/2015   Acute blood loss anemia 01/04/2013   Osteoarthritis of left knee 01/01/2013    Class: Chronic   Past Medical History:  Diagnosis Date   Arthritis    "left knee" (01/01/2013)   Diabetes mellitus without complication (HCC)    borderline no med   DVT of lower extremity (deep venous thrombosis) ([place]) 2007   "LLE; from birth control pill" (01/01/2013)   GERD (gastroesophageal reflux disease)    Headache    pt. states migraines at times   Hypercholesteremia    Crestor   Hypertension    Family History  Problem Relation Age of Onset   Breast cancer Neg Hx    Past Surgical History:  Procedure Laterality Date   ANTERIOR CERVICAL DECOMP/DISCECTOMY FUSION  2006   CARPAL TUNNEL RELEASE  2000   "right" (01/01/2013)   [place]   COLONOSCOPY     ESOPHAGOGASTRODUODENOSCOPY     I & D KNEE WITH POLY EXCHANGE Left 09/15/2018   Procedure: POLY EXCHANGE LEFT KNEE,/,PATELLAR COMPONENT REVISION  STEROID INJECTION LEFT HIP;  Surgeon: Mcarthur Rossetti, MD;  Location: [facility];  Service: Orthopedics;  Laterality: Left;   KIDNEY  DONATION  1994   KNEE ARTHROPLASTY  01/01/2013   Procedure: COMPUTER ASSISTED TOTAL KNEE ARTHROPLASTY;  Surgeon: Jessy Oto, MD;  Location: [place];  Service: Orthopedics;  Laterality: Left;  Left computer assisted total knee replacement   LIPOMA EXCISION  2000   back   REPLACEMENT TOTAL KNEE  01/01/2013   "left" (01/01/2013)   TOTAL HIP ARTHROPLASTY Right 07/22/2015   Procedure: RIGHT TOTAL HIP ARTHROPLASTY ANTERIOR APPROACH;  Surgeon: Mcarthur Rossetti, MD;  Location: [place];  Service: Orthopedics;  Laterality: Right;   Social History   Occupational History   Not on file  Tobacco Use   Smoking status: Never   Smokeless tobacco: Never  Vaping Use   Vaping Use: Never used  Substance and Sexual Activity   Alcohol use: No   Drug use: No   Sexual activity: Yes

## 2023-01-28 NOTE — Progress Notes (Signed)
The patient was not seen by [name].  Patient was seen by Dr. Elba Barman

## 2023-03-03 ENCOUNTER — Encounter: Payer: Self-pay | Admitting: Radiology

## 2023-04-20 ENCOUNTER — Other Ambulatory Visit: Payer: Self-pay | Admitting: Orthopaedic Surgery

## 2023-04-20 MED ORDER — TRAMADOL HCL 50 MG PO TABS: 50.0000 mg | ORAL_TABLET | Freq: Three times a day (TID) | ORAL | 0 refills | Status: DC | PRN

## 2023-06-10 ENCOUNTER — Other Ambulatory Visit: Payer: Self-pay | Admitting: Family Medicine

## 2023-06-10 DIAGNOSIS — Z1231 Encounter for screening mammogram for malignant neoplasm of breast: Secondary | ICD-10-CM

## 2023-06-20 ENCOUNTER — Encounter: Payer: Self-pay | Admitting: Physician Assistant

## 2023-06-20 ENCOUNTER — Ambulatory Visit (INDEPENDENT_AMBULATORY_CARE_PROVIDER_SITE_OTHER): Payer: Medicare Other | Admitting: Physician Assistant

## 2023-06-20 DIAGNOSIS — M1711 Unilateral primary osteoarthritis, right knee: Secondary | ICD-10-CM

## 2023-06-20 DIAGNOSIS — M7062 Trochanteric bursitis, left hip: Secondary | ICD-10-CM

## 2023-06-20 MED ORDER — LIDOCAINE HCL 1 % IJ SOLN
3.0000 mL | INTRAMUSCULAR | Status: AC | PRN
  Administered 2023-06-20: 3 mL

## 2023-06-20 MED ORDER — METHYLPREDNISOLONE ACETATE 40 MG/ML IJ SUSP
40.0000 mg | INTRAMUSCULAR | Status: AC | PRN
  Administered 2023-06-20: 40 mg via INTRA_ARTICULAR

## 2023-06-20 NOTE — Progress Notes (Signed)
Office Visit Note   Patient: Diana Bowers           Date of Birth: 02-28-1951           MRN: [id number] Visit Date: 06/20/2023              Requested by: Caffie Damme, MD [address] [place],  [place] [address] PCP: Caffie Damme, MD   Assessment & Plan: Visit Diagnoses:  1. Primary osteoarthritis of right knee   2. Trochanteric bursitis, left hip     Plan: She is shown IT band stretching exercises.  In regards to her right knee she would like to undergo right total knee arthroplasty after August 22.  She reports that she has no 1 to help her postop and would like to go to camp in place postop.  Risk benefits of surgery discussed.  She understands the risk and benefits that she has undergone a left total knee arthroplasty and also a left knee poly exchange.  Risk include but are not limited to DVT/PE, wound healing problems, nerve or vessel injury, blood loss and prolonged pain.  Follow-Up Instructions: Return for Postop.   Orders:  Orders Placed This Encounter  Procedures   Large Joint Inj: L greater trochanter   No orders of the defined types were placed in this encounter.     Procedures: Large Joint Inj: L greater trochanter on 06/20/2023 3:36 PM Indications: pain Details: 22 G 1.5 in needle, lateral approach  Arthrogram: No  Medications: 3 mL lidocaine 1 %; 40 mg methylPREDNISolone acetate 40 MG/ML Outcome: tolerated well, no immediate complications Procedure, treatment alternatives, risks and benefits explained, specific risks discussed. Consent was given by the patient. Immediately prior to procedure a time out was called to verify the correct patient, procedure, equipment, support staff and site/side marked as required. Patient was prepped and draped in the usual sterile fashion.       Clinical Data: No additional findings.   Subjective: Chief Complaint  Patient presents with   Left Hip - Pain    HPI Diana Bowers comes in today for left hip pain requesting  a trochanteric injection.  She did undergo an intra-articular injection left hip on 01/27/2023 by Dr. Shon Baton states it really gave her no relief.  In regards to her right knee she has known arthritis of the right knee and has tried conservative treatments which included both cortisone and viscosupplementation injections.  She continues to have severe pain in the right knee that is affecting her quality of life.  She is wanting to schedule right total knee replacement in the near future.  She denies any fevers chills.  Denies any ongoing infections.  Denies any chest pain shortness of breath.  She is prediabetic.  She otherwise denies any change in her overall health status.  Does have a remote history of DVT.  Currently on no anticoagulation.  Review of Systems See HPI otherwise negative  Objective: Vital Signs: There were no vitals taken for this visit.  Physical Exam General: Well-developed well-nourished female no acute distress mood and affect appropriate. Psych: Alert and oriented x 3 Ortho Exam Left hip good range of motion.  Tenderness over the trochanteric region. Right knee: No abnormal warmth erythema or effusion.  Patellofemoral crepitus with passive range of motion.  Tenderness over the medial joint line.  Calf supple nontender.  Left knee full range of motion without pain. Specialty Comments:  No specialty comments available.  Imaging: No results found.  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   Status post revision of total replacement of left knee 09/15/2018   Trochanteric bursitis, left hip 08/10/2017   Osteoarthritis of right hip 07/22/2015   Status post total replacement of right hip 07/22/2015   Acute blood loss anemia 01/04/2013   Osteoarthritis of left knee 01/01/2013    Class: Chronic   Past Medical History:  Diagnosis Date   Arthritis    "left knee" (01/01/2013)    Diabetes mellitus without complication (HCC)    borderline no med   DVT of lower extremity (deep venous thrombosis) (HCC) 2007   "LLE; from birth control pill" (01/01/2013)   GERD (gastroesophageal reflux disease)    Headache    pt. states migraines at times   Hypercholesteremia    Crestor   Hypertension     Family History  Problem Relation Age of Onset   Breast cancer Neg Hx     Past Surgical History:  Procedure Laterality Date   ANTERIOR CERVICAL DECOMP/DISCECTOMY FUSION  2006   CARPAL TUNNEL RELEASE  2000   "right" (01/01/2013)   CESAREAN SECTION  1978   COLONOSCOPY     ESOPHAGOGASTRODUODENOSCOPY     I & D KNEE WITH POLY EXCHANGE Left 09/15/2018   Procedure: POLY EXCHANGE LEFT KNEE,/,PATELLAR COMPONENT REVISION  STEROID INJECTION LEFT HIP;  Surgeon: Kathryne Hitch, MD;  Location: [facility];  Service: Orthopedics;  Laterality: Left;   KIDNEY DONATION  1994   KNEE ARTHROPLASTY  01/01/2013   Procedure: COMPUTER ASSISTED TOTAL KNEE ARTHROPLASTY;  Surgeon: Kerrin Champagne, MD;  Location: [facility];  Service: Orthopedics;  Laterality: Left;  Left computer assisted total knee replacement   LIPOMA EXCISION  2000   back   REPLACEMENT TOTAL KNEE  01/01/2013   "left" (01/01/2013)   TOTAL HIP ARTHROPLASTY Right 07/22/2015   Procedure: RIGHT TOTAL HIP ARTHROPLASTY ANTERIOR APPROACH;  Surgeon: Kathryne Hitch, MD;  Location: [facility];  Service: Orthopedics;  Laterality: Right;   Social History   Occupational History   Not on file  Tobacco Use   Smoking status: Never   Smokeless tobacco: Never  Vaping Use   Vaping Use: Never used  Substance and Sexual Activity   Alcohol use: No   Drug use: No   Sexual activity: Yes

## 2023-06-29 ENCOUNTER — Telehealth: Payer: Self-pay

## 2023-06-29 NOTE — Telephone Encounter (Signed)
I called patient to discuss scheduling TKA.  Left voice mail for her to return my call.

## 2023-07-12 ENCOUNTER — Other Ambulatory Visit: Payer: Self-pay | Admitting: Physician Assistant

## 2023-07-12 MED ORDER — TRAMADOL HCL 50 MG PO TABS: 50.0000 mg | ORAL_TABLET | Freq: Three times a day (TID) | ORAL | 0 refills | Status: DC | PRN

## 2023-07-18 ENCOUNTER — Ambulatory Visit
Admission: RE | Admit: 2023-07-18 | Discharge: 2023-07-18 | Disposition: A | Discharge status: Home / Self Care | Payer: Medicare Other | Source: Ambulatory Visit | Attending: Family Medicine | Admitting: Family Medicine

## 2023-07-18 DIAGNOSIS — Z1231 Encounter for screening mammogram for malignant neoplasm of breast: Secondary | ICD-10-CM

## 2023-08-09 ENCOUNTER — Other Ambulatory Visit: Payer: Self-pay | Admitting: Physician Assistant

## 2023-08-09 MED ORDER — TRAMADOL HCL 50 MG PO TABS: 50.0000 mg | ORAL_TABLET | Freq: Three times a day (TID) | ORAL | 0 refills | Status: DC | PRN

## 2023-08-18 ENCOUNTER — Other Ambulatory Visit: Payer: Self-pay

## 2023-08-22 NOTE — Patient Instructions (Signed)
SURGICAL WAITING ROOM VISITATION Patients having surgery or a procedure may have no more than 2 support people in the waiting area - these visitors may rotate.    Children under the age of 85 must have an adult with them who is not the patient.  If the patient needs to stay at the hospital during part of their recovery, the visitor guidelines for inpatient rooms apply. Pre-op nurse will coordinate an appropriate time for 1 support person to accompany patient in pre-op.  This support person may not rotate.    Please refer to the [facility] website for the visitor guidelines for Inpatients (after your surgery is over and you are in a regular room).       Your procedure is scheduled on: 09-02-23   Report to [facility] Main Entrance    Report to admitting at 11:30 AM   Call this number if you have problems the morning of surgery [phone]   Do not eat food :After Midnight.   After Midnight you may have the following liquids until 11:00 AM DAY OF SURGERY  Water Non-Citrus Juices (without pulp, NO RED-Apple, White grape, White cranberry) Black Coffee (NO MILK/CREAM OR CREAMERS, sugar ok)  Clear Tea (NO MILK/CREAM OR CREAMERS, sugar ok) regular and decaf                             Plain Jell-O (NO RED)                                           Fruit ices (not with fruit pulp, NO RED)                                     Popsicles (NO RED)                                                               Sports drinks like Gatorade (NO RED)                   The day of surgery:  Drink ONE (1) Pre-Surgery G2 at 10:45 AM the morning of surgery. Drink in one sitting. Do not sip.  This drink was given to you during your hospital  pre-op appointment visit. Nothing else to drink after completing the Pre-Surgery G2.          If you have questions, please contact your surgeon's office.   FOLLOW  ANY ADDITIONAL PRE OP INSTRUCTIONS YOU RECEIVED FROM YOUR SURGEON'S OFFICE!!!      Oral Hygiene is also important to reduce your risk of infection.                                    Remember - BRUSH YOUR TEETH THE MORNING OF SURGERY WITH YOUR REGULAR TOOTHPASTE   Do NOT smoke after Midnight   Take these medicines the morning of surgery with A SIP OF WATER:   Diltiazem  Tramadol if needed  Okay  to use eyedrops  Stop all vitamins and herbal supplements 7 days before surgery  Bring CPAP mask and tubing day of surgery.                              You may not have any metal on your body including hair pins, jewelry, and body piercing             Do not wear make-up, lotions, powders, perfumes or deodorant  Do not wear nail polish including gel and S&S, artificial/acrylic nails, or any other type of covering on natural nails including finger and toenails. If you have artificial nails, gel coating, etc. that needs to be removed by a nail salon please have this removed prior to surgery or surgery may need to be canceled/ delayed if the surgeon/ anesthesia feels like they are unable to be safely monitored.   Do not shave  48 hours prior to surgery.    Do not bring valuables to the hospital. [facility] IS NOT RESPONSIBLE   FOR VALUABLES.   Contacts, dentures or bridgework may not be worn into surgery.   Bring small overnight bag day of surgery.   DO NOT BRING YOUR HOME MEDICATIONS TO THE HOSPITAL. PHARMACY WILL DISPENSE MEDICATIONS LISTED ON YOUR MEDICATION LIST TO YOU DURING YOUR ADMISSION IN THE HOSPITAL!               Please read over the following fact sheets you were given: IF YOU HAVE QUESTIONS ABOUT YOUR PRE-OP INSTRUCTIONS PLEASE CALL [phone] Gwen  If you received a COVID test during your pre-op visit  it is requested that you wear a mask when out in public, stay away from anyone that may not be feeling well and notify your surgeon if you develop symptoms. If you test positive for Covid or have been in contact with anyone that has tested positive in the  last 10 days please notify you surgeon.    Pre-operative 5 CHG Bath Instructions   You can play a key role in reducing the risk of infection after surgery. Your skin needs to be as free of germs as possible. You can reduce the number of germs on your skin by washing with CHG (chlorhexidine gluconate) soap before surgery. CHG is an antiseptic soap that kills germs and continues to kill germs even after washing.   DO NOT use if you have an allergy to chlorhexidine/CHG or antibacterial soaps. If your skin becomes reddened or irritated, stop using the CHG and notify one of our RNs at [phone].   Please shower with the CHG soap starting 4 days before surgery using the following schedule:     Please keep in mind the following:  DO NOT shave, including legs and underarms, starting the day of your first shower.   You may shave your face at any point before/day of surgery.  Place clean sheets on your bed the day you start using CHG soap. Use a clean washcloth (not used since being washed) for each shower. DO NOT sleep with pets once you start using the CHG.   CHG Shower Instructions:  If you choose to wash your hair and private area, wash first with your normal shampoo/soap.  After you use shampoo/soap, rinse your hair and body thoroughly to remove shampoo/soap residue.  Turn the water OFF and apply about 3 tablespoons (45 ml) of CHG soap to a CLEAN washcloth.  Apply CHG soap ONLY  FROM YOUR NECK DOWN TO YOUR TOES (washing for 3-5 minutes)  DO NOT use CHG soap on face, private areas, open wounds, or sores.  Pay special attention to the area where your surgery is being performed.  If you are having back surgery, having someone wash your back for you may be helpful. Wait 2 minutes after CHG soap is applied, then you may rinse off the CHG soap.  Pat dry with a clean towel  Put on clean clothes/pajamas   If you choose to wear lotion, please use ONLY the CHG-compatible lotions on the back of  this paper.     Additional instructions for the day of surgery: DO NOT APPLY any lotions, deodorants, cologne, or perfumes.   Put on clean/comfortable clothes.  Brush your teeth.  Ask your nurse before applying any prescription medications to the skin.      CHG Compatible Lotions   Aveeno Moisturizing lotion  Cetaphil Moisturizing Cream  Cetaphil Moisturizing Lotion  Clairol Herbal Essence Moisturizing Lotion, Dry Skin  Clairol Herbal Essence Moisturizing Lotion, Extra Dry Skin  Clairol Herbal Essence Moisturizing Lotion, Normal Skin  Curel Age Defying Therapeutic Moisturizing Lotion with Alpha Hydroxy  Curel Extreme Care Body Lotion  Curel Soothing Hands Moisturizing Hand Lotion  Curel Therapeutic Moisturizing Cream, Fragrance-Free  Curel Therapeutic Moisturizing Lotion, Fragrance-Free  Curel Therapeutic Moisturizing Lotion, Original Formula  Eucerin Daily Replenishing Lotion  Eucerin Dry Skin Therapy Plus Alpha Hydroxy Crme  Eucerin Dry Skin Therapy Plus Alpha Hydroxy Lotion  Eucerin Original Crme  Eucerin Original Lotion  Eucerin Plus Crme Eucerin Plus Lotion  Eucerin TriLipid Replenishing Lotion  Keri Anti-Bacterial Hand Lotion  Keri Deep Conditioning Original Lotion Dry Skin Formula Softly Scented  Keri Deep Conditioning Original Lotion, Fragrance Free Sensitive Skin Formula  Keri Lotion Fast Absorbing Fragrance Free Sensitive Skin Formula  Keri Lotion Fast Absorbing Softly Scented Dry Skin Formula  Keri Original Lotion  Keri Skin Renewal Lotion Keri Silky Smooth Lotion  Keri Silky Smooth Sensitive Skin Lotion  Nivea Body Creamy Conditioning Oil  Nivea Body Extra Enriched Lotion  Nivea Body Original Lotion  Nivea Body Sheer Moisturizing Lotion Nivea Crme  Nivea Skin Firming Lotion  NutraDerm 30 Skin Lotion  NutraDerm Skin Lotion  NutraDerm Therapeutic Skin Cream  NutraDerm Therapeutic Skin Lotion  ProShield Protective Hand Cream  Provon moisturizing lotion    PATIENT SIGNATURE_________________________________  NURSE SIGNATURE__________________________________  ________________________________________________________________________    Rogelia Mire  An incentive spirometer is a tool that can help keep your lungs clear and active. This tool measures how well you are filling your lungs with each breath. Taking long deep breaths may help reverse or decrease the chance of developing breathing (pulmonary) problems (especially infection) following: A long period of time when you are unable to move or be active. BEFORE THE PROCEDURE  If the spirometer includes an indicator to show your best effort, your nurse or respiratory therapist will set it to a desired goal. If possible, sit up straight or lean slightly forward. Try not to slouch. Hold the incentive spirometer in an upright position. INSTRUCTIONS FOR USE  Sit on the edge of your bed if possible, or sit up as far as you can in bed or on a chair. Hold the incentive spirometer in an upright position. Breathe out normally. Place the mouthpiece in your mouth and seal your lips tightly around it. Breathe in slowly and as deeply as possible, raising the piston or the ball toward the top of the column. Hold your  breath for 3-5 seconds or for as long as possible. Allow the piston or ball to fall to the bottom of the column. Remove the mouthpiece from your mouth and breathe out normally. Rest for a few seconds and repeat Steps 1 through 7 at least 10 times every 1-2 hours when you are awake. Take your time and take a few normal breaths between deep breaths. The spirometer may include an indicator to show your best effort. Use the indicator as a goal to work toward during each repetition. After each set of 10 deep breaths, practice coughing to be sure your lungs are clear. If you have an incision (the cut made at the time of surgery), support your incision when coughing by placing a pillow or  rolled up towels firmly against it. Once you are able to get out of bed, walk around indoors and cough well. You may stop using the incentive spirometer when instructed by your caregiver.  RISKS AND COMPLICATIONS Take your time so you do not get dizzy or light-headed. If you are in pain, you may need to take or ask for pain medication before doing incentive spirometry. It is harder to take a deep breath if you are having pain. AFTER USE Rest and breathe slowly and easily. It can be helpful to keep track of a log of your progress. Your caregiver can provide you with a simple table to help with this. If you are using the spirometer at home, follow these instructions: SEEK MEDICAL CARE IF:  You are having difficultly using the spirometer. You have trouble using the spirometer as often as instructed. Your pain medication is not giving enough relief while using the spirometer. You develop fever of 100.5 F (38.1 C) or higher. SEEK IMMEDIATE MEDICAL CARE IF:  You cough up bloody sputum that had not been present before. You develop fever of 102 F (38.9 C) or greater. You develop worsening pain at or near the incision site. MAKE SURE YOU:  Understand these instructions. Will watch your condition. Will get help right away if you are not doing well or get worse. Document Released: 04/25/2007 Document Revised: 03/06/2012 Document Reviewed: 06/26/2007 [facility] Patient Information 2014 [place], [place].   ________________________________________________________________________

## 2023-08-23 ENCOUNTER — Encounter (HOSPITAL_COMMUNITY)
Admission: RE | Admit: 2023-08-23 | Discharge: 2023-08-23 | Disposition: A | Discharge status: Home / Self Care | Payer: Medicare Other | Source: Ambulatory Visit | Attending: Orthopaedic Surgery | Admitting: Orthopaedic Surgery

## 2023-08-23 ENCOUNTER — Other Ambulatory Visit: Payer: Self-pay

## 2023-08-23 ENCOUNTER — Encounter (HOSPITAL_COMMUNITY): Payer: Self-pay

## 2023-08-23 VITALS — BP 148/78 | HR 59 | Temp 98.5°F | Resp 20 | Ht 63.0 in | Wt 231.8 lb

## 2023-08-23 DIAGNOSIS — I10 Essential (primary) hypertension: Secondary | ICD-10-CM | POA: Diagnosis not present

## 2023-08-23 DIAGNOSIS — Z01818 Encounter for other preprocedural examination: Secondary | ICD-10-CM

## 2023-08-23 DIAGNOSIS — Z01812 Encounter for preprocedural laboratory examination: Secondary | ICD-10-CM | POA: Diagnosis not present

## 2023-08-23 DIAGNOSIS — M1711 Unilateral primary osteoarthritis, right knee: Secondary | ICD-10-CM | POA: Diagnosis not present

## 2023-08-23 DIAGNOSIS — E119 Type 2 diabetes mellitus without complications: Secondary | ICD-10-CM | POA: Insufficient documentation

## 2023-08-23 HISTORY — DX: Anemia, unspecified: D64.9

## 2023-08-23 LAB — COMPREHENSIVE METABOLIC PANEL
ALT: 15 U/L (ref 0–44)
AST: 19 U/L (ref 15–41)
Albumin: 3.9 g/dL (ref 3.5–5.0)
Alkaline Phosphatase: 101 U/L (ref 38–126)
Anion gap: 9 (ref 5–15)
BUN: 18 mg/dL (ref 8–23)
CO2: 24 mmol/L (ref 22–32)
Calcium: 9.5 mg/dL (ref 8.9–10.3)
Chloride: 105 mmol/L (ref 98–111)
Creatinine, Ser: 0.77 mg/dL (ref 0.44–1.00)
GFR, Estimated: >60 mL/min (ref >60)
Glucose, Bld: 110 mg/dL — ABNORMAL HIGH (ref 70–99)
Potassium: 4.2 mmol/L (ref 3.5–5.1)
Sodium: 138 mmol/L (ref 135–145)
Total Bilirubin: 0.4 mg/dL (ref 0.3–1.2)
Total Protein: 7.1 g/dL (ref 6.5–8.1)

## 2023-08-23 LAB — CBC
HCT: 40.9 % (ref 36.0–46.0)
Hemoglobin: 12.7 g/dL (ref 12.0–15.0)
MCH: 25.6 pg — ABNORMAL LOW (ref 26.0–34.0)
MCHC: 31.1 g/dL (ref 30.0–36.0)
MCV: 82.3 fL (ref 80.0–100.0)
Platelets: 208 10*3/uL (ref 150–400)
RBC: 4.97 MIL/uL (ref 3.87–5.11)
RDW: 14.3 % (ref 11.5–15.5)
WBC: 7.9 10*3/uL (ref 4.0–10.5)
nRBC: 0 % (ref 0.0–0.2)

## 2023-08-23 LAB — HEMOGLOBIN A1C
Hgb A1c MFr Bld: 6.4 % — ABNORMAL HIGH (ref 4.8–5.6)
Mean Plasma Glucose: 136.98 mg/dL

## 2023-08-23 LAB — GLUCOSE, CAPILLARY: Glucose-Capillary: 109 mg/dL — ABNORMAL HIGH (ref 70–99)

## 2023-08-23 LAB — SURGICAL PCR SCREEN
MRSA, PCR: NEGATIVE
Staphylococcus aureus: NEGATIVE

## 2023-08-23 NOTE — Progress Notes (Signed)
COVID Vaccine Completed:  Yes  Date of COVID positive in last 90 days:  No  PCP - Caffie Damme, MD Cardiologist - N/A  Chest x-ray -  N/A EKG -  January 2024 at PCP Stress Test -  N/A ECHO -  N/A Cardiac Cath -  N/A Pacemaker/ICD device last checked: Spinal Cord Stimulator: N/A  Bowel Prep - N/A  Sleep Study - Yes, +sleep apnea CPAP - Yes  Fasting Blood Sugar - 100 to 102 Checks Blood Sugar - checks occasionally  Last dose of GLP1 agonist-  N/A GLP1 instructions:  N/A   Last dose of SGLT-2 inhibitors-  N/A SGLT-2 instructions: N/A  Blood Thinner Instructions:  n/A Aspirin Instructions: Last Dose:  Activity level:  Can go up a flight of stairs and perform activities of daily living without stopping and without symptoms of chest pain or shortness of breath.  Some limitations due to knee pain  Anesthesia review:  N/A  Patient denies shortness of breath, fever, cough and chest pain at PAT appointment  Patient verbalized understanding of instructions that were given to them at the PAT appointment. Patient was also instructed that they will need to review over the PAT instructions again at home before surgery.

## 2023-09-01 ENCOUNTER — Encounter (HOSPITAL_COMMUNITY): Payer: Self-pay | Admitting: Orthopaedic Surgery

## 2023-09-01 DIAGNOSIS — M1711 Unilateral primary osteoarthritis, right knee: Secondary | ICD-10-CM | POA: Insufficient documentation

## 2023-09-01 NOTE — H&P (Signed)
TOTAL KNEE ADMISSION H&P  Patient is being admitted for right total knee arthroplasty.  Subjective:  Chief Complaint:right knee pain.  HPI: Diana Bowers, 72 y.o. female, has a history of pain and functional disability in the right knee due to arthritis and has failed non-surgical conservative treatments for greater than 12 weeks to includeNSAID's and/or analgesics, corticosteriod injections, viscosupplementation injections, flexibility and strengthening excercises, use of assistive devices, and activity modification.  Onset of symptoms was gradual, starting 5 years ago with gradually worsening course since that time. The patient noted no past surgery on the right knee(s).  Patient currently rates pain in the right knee(s) at 10 out of 10 with activity. Patient has night pain, worsening of pain with activity and weight bearing, pain that interferes with activities of daily living, pain with passive range of motion, crepitus, and joint swelling.  Patient has evidence of subchondral sclerosis, periarticular osteophytes, and joint space narrowing by imaging studies. There is no active infection.  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 states migraines at times   Hypercholesteremia     Crestor   Hypertension     Past Surgical History:  Procedure Laterality Date   ANTERIOR CERVICAL DECOMP/DISCECTOMY FUSION  2006   CARPAL TUNNEL RELEASE  2000   "right" (01/01/2013)   CESAREAN SECTION  1978   COLONOSCOPY     ESOPHAGOGASTRODUODENOSCOPY     I & D KNEE WITH POLY EXCHANGE Left 09/15/2018   Procedure: POLY EXCHANGE LEFT KNEE,/,PATELLAR COMPONENT REVISION  STEROID INJECTION LEFT HIP;  Surgeon: Kathryne Hitch, MD;  Location: [facility];  Service: Orthopedics;  Laterality: Left;   KIDNEY DONATION  1994   KNEE ARTHROPLASTY  01/01/2013   Procedure: COMPUTER ASSISTED TOTAL KNEE ARTHROPLASTY;  Surgeon: Kerrin Champagne, MD;  Location: [facility] OR;  Service: Orthopedics;  Laterality: Left;  Left computer assisted total knee replacement   LIPOMA EXCISION  2000   back   REPLACEMENT TOTAL KNEE  01/01/2013   "left" (01/01/2013)   TOTAL HIP ARTHROPLASTY Right 07/22/2015   Procedure: RIGHT TOTAL HIP ARTHROPLASTY ANTERIOR APPROACH;  Surgeon: Kathryne Hitch, MD;  Location: [facility] OR;  Service: Orthopedics;  Laterality: Right;    No current facility-administered medications for this encounter.   Current Outpatient Medications  Medication Sig Dispense Refill Last Dose   cholecalciferol (VITAMIN D3) 25 MCG (1000 UNIT) tablet Take 1,000 Units by mouth daily.      CRESTOR 10 MG tablet Take 10 mg by mouth at bedtime.  2    diltiazem (CARDIZEM CD) 180 MG 24 hr capsule Take 180 mg by mouth in the morning and at bedtime.      loteprednol (LOTEMAX) 0.5 % ophthalmic suspension Place 1 drop into both eyes 2 (  two) times daily.      pantoprazole (PROTONIX) 40 MG tablet Take 40 mg by mouth at bedtime.  3    traMADol (ULTRAM) 50 MG tablet Take 1-2 tablets (50-100 mg total) by mouth 3 (three) times daily as needed. 30 tablet 0    Allergies  Allergen Reactions   Penicillins Hives    Has patient had a PCN reaction causing immediate rash, facial/tongue/throat swelling, SOB or lightheadedness with  hypotension:Yes Has patient had a PCN reaction causing severe rash involving mucus membranes or skin necrosis: No Has patient had a PCN reaction that required hospitalization: No Has patient had a PCN reaction occurring within the last 10 years: Unknown If all of the above answers are "NO", then may proceed with Cephalosporin use.     Social History   Tobacco Use   Smoking status: Never   Smokeless tobacco: Never  Substance Use Topics   Alcohol use: No    Family History  Problem Relation Age of Onset   Breast cancer Neg Hx      Review of Systems  Objective:  Physical Exam Vitals reviewed.  Constitutional:      Appearance: Normal appearance. She is normal weight.  HENT:     Head: Normocephalic and atraumatic.  Eyes:     Extraocular Movements: Extraocular movements intact.     Pupils: Pupils are equal, round, and reactive to light.  Cardiovascular:     Rate and Rhythm: Normal rate and regular rhythm.     Pulses: Normal pulses.  Pulmonary:     Effort: Pulmonary effort is normal.     Breath sounds: Normal breath sounds.  Abdominal:     Palpations: Abdomen is soft.  Musculoskeletal:     Cervical back: Normal range of motion and neck supple.     Right knee: Effusion, bony tenderness and crepitus present. Decreased range of motion. Tenderness present over the medial joint line and lateral joint line. Abnormal alignment.  Neurological:     Mental Status: She is alert and oriented to person, place, and time.  Psychiatric:        Behavior: Behavior normal.     Vital signs in last 24 hours:    Labs:   Estimated body mass index is 41.06 kg/m as calculated from the following:   Height as of 08/23/23: 5\' 3"  (1.6 m).   Weight as of 08/23/23: 105.1 kg.   Imaging Review Plain radiographs demonstrate severe degenerative joint disease of the right knee(s). The overall alignment ismild varus. The bone quality appears to be good for age and reported activity  level.      Assessment/Plan:  End stage arthritis, right knee   The patient history, physical examination, clinical judgment of the provider and imaging studies are consistent with end stage degenerative joint disease of the right knee(s) and total knee arthroplasty is deemed medically necessary. The treatment options including medical management, injection therapy arthroscopy and arthroplasty were discussed at length. The risks and benefits of total knee arthroplasty were presented and reviewed. The risks due to aseptic loosening, infection, stiffness, patella tracking problems, thromboembolic complications and other imponderables were discussed. The patient acknowledged the explanation, agreed to proceed with the plan and consent was signed. Patient is being admitted for inpatient treatment for surgery, pain control, PT, OT, prophylactic antibiotics, VTE prophylaxis, progressive ambulation and ADL's and discharge planning. The patient is planning to be discharged home with home health services

## 2023-09-02 ENCOUNTER — Ambulatory Visit (HOSPITAL_COMMUNITY): Payer: Medicare Other | Admitting: Anesthesiology

## 2023-09-02 ENCOUNTER — Encounter (HOSPITAL_COMMUNITY): Payer: Self-pay | Admitting: Orthopaedic Surgery

## 2023-09-02 ENCOUNTER — Inpatient Hospital Stay (HOSPITAL_COMMUNITY)
Admission: RE | Admit: 2023-09-02 | Discharge: 2023-09-06 | DRG: 470 | Disposition: A | Discharge status: Skilled Nursing Facility | Payer: Medicare Other | Attending: Orthopaedic Surgery | Admitting: Orthopaedic Surgery

## 2023-09-02 ENCOUNTER — Observation Stay (HOSPITAL_COMMUNITY): Payer: Medicare Other

## 2023-09-02 ENCOUNTER — Encounter (HOSPITAL_COMMUNITY)
Admission: RE | Disposition: A | Discharge status: Skilled Nursing Facility | Payer: Self-pay | Source: Home / Self Care | Attending: Orthopaedic Surgery

## 2023-09-02 ENCOUNTER — Other Ambulatory Visit: Payer: Self-pay

## 2023-09-02 DIAGNOSIS — M1711 Unilateral primary osteoarthritis, right knee: Principal | ICD-10-CM | POA: Diagnosis present

## 2023-09-02 DIAGNOSIS — E7800 Pure hypercholesterolemia, unspecified: Secondary | ICD-10-CM | POA: Diagnosis present

## 2023-09-02 DIAGNOSIS — I10 Essential (primary) hypertension: Secondary | ICD-10-CM | POA: Diagnosis present

## 2023-09-02 DIAGNOSIS — Z96641 Presence of right artificial hip joint: Secondary | ICD-10-CM | POA: Diagnosis present

## 2023-09-02 DIAGNOSIS — E669 Obesity, unspecified: Secondary | ICD-10-CM | POA: Diagnosis present

## 2023-09-02 DIAGNOSIS — Z86718 Personal history of other venous thrombosis and embolism: Secondary | ICD-10-CM

## 2023-09-02 DIAGNOSIS — Z96651 Presence of right artificial knee joint: Secondary | ICD-10-CM

## 2023-09-02 DIAGNOSIS — K219 Gastro-esophageal reflux disease without esophagitis: Secondary | ICD-10-CM | POA: Diagnosis present

## 2023-09-02 DIAGNOSIS — Z880 Allergy status to penicillin: Secondary | ICD-10-CM

## 2023-09-02 DIAGNOSIS — Z79899 Other long term (current) drug therapy: Secondary | ICD-10-CM

## 2023-09-02 DIAGNOSIS — Z01818 Encounter for other preprocedural examination: Secondary | ICD-10-CM

## 2023-09-02 DIAGNOSIS — E119 Type 2 diabetes mellitus without complications: Secondary | ICD-10-CM | POA: Diagnosis present

## 2023-09-02 DIAGNOSIS — Z6841 Body Mass Index (BMI) 40.0 and over, adult: Secondary | ICD-10-CM

## 2023-09-02 DIAGNOSIS — Z981 Arthrodesis status: Secondary | ICD-10-CM

## 2023-09-02 DIAGNOSIS — Z96659 Presence of unspecified artificial knee joint: Secondary | ICD-10-CM

## 2023-09-02 HISTORY — PX: TOTAL KNEE ARTHROPLASTY: SHX125

## 2023-09-02 LAB — GLUCOSE, CAPILLARY: Glucose-Capillary: 95 mg/dL (ref 70–99)

## 2023-09-02 SURGERY — ARTHROPLASTY, KNEE, TOTAL
Anesthesia: Spinal | Site: Knee | Laterality: Right

## 2023-09-02 MED ORDER — DILTIAZEM HCL ER COATED BEADS 180 MG PO CP24
180.0000 mg | ORAL_CAPSULE | Freq: Two times a day (BID) | ORAL | Status: DC
  Administered 2023-09-02 – 2023-09-06 (×8): 180 mg via ORAL
  Filled 2023-09-02 (×8): qty 1

## 2023-09-02 MED ORDER — ROPIVACAINE HCL 5 MG/ML IJ SOLN
INTRAMUSCULAR | Status: DC | PRN
Start: 2023-09-02 — End: 2023-09-02
  Administered 2023-09-02: 30 mL via PERINEURAL

## 2023-09-02 MED ORDER — OXYCODONE HCL 5 MG/5ML PO SOLN: 5.0000 mg | Freq: Once | ORAL | Status: DC | PRN

## 2023-09-02 MED ORDER — ONDANSETRON HCL 4 MG/2ML IJ SOLN: 4.0000 mg | Freq: Four times a day (QID) | INTRAMUSCULAR | Status: DC | PRN

## 2023-09-02 MED ORDER — SODIUM CHLORIDE 0.9 % IR SOLN
Status: DC | PRN
  Administered 2023-09-02: 1000 mL

## 2023-09-02 MED ORDER — ORAL CARE MOUTH RINSE: 15.0000 mL | Freq: Once | OROMUCOSAL | Status: AC

## 2023-09-02 MED ORDER — PROPOFOL 10 MG/ML IV BOLUS
INTRAVENOUS | Status: DC | PRN
  Administered 2023-09-02: 40 mg via INTRAVENOUS
  Administered 2023-09-02: 20 mg via INTRAVENOUS

## 2023-09-02 MED ORDER — SODIUM CHLORIDE 0.9 % IV SOLN: INTRAVENOUS | Status: DC

## 2023-09-02 MED ORDER — PANTOPRAZOLE SODIUM 40 MG PO TBEC
40.0000 mg | DELAYED_RELEASE_TABLET | Freq: Every day | ORAL | Status: DC
  Administered 2023-09-02 – 2023-09-05 (×4): 40 mg via ORAL
  Filled 2023-09-02 (×4): qty 1

## 2023-09-02 MED ORDER — TRANEXAMIC ACID-NACL 1000-0.7 MG/100ML-% IV SOLN
1000.0000 mg | INTRAVENOUS | Status: AC
  Administered 2023-09-02: 1000 mg via INTRAVENOUS
  Filled 2023-09-02: qty 100

## 2023-09-02 MED ORDER — DIPHENHYDRAMINE HCL 12.5 MG/5ML PO ELIX: 12.5000 mg | ORAL_SOLUTION | ORAL | Status: DC | PRN

## 2023-09-02 MED ORDER — OXYCODONE HCL 5 MG PO TABS
10.0000 mg | ORAL_TABLET | ORAL | Status: DC | PRN
  Administered 2023-09-03 (×2): 15 mg via ORAL
  Administered 2023-09-03: 10 mg via ORAL
  Administered 2023-09-03 – 2023-09-04 (×3): 15 mg via ORAL
  Administered 2023-09-04 – 2023-09-06 (×4): 10 mg via ORAL
  Filled 2023-09-02 (×2): qty 2
  Filled 2023-09-02: qty 3
  Filled 2023-09-02: qty 2
  Filled 2023-09-02: qty 3
  Filled 2023-09-02: qty 2
  Filled 2023-09-02 (×3): qty 3

## 2023-09-02 MED ORDER — CEFAZOLIN SODIUM-DEXTROSE 2-4 GM/100ML-% IV SOLN
2.0000 g | INTRAVENOUS | Status: AC
  Administered 2023-09-02: 2 g via INTRAVENOUS
  Filled 2023-09-02: qty 100

## 2023-09-02 MED ORDER — VITAMIN D 25 MCG (1000 UNIT) PO TABS
1000.0000 [IU] | ORAL_TABLET | Freq: Every day | ORAL | Status: DC
  Administered 2023-09-02 – 2023-09-06 (×5): 1000 [IU] via ORAL
  Filled 2023-09-02 (×5): qty 1

## 2023-09-02 MED ORDER — BUPIVACAINE-EPINEPHRINE 0.25% -1:200000 IJ SOLN
INTRAMUSCULAR | Status: DC | PRN
  Administered 2023-09-02: 30 mL

## 2023-09-02 MED ORDER — ACETAMINOPHEN 325 MG PO TABS
325.0000 mg | ORAL_TABLET | Freq: Four times a day (QID) | ORAL | Status: DC | PRN
  Administered 2023-09-03: 650 mg via ORAL
  Filled 2023-09-02: qty 2

## 2023-09-02 MED ORDER — DOCUSATE SODIUM 100 MG PO CAPS
100.0000 mg | ORAL_CAPSULE | Freq: Two times a day (BID) | ORAL | Status: DC
  Administered 2023-09-02 – 2023-09-06 (×8): 100 mg via ORAL
  Filled 2023-09-02 (×8): qty 1

## 2023-09-02 MED ORDER — BUPIVACAINE-EPINEPHRINE 0.25% -1:200000 IJ SOLN
INTRAMUSCULAR | Status: AC
  Filled 2023-09-02: qty 1

## 2023-09-02 MED ORDER — OXYCODONE HCL 5 MG PO TABS: 5.0000 mg | ORAL_TABLET | Freq: Once | ORAL | Status: DC | PRN

## 2023-09-02 MED ORDER — PROPOFOL 500 MG/50ML IV EMUL
INTRAVENOUS | Status: DC | PRN
  Administered 2023-09-02: 100 ug/kg/min via INTRAVENOUS

## 2023-09-02 MED ORDER — ONDANSETRON HCL 4 MG/2ML IJ SOLN
INTRAMUSCULAR | Status: DC | PRN
  Administered 2023-09-02: 4 mg via INTRAVENOUS

## 2023-09-02 MED ORDER — OXYCODONE HCL 5 MG PO TABS
ORAL_TABLET | ORAL | Status: AC
  Filled 2023-09-02: qty 1

## 2023-09-02 MED ORDER — LOTEPREDNOL ETABONATE 0.5 % OP SUSP
1.0000 [drp] | Freq: Two times a day (BID) | OPHTHALMIC | Status: DC
  Administered 2023-09-02 – 2023-09-06 (×8): 1 [drp] via OPHTHALMIC
  Filled 2023-09-02: qty 5

## 2023-09-02 MED ORDER — METOCLOPRAMIDE HCL 5 MG PO TABS: 5.0000 mg | ORAL_TABLET | Freq: Three times a day (TID) | ORAL | Status: DC | PRN

## 2023-09-02 MED ORDER — HYDROMORPHONE HCL 1 MG/ML IJ SOLN
0.5000 mg | INTRAMUSCULAR | Status: DC | PRN
  Administered 2023-09-02 (×2): 1 mg via INTRAVENOUS
  Filled 2023-09-02: qty 1

## 2023-09-02 MED ORDER — PHENOL 1.4 % MT LIQD: 1.0000 | OROMUCOSAL | Status: DC | PRN

## 2023-09-02 MED ORDER — METHOCARBAMOL 500 MG IVPB - SIMPLE MED
500.0000 mg | Freq: Four times a day (QID) | INTRAVENOUS | Status: DC | PRN
  Administered 2023-09-02: 500 mg via INTRAVENOUS

## 2023-09-02 MED ORDER — METHOCARBAMOL 500 MG PO TABS
500.0000 mg | ORAL_TABLET | Freq: Four times a day (QID) | ORAL | Status: DC | PRN
  Administered 2023-09-03 (×3): 500 mg via ORAL
  Filled 2023-09-02 (×3): qty 1

## 2023-09-02 MED ORDER — MIDAZOLAM HCL 2 MG/2ML IJ SOLN: 2.0000 mg | Freq: Once | INTRAMUSCULAR | Status: DC

## 2023-09-02 MED ORDER — OXYCODONE HCL 5 MG PO TABS
5.0000 mg | ORAL_TABLET | ORAL | Status: DC | PRN
  Administered 2023-09-02 (×2): 5 mg via ORAL
  Administered 2023-09-05: 10 mg via ORAL
  Filled 2023-09-02: qty 1
  Filled 2023-09-02 (×2): qty 2

## 2023-09-02 MED ORDER — DEXAMETHASONE SODIUM PHOSPHATE 10 MG/ML IJ SOLN
INTRAMUSCULAR | Status: DC | PRN
  Administered 2023-09-02: 4 mg via INTRAVENOUS

## 2023-09-02 MED ORDER — BUPIVACAINE IN DEXTROSE 0.75-8.25 % IT SOLN
INTRATHECAL | Status: DC | PRN
  Administered 2023-09-02: 1.6 mL via INTRATHECAL

## 2023-09-02 MED ORDER — METHOCARBAMOL 500 MG IVPB - SIMPLE MED
INTRAVENOUS | Status: AC
  Filled 2023-09-02: qty 55

## 2023-09-02 MED ORDER — ASPIRIN 81 MG PO CHEW
81.0000 mg | CHEWABLE_TABLET | Freq: Two times a day (BID) | ORAL | Status: DC
  Administered 2023-09-02 – 2023-09-06 (×8): 81 mg via ORAL
  Filled 2023-09-02 (×8): qty 1

## 2023-09-02 MED ORDER — MIDAZOLAM HCL 2 MG/2ML IJ SOLN
INTRAMUSCULAR | Status: AC
  Filled 2023-09-02: qty 2

## 2023-09-02 MED ORDER — FENTANYL CITRATE PF 50 MCG/ML IJ SOSY
100.0000 ug | PREFILLED_SYRINGE | Freq: Once | INTRAMUSCULAR | Status: AC
  Administered 2023-09-02: 50 ug via INTRAVENOUS
  Filled 2023-09-02: qty 2

## 2023-09-02 MED ORDER — HYDROMORPHONE HCL 1 MG/ML IJ SOLN
INTRAMUSCULAR | Status: AC
  Filled 2023-09-02: qty 1

## 2023-09-02 MED ORDER — STERILE WATER FOR IRRIGATION IR SOLN
Status: DC | PRN
Start: 2023-09-02 — End: 2023-09-02
  Administered 2023-09-02: 2000 mL

## 2023-09-02 MED ORDER — MENTHOL 3 MG MT LOZG: 1.0000 | LOZENGE | OROMUCOSAL | Status: DC | PRN

## 2023-09-02 MED ORDER — CHLORHEXIDINE GLUCONATE 0.12 % MT SOLN
15.0000 mL | Freq: Once | OROMUCOSAL | Status: AC
  Administered 2023-09-02: 15 mL via OROMUCOSAL

## 2023-09-02 MED ORDER — CEFAZOLIN SODIUM-DEXTROSE 1-4 GM/50ML-% IV SOLN
1.0000 g | Freq: Four times a day (QID) | INTRAVENOUS | Status: AC
  Administered 2023-09-02 (×2): 1 g via INTRAVENOUS
  Filled 2023-09-02 (×2): qty 50

## 2023-09-02 MED ORDER — MIDAZOLAM HCL 5 MG/5ML IJ SOLN
INTRAMUSCULAR | Status: DC | PRN
  Administered 2023-09-02 (×2): 1 mg via INTRAVENOUS

## 2023-09-02 MED ORDER — 0.9 % SODIUM CHLORIDE (POUR BTL) OPTIME
TOPICAL | Status: DC | PRN
  Administered 2023-09-02: 1000 mL

## 2023-09-02 MED ORDER — INFLUENZA VAC A&B SURF ANT ADJ 0.5 ML IM SUSY: 0.5000 mL | PREFILLED_SYRINGE | INTRAMUSCULAR | Status: DC | PRN

## 2023-09-02 MED ORDER — PHENYLEPHRINE HCL-NACL 20-0.9 MG/250ML-% IV SOLN
INTRAVENOUS | Status: DC | PRN
  Administered 2023-09-02: 25 ug/min via INTRAVENOUS

## 2023-09-02 MED ORDER — METOCLOPRAMIDE HCL 5 MG/ML IJ SOLN: 5.0000 mg | Freq: Three times a day (TID) | INTRAMUSCULAR | Status: DC | PRN

## 2023-09-02 MED ORDER — ONDANSETRON HCL 4 MG/2ML IJ SOLN: 4.0000 mg | Freq: Once | INTRAMUSCULAR | Status: DC | PRN

## 2023-09-02 MED ORDER — LIDOCAINE 2% (20 MG/ML) 5 ML SYRINGE
INTRAMUSCULAR | Status: DC | PRN
  Administered 2023-09-02: 50 mg via INTRAVENOUS

## 2023-09-02 MED ORDER — HYDROMORPHONE HCL 1 MG/ML IJ SOLN: 0.2500 mg | INTRAMUSCULAR | Status: DC | PRN

## 2023-09-02 MED ORDER — ONDANSETRON HCL 4 MG PO TABS: 4.0000 mg | ORAL_TABLET | Freq: Four times a day (QID) | ORAL | Status: DC | PRN

## 2023-09-02 MED ORDER — ALUM & MAG HYDROXIDE-SIMETH 200-200-20 MG/5ML PO SUSP: 30.0000 mL | ORAL | Status: DC | PRN

## 2023-09-02 MED ORDER — ROSUVASTATIN CALCIUM 10 MG PO TABS
10.0000 mg | ORAL_TABLET | Freq: Every day | ORAL | Status: DC
  Administered 2023-09-02 – 2023-09-05 (×4): 10 mg via ORAL
  Filled 2023-09-02 (×4): qty 1

## 2023-09-02 MED ORDER — LACTATED RINGERS IV SOLN: INTRAVENOUS | Status: DC

## 2023-09-02 SURGICAL SUPPLY — 61 items
APL SKNCLS STERI-STRIP NONHPOA (GAUZE/BANDAGES/DRESSINGS)
BAG COUNTER SPONGE SURGICOUNT (BAG) IMPLANT
BAG SPEC THK2 15X12 ZIP CLS (MISCELLANEOUS) ×1
BAG SPNG CNTER NS LX DISP (BAG)
BAG ZIPLOCK 12X15 (MISCELLANEOUS) ×1 IMPLANT
BENZOIN TINCTURE PRP APPL 2/3 (GAUZE/BANDAGES/DRESSINGS) IMPLANT
BLADE SAG 18X100X1.27 (BLADE) ×1 IMPLANT
BLADE SURG SZ10 CARB STEEL (BLADE) ×2 IMPLANT
BNDG CMPR 6 X 5 YARDS HK CLSR (GAUZE/BANDAGES/DRESSINGS) ×2
BNDG ELASTIC 6INX 5YD STR LF (GAUZE/BANDAGES/DRESSINGS) ×2 IMPLANT
BOWL SMART MIX CTS (DISPOSABLE) IMPLANT
CEMENT BONE R 1X40 (Cement) IMPLANT
CEMENT BONE SIMPLEX SPEEDSET (Cement) IMPLANT
COMP TIB PS KNEE E 0D RT (Joint) ×1 IMPLANT
COMPONET TIB PS KNEE E 0D RT (Joint) IMPLANT
COOLER ICEMAN CLASSIC (MISCELLANEOUS) ×1 IMPLANT
COVER SURGICAL LIGHT HANDLE (MISCELLANEOUS) ×1 IMPLANT
CUFF TOURN SGL QUICK 34 (TOURNIQUET CUFF) ×1
CUFF TRNQT CYL 34X4.125X (TOURNIQUET CUFF) ×1 IMPLANT
DRAPE INCISE IOBAN 66X45 STRL (DRAPES) ×1 IMPLANT
DRAPE U-SHAPE 47X51 STRL (DRAPES) ×1 IMPLANT
DURAPREP 26ML APPLICATOR (WOUND CARE) ×1 IMPLANT
ELECT BLADE TIP CTD 4 INCH (ELECTRODE) ×1 IMPLANT
ELECT REM PT RETURN 15FT ADLT (MISCELLANEOUS) ×1 IMPLANT
FEMUR CMT CR STD SZ 8 RT KNEE (Joint) ×1 IMPLANT
FEMUR CMTD CR STD SZ 8 RT KNEE (Joint) IMPLANT
GAUZE PAD ABD 8X10 STRL (GAUZE/BANDAGES/DRESSINGS) ×2 IMPLANT
GAUZE SPONGE 4X4 12PLY STRL (GAUZE/BANDAGES/DRESSINGS) ×1 IMPLANT
GAUZE XEROFORM 1X8 LF (GAUZE/BANDAGES/DRESSINGS) IMPLANT
GLOVE BIO SURGEON STRL SZ7.5 (GLOVE) ×1 IMPLANT
GLOVE BIOGEL PI IND STRL 8 (GLOVE) ×2 IMPLANT
GLOVE ECLIPSE 8.0 STRL XLNG CF (GLOVE) ×1 IMPLANT
GOWN STRL REUS W/ TWL XL LVL3 (GOWN DISPOSABLE) ×2 IMPLANT
GOWN STRL REUS W/TWL XL LVL3 (GOWN DISPOSABLE) ×2
HANDPIECE INTERPULSE COAX TIP (DISPOSABLE) ×1
HOLDER FOLEY CATH W/STRAP (MISCELLANEOUS) IMPLANT
IMMOBILIZER KNEE 20 (SOFTGOODS) ×1
IMMOBILIZER KNEE 20 THIGH 36 (SOFTGOODS) ×1 IMPLANT
INSERT TIB ARTISURF SZ8-11X12 (Insert) IMPLANT
KIT TURNOVER KIT A (KITS) IMPLANT
NS IRRIG 1000ML POUR BTL (IV SOLUTION) ×1 IMPLANT
PACK TOTAL KNEE CUSTOM (KITS) ×1 IMPLANT
PAD COLD SHLDR WRAP-ON (PAD) ×1 IMPLANT
PADDING CAST COTTON 6X4 STRL (CAST SUPPLIES) ×2 IMPLANT
PIN DRILL HDLS TROCAR 75 4PK (PIN) IMPLANT
PROTECTOR NERVE ULNAR (MISCELLANEOUS) ×1 IMPLANT
SCREW FEMALE HEX FIX 25X2.5 (ORTHOPEDIC DISPOSABLE SUPPLIES) IMPLANT
SET HNDPC FAN SPRY TIP SCT (DISPOSABLE) ×1 IMPLANT
SET PAD KNEE POSITIONER (MISCELLANEOUS) ×1 IMPLANT
SPIKE FLUID TRANSFER (MISCELLANEOUS) IMPLANT
STAPLER VISISTAT 35W (STAPLE) IMPLANT
STEM POLY PAT PLY 29M KNEE (Knees) IMPLANT
STRIP CLOSURE SKIN 1/2X4 (GAUZE/BANDAGES/DRESSINGS) IMPLANT
SUT MNCRL AB 4-0 PS2 18 (SUTURE) IMPLANT
SUT VIC AB 0 CT1 27 (SUTURE) ×2
SUT VIC AB 0 CT1 27XBRD ANTBC (SUTURE) ×1 IMPLANT
SUT VIC AB 1 CT1 36 (SUTURE) ×2 IMPLANT
SUT VIC AB 2-0 CT1 27 (SUTURE) ×2
SUT VIC AB 2-0 CT1 TAPERPNT 27 (SUTURE) ×2 IMPLANT
TRAY FOLEY MTR SLVR 16FR STAT (SET/KITS/TRAYS/PACK) IMPLANT
WATER STERILE IRR 1000ML POUR (IV SOLUTION) ×2 IMPLANT

## 2023-09-02 NOTE — Anesthesia Procedure Notes (Addendum)
Anesthesia Regional Block: Adductor canal block   Pre-Anesthetic Checklist: , timeout performed,  Correct Patient, Correct Site, Correct Laterality,  Correct Procedure, Correct Position, site marked,  Risks and benefits discussed,  Surgical consent,  Pre-op evaluation,  At surgeon's request and post-op pain management  Laterality: Right  Prep: chloraprep       Needles:  Injection technique: Single-shot  Needle Type: Echogenic Stimulator Needle     Needle Length: 10cm  Needle Gauge: 21   Needle insertion depth: 8 cm   Additional Needles:   Procedures:,,,, ultrasound used (permanent image in chart),,    Narrative:  Start time: 09/02/2023 11:05 AM End time: 09/02/2023 11:10 AM Injection made incrementally with aspirations every 5 mL.  Performed by: Personally  Anesthesiologist: Mal Amabile, MD  Additional Notes: Timeout performed. Patient sedated. Relevant anatomy ID'd using [place]. Incremental 2-80ml injection of LA with frequent aspiration. Patient tolerated procedure well.

## 2023-09-02 NOTE — Op Note (Signed)
Operative Note  Date of operation: 09/02/2023 Preoperative diagnosis: Right knee primary osteoarthritis Postoperative diagnosis: Same  Procedure: Right cemented total knee arthroplasty  Implants: Biomet/Zimmer persona cemented knee system Implant Name Type Inv. Item Serial No. Manufacturer Lot No. LRB No. Used Action  CEMENT BONE R 1X40 - [id number] Cement CEMENT BONE R 1X40  ZIMMER RECON(ORTH,TRAU,BIO,SG) [id number] Right 2 Implanted  INSERT TIB ARTISURF [id number] - [id number] Insert INSERT TIB ARTISURF [id number]  ZIMMER RECON(ORTH,TRAU,BIO,SG) [id number] Right 1 Implanted  FEMUR CMT CR STD SZ 8 RT KNEE - [id number] Joint FEMUR CMT CR STD SZ 8 RT KNEE  ZIMMER RECON(ORTH,TRAU,BIO,SG) [id number] Right 1 Implanted  COMP TIB PS KNEE E 0D RT - [id number] Joint COMP TIB PS KNEE E 0D RT  ZIMMER RECON(ORTH,TRAU,BIO,SG) [id number] Right 1 Implanted  STEM POLY PAT PLY 76M KNEE - [id number] Knees STEM POLY PAT PLY 76M KNEE  ZIMMER RECON(ORTH,TRAU,BIO,SG) [id number] Right 1 Implanted   Surgeon: Vanita Panda. Magnus Ivan, MD Assistant: Rexene Edison, PA-C  Anesthesia: #1 right lower extremity adductor canal block, #2 spinal, #3 local Tourniquet time: Under 1 hour Blood loss: Under 100 cc Antibiotics: IV Ancef Complications: None  Indications: The patient is a 73 year old female well-known to me.  She has a history of a left knee replacement and her right hip replacement.  She has now developed significant arthritis of her right knee.  At this point her right knee pain is daily and it is detrimentally affecting her mobility, her quality of life and her actives daily living.  This is gotten worse over the last 12 months and she has tried and failed conservative treatment and wishes to proceed with a knee replacement.  Having had this before she is fully aware of the risk of acute blood loss anemia, nerve vessel injury, fracture, infection, DVT, implant failure and wound healing issues.  She knows all of these are  heightened given her BMI of over 40.  She understands our goals are decreased pain, improve mobility and overall proved quality of life.  Procedure description: After informed consent was obtained and the appropriate right knee was marked, anesthesia obtained a right lower extremity adductor canal block in the holding room.  The patient was then brought to the operating room and set up on the operating table where spinal anesthesia was obtained.  She was laid in supine position on the operating table and a Foley catheter was placed.  A nonsterile tourniquet is placed around her upper right thigh and her right thigh, knee, leg and ankle were prepped and draped with DuraPrep and sterile drapes including a sterile stockinette.  A timeout was called and she was then applied as a correct patient the correct right knee.  An Esmarch was then used to wrap out the leg and the tourniquet was inflated to 300 mm of pressure.  With the knee extended we made a direct midline incision over the patella and carried this proximally and distally.  Dissection was carried down to the knee joint and a medial parapatellar arthrotomy was made finding a large joint effusion.  With the knee in a flexed position we found complete cartilage wear of the patellofemoral joint and the medial compartment the knee.  Osteophytes removed from all 3 compartments as well as remnants of the ACL PCL and medial and lateral meniscus.  Using an extramedullary cutting guide we made our proximal tibia cut correction for varus and valgus using an extramedullary guide to take 2 mm off the low side.  We made this  cut for a 7 degree slope.  We made the cut without difficulty.  We then went to the femur and used a intramedullary guide for distal femoral cut setting this for right knee at 5 degrees externally rotated and a 10 mm distal femoral cut.  We made that cut without difficulty and brought the knee back down to full extension and had achieved full extension  with a 10 mm block.  We then went back to the femur and put the femoral sizing guide based off the epicondylar axis.  Based off of this we chose a size 8 femur.  We put a 4-in-1 cutting block for a size 8 femur and made our anterior and posterior cuts followed by her chamfer cuts.  We then went back to the tibia and chose a size E right tibial tray for coverage over the tibial plateau setting the rotation over the tibial tubercle and the femur.  We made our keel punch and drill hole off of this.  We then trialed the size E right tibia followed by our size 8 right CR standard femur.  We placed a 10 mm right medial congruent polyethylene insert and went up to a 12 mm insert.  We are pleased with range of motion and stability without 12 insert.  We then made a patella cut and drilled 3 holes for a size 29 patella button.  Again with all trial instrumentation the knee we are pleased with range of motion and stability.  We then removed all transportation from the knee and irrigate the knee with normal saline solution.  We then placed Marcaine with epinephrine around the arthrotomy.  The cement was then mixed in with the knee in a flexed position we cemented our Biomet Zimmer persona tibial tray for right knee size E followed by cementing our size 8 right CR standard femur.  We placed our 12 mm medial congruent right polythene insert and cemented our size 29 patella button.  We then held the knee fully extended and compressed while the cement hardened.  Once that it hardened with the tourniquet down and hemostasis was obtained electrocautery.  We then closed the arthrotomy with interrupted #1 Vicryl suture followed by 0 Vicryl to close the deep tissue and 2-0 Vicryl to close the subcutaneous tissue.  The skin was closed with staples.  Well-padded sterile dressing was applied.  The patient was taken to recovery room in stable condition.  Rexene Edison, PA-C did assist during the entire case and assistance was crucial and  medically necessary for soft tissue management and retraction, helping guide implant placement and a layered closure of the wound.

## 2023-09-02 NOTE — Transfer of Care (Signed)
Immediate Anesthesia Transfer of Care Note  Patient: Diana Bowers  Procedure(s) Performed: Procedure(s): RIGHT TOTAL KNEE ARTHROPLASTY (Right)  Patient Location: PACU  Anesthesia Type:Spinal  Level of Consciousness:  sedated, patient cooperative and responds to stimulation  Airway & Oxygen Therapy:Patient Spontanous Breathing and Patient connected to face mask oxgen  Post-op Assessment:  Report given to PACU RN and Post -op Vital signs reviewed and stable  Post vital signs:  Reviewed and stable  Last Vitals:  Vitals:   09/02/23 1120 09/02/23 1125  BP: (!) 172/89 (!) 181/94  Pulse: (!) 59 61  Resp: 18 20  Temp:    SpO2: 100% 100%    Complications: No apparent anesthesia complications

## 2023-09-02 NOTE — Anesthesia Procedure Notes (Signed)
Spinal  Patient location during procedure: OR Start time: 09/02/2023 11:40 AM End time: 09/02/2023 11:43 AM Reason for block: surgical anesthesia Staffing Performed: anesthesiologist  Anesthesiologist: Mal Amabile, MD Performed by: Mal Amabile, MD Authorized by: Mal Amabile, MD   Preanesthetic Checklist Completed: patient identified, IV checked, site marked, risks and benefits discussed, surgical consent, monitors and equipment checked, pre-op evaluation and timeout performed Spinal Block Patient position: sitting Prep: DuraPrep and site prepped and draped Patient monitoring: heart rate, cardiac monitor, continuous pulse ox and blood pressure Approach: midline Location: L3-4 Injection technique: single-shot Needle Needle type: Pencan  Needle gauge: 24 G Needle length: 9 cm Needle insertion depth: 7 cm Assessment Sensory level: T6 Events: CSF return Additional Notes Patient tolerated procedure well. Adequate sensory level.

## 2023-09-02 NOTE — Interval H&P Note (Signed)
History and Physical Interval Note: The patient understands that she is here today for a right total knee replacement to treat her severe right knee arthritis.  There has been no acute or interval change in her medical status.  The risks and benefits of surgery been discussed in detail and informed consent has been obtained.  The right operative knee has been marked.  09/02/2023 10:25 AM  Diana Bowers  has presented today for surgery, with the diagnosis of osteoarthritis right knee.  The various methods of treatment have been discussed with the patient and family. After consideration of risks, benefits and other options for treatment, the patient has consented to  Procedure(s): RIGHT TOTAL KNEE ARTHROPLASTY (Right) as a surgical intervention.  The patient's history has been reviewed, patient examined, no change in status, stable for surgery.  I have reviewed the patient's chart and labs.  Questions were answered to the patient's satisfaction.     Kathryne Hitch
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
## 2023-09-02 NOTE — Plan of Care (Signed)

## 2023-09-02 NOTE — Anesthesia Postprocedure Evaluation (Signed)
Anesthesia Post Note  Patient: Diana Bowers  Procedure(s) Performed: RIGHT TOTAL KNEE ARTHROPLASTY (Right: Knee)     Patient location during evaluation: PACU Anesthesia Type: Spinal Level of consciousness: oriented and awake and alert Pain management: pain level controlled Vital Signs Assessment: post-procedure vital signs reviewed and stable Respiratory status: spontaneous breathing, nonlabored ventilation and respiratory function stable Cardiovascular status: blood pressure returned to baseline and stable Postop Assessment: no headache, no backache, no apparent nausea or vomiting and spinal receding Anesthetic complications: no   No notable events documented.  Last Vitals:  Vitals:   09/02/23 1400 09/02/23 1415  BP: 113/61 121/63  Pulse: 65 (!) 58  Resp: 18 16  Temp:  36.9 C  SpO2: 94% 94%    Last Pain:  Vitals:   09/02/23 1419  TempSrc:   PainSc: 7                  [name]

## 2023-09-02 NOTE — Anesthesia Preprocedure Evaluation (Signed)
Anesthesia Evaluation  Patient identified by MRN, date of birth, ID band Patient awake    Reviewed: Allergy & Precautions, NPO status , Patient's Chart, lab work & pertinent test results  Airway Mallampati: II  TM Distance: >3 FB Neck ROM: Full    Dental no notable dental hx. (+) Teeth Intact, Caps, Dental Advisory Given   Pulmonary neg pulmonary ROS   Pulmonary exam normal breath sounds clear to auscultation       Cardiovascular hypertension, Pt. on medications Normal cardiovascular exam Rhythm:Regular Rate:Normal     Neuro/Psych  Headaches  negative psych ROS   GI/Hepatic Neg liver ROS,GERD  Medicated,,  Endo/Other  diabetes, Well Controlled, Type 2  Morbid obesityHyperlipidemia  Renal/GU negative Renal ROSSolitary kidney S/P donation  negative genitourinary   Musculoskeletal  (+) Arthritis , Osteoarthritis,  OA right knee   Abdominal  (+) + obese  Peds  Hematology  (+) Blood dyscrasia, anemia   Anesthesia Other Findings   Reproductive/Obstetrics                              Anesthesia Physical Anesthesia Plan  ASA: 3  Anesthesia Plan: Spinal   Post-op Pain Management: Minimal or no pain anticipated and Dilaudid IV   Induction:   PONV Risk Score and Plan: 3 and Treatment may vary due to age or medical condition and Propofol infusion  Airway Management Planned: Natural Airway and Simple Face Mask  Additional Equipment:   Intra-op Plan:   Post-operative Plan: Extubation in OR  Informed Consent: I have reviewed the patients History and Physical, chart, labs and discussed the procedure including the risks, benefits and alternatives for the proposed anesthesia with the patient or authorized representative who has indicated his/her understanding and acceptance.     Dental advisory given  Plan Discussed with: CRNA and Anesthesiologist  Anesthesia Plan Comments:           Anesthesia Quick Evaluation

## 2023-09-03 DIAGNOSIS — I10 Essential (primary) hypertension: Secondary | ICD-10-CM | POA: Diagnosis present

## 2023-09-03 DIAGNOSIS — E119 Type 2 diabetes mellitus without complications: Secondary | ICD-10-CM | POA: Diagnosis present

## 2023-09-03 DIAGNOSIS — Z6841 Body Mass Index (BMI) 40.0 and over, adult: Secondary | ICD-10-CM | POA: Diagnosis not present

## 2023-09-03 DIAGNOSIS — Z981 Arthrodesis status: Secondary | ICD-10-CM | POA: Diagnosis not present

## 2023-09-03 DIAGNOSIS — E669 Obesity, unspecified: Secondary | ICD-10-CM | POA: Diagnosis present

## 2023-09-03 DIAGNOSIS — Z86718 Personal history of other venous thrombosis and embolism: Secondary | ICD-10-CM | POA: Diagnosis not present

## 2023-09-03 DIAGNOSIS — Z96659 Presence of unspecified artificial knee joint: Secondary | ICD-10-CM

## 2023-09-03 DIAGNOSIS — E7800 Pure hypercholesterolemia, unspecified: Secondary | ICD-10-CM | POA: Diagnosis present

## 2023-09-03 DIAGNOSIS — Z79899 Other long term (current) drug therapy: Secondary | ICD-10-CM | POA: Diagnosis not present

## 2023-09-03 DIAGNOSIS — Z880 Allergy status to penicillin: Secondary | ICD-10-CM | POA: Diagnosis not present

## 2023-09-03 DIAGNOSIS — M1711 Unilateral primary osteoarthritis, right knee: Secondary | ICD-10-CM | POA: Diagnosis present

## 2023-09-03 DIAGNOSIS — Z96641 Presence of right artificial hip joint: Secondary | ICD-10-CM | POA: Diagnosis present

## 2023-09-03 DIAGNOSIS — K219 Gastro-esophageal reflux disease without esophagitis: Secondary | ICD-10-CM | POA: Diagnosis present

## 2023-09-03 LAB — BASIC METABOLIC PANEL
Anion gap: 9 (ref 5–15)
BUN: 12 mg/dL (ref 8–23)
CO2: 24 mmol/L (ref 22–32)
Calcium: 8.9 mg/dL (ref 8.9–10.3)
Chloride: 103 mmol/L (ref 98–111)
Creatinine, Ser: 0.89 mg/dL (ref 0.44–1.00)
GFR, Estimated: >60 mL/min (ref >60)
Glucose, Bld: 141 mg/dL — ABNORMAL HIGH (ref 70–99)
Potassium: 4.3 mmol/L (ref 3.5–5.1)
Sodium: 136 mmol/L (ref 135–145)

## 2023-09-03 LAB — CBC
HCT: 37.3 % (ref 36.0–46.0)
Hemoglobin: 11.9 g/dL — ABNORMAL LOW (ref 12.0–15.0)
MCH: 26.2 pg (ref 26.0–34.0)
MCHC: 31.9 g/dL (ref 30.0–36.0)
MCV: 82 fL (ref 80.0–100.0)
Platelets: 192 10*3/uL (ref 150–400)
RBC: 4.55 MIL/uL (ref 3.87–5.11)
RDW: 14 % (ref 11.5–15.5)
WBC: 11.8 10*3/uL — ABNORMAL HIGH (ref 4.0–10.5)
nRBC: 0 % (ref 0.0–0.2)

## 2023-09-03 MED ORDER — KETOROLAC TROMETHAMINE 15 MG/ML IJ SOLN
7.5000 mg | Freq: Four times a day (QID) | INTRAMUSCULAR | Status: AC
  Administered 2023-09-03 (×3): 7.5 mg via INTRAVENOUS
  Filled 2023-09-03 (×3): qty 1

## 2023-09-03 NOTE — TOC Initial Note (Addendum)
Transition of Care [facility]) - Initial/Assessment Note    Patient Details  Name: Diana Bowers MRN: [id number] Date of Birth: 11/09/1951  Transition of [facility]) CM/SW Contact:    Georgie Chard, LCSW Phone Number: 09/03/2023, 11:28 AM  Clinical Narrative:                 CSW spoke to patient at bedside. Patient has agreed to SNF. CSW explained process patient also reported going through process before. Patient has requested [name] and is aware there is no guarantee. This CSW has made [facility] aware to review patient, patient reports being at the facility in the past. This CSW will start process in sending patient out through the [facility]. TOC will continue to follow.   Addend@ 3:24 pm  Patient is now IP will start SNF as PT evaluation recommends SNF. Please contact [facility] to review patient as well.    Expected Discharge Plan: Skilled Nursing Facility Barriers to Discharge: No Barriers Identified   Patient Goals and CMS Choice Patient states their goals for this hospitalization and ongoing recovery are:: would like to go to SNF has requested [facility] Medicare.gov Compare Post Acute Care list provided to:: Patient Choice offered to / list presented to : Patient      Expected Discharge Plan and Services       Living arrangements for the past 2 months: Single Family Home                                      Prior Living Arrangements/Services Living arrangements for the past 2 months: Single Family Home Lives with:: Spouse   Do you feel safe going back to the place where you live?: Yes      Need for Family Participation in Patient Care: No (Comment) Care giver support system in place?: Yes (comment)   Criminal Activity/Legal Involvement Pertinent to Current Situation/Hospitalization: No - Comment as needed  Activities of Daily Living Home Assistive Devices/Equipment: Eyeglasses, Dan Humphreys (specify type) ADL Screening (condition at time of admission) Patient's  cognitive ability adequate to safely complete daily activities?: Yes Is the patient deaf or have difficulty hearing?: No Does the patient have difficulty seeing, even when wearing glasses/contacts?: No Does the patient have difficulty concentrating, remembering, or making decisions?: No Patient able to express need for assistance with ADLs?: Yes Does the patient have difficulty dressing or bathing?: No Independently performs ADLs?: Yes (appropriate for developmental age) Does the patient have difficulty walking or climbing stairs?: Yes Weakness of Legs: Right Weakness of Arms/Hands: None  Permission Sought/Granted                  Emotional Assessment Appearance:: Appears stated age   Affect (typically observed): Accepting Orientation: : Oriented to Self, Oriented to Place, Oriented to  Time, Oriented to Situation Alcohol / Substance Use: Not Applicable Psych Involvement: No (comment)  Admission diagnosis:  Status post total right knee replacement [Z96.651] Patient Active Problem List   Diagnosis Date Noted   Status post total right knee replacement 09/02/2023   Unilateral primary osteoarthritis, right knee 09/01/2023   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 [place] - [address] [place] [address] Phone: [phone] Fax: [phone]     Social Determinants of Health (SDOH) Social History: SDOH Screenings   Food Insecurity: No Food Insecurity (09/02/2023)  Housing: Low Risk  (09/02/2023)  Transportation Needs: No Transportation Needs (09/02/2023)  Utilities: Not At Risk (09/02/2023)  Tobacco Use: Low  Risk  (09/02/2023)   SDOH Interventions:     Readmission Risk Interventions     No data to display
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
## 2023-09-03 NOTE — Discharge Instructions (Signed)

## 2023-09-03 NOTE — Plan of Care (Signed)

## 2023-09-03 NOTE — Progress Notes (Signed)
Subjective: 1 Day Post-Op Procedure(s) (LRB): RIGHT TOTAL KNEE ARTHROPLASTY (Right) Patient reports pain as moderate.    Objective: Vital signs in last 24 hours: Temp:  [97.4 F (36.3 C)-99.8 F (37.7 C)] 99.8 F (37.7 C) (09/07 0839) Pulse Rate:  [57-89] 89 (09/07 0839) Resp:  [13-25] 17 (09/07 0839) BP: (100-182)/(60-95) 155/63 (09/07 0839) SpO2:  [91 %-100 %] 91 % (09/07 0839)  Intake/Output from previous day: 09/06 0701 - 09/07 0700 In: 1896.5 [P.O.:360; I.V.:1459.4; IV Piggyback:77.1] Out: 3050 [Urine:3000; Blood:50] Intake/Output this shift: Total I/O In: 240 [P.O.:240] Out: -   Recent Labs    09/03/23 0352  HGB 11.9*   Recent Labs    09/03/23 0352  WBC 11.8*  RBC 4.55  HCT 37.3  PLT 192   Recent Labs    09/03/23 0352  NA 136  K 4.3  CL 103  CO2 24  BUN 12  CREATININE 0.89  GLUCOSE 141*  CALCIUM 8.9   No results for input(s): "LABPT", "INR" in the last 72 hours.  Sensation intact distally Intact pulses distally Dorsiflexion/Plantar flexion intact Incision: dressing C/D/I Compartment soft   Assessment/Plan: 1 Day Post-Op Procedure(s) (LRB): RIGHT TOTAL KNEE ARTHROPLASTY (Right) Up with therapy Discharge to SNF early next week      Diana Bowers 09/03/2023, 10:32 AM

## 2023-09-03 NOTE — Plan of Care (Signed)
  Problem: Education: °Goal: Knowledge of the prescribed therapeutic regimen will improve °Outcome: Progressing °Goal: Individualized Educational Video(s) °Outcome: Progressing °  °Problem: Activity: °Goal: Ability to avoid complications of mobility impairment will improve °Outcome: Progressing °Goal: Range of joint motion will improve °Outcome: Progressing °  °Problem: Clinical Measurements: °Goal: Postoperative complications will be avoided or minimized °Outcome: Progressing °  °

## 2023-09-04 NOTE — Plan of Care (Signed)
  Problem: Activity: Goal: Range of joint motion will improve Outcome: Progressing   Problem: Pain Management: Goal: Pain level will decrease with appropriate interventions Outcome: Progressing   Problem: Safety: Goal: Ability to remain free from injury will improve Outcome: Progressing   

## 2023-09-04 NOTE — NC FL2 (Signed)
[place] MEDICAID FL2 LEVEL OF CARE FORM     IDENTIFICATION  Patient Name: Diana Bowers Birthdate: 1951-12-13 Sex: female Admission Date (Current Location): 09/02/2023  [facility] and [place] Number:  Producer, television/film/video and Address:  [facility],  [address] [place], [place] [address]      Provider Number: [id number]  Attending Physician Name and Address:  Kathryne Hitch,*  Relative Name and Phone Number:  [place], [place] [phone]  [phone]    Current Level of Care: Hospital Recommended Level of Care: Skilled Nursing Facility Prior Approval Number:    Date Approved/Denied:   PASRR Number: [id number]  Discharge Plan: SNF    Current Diagnoses: Patient Active Problem List   Diagnosis Date Noted   S/P total knee replacement 09/03/2023   Status post total right knee replacement 09/02/2023   Unilateral primary osteoarthritis, right knee 09/01/2023   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   Status post revision of total replacement of left knee 09/15/2018   Trochanteric bursitis, left hip 08/10/2017   Osteoarthritis of right hip 07/22/2015   Status post total replacement of right hip 07/22/2015   Acute blood loss anemia 01/04/2013    Orientation RESPIRATION BLADDER Height & Weight     Self, Time, Situation, Place  Normal Continent Weight: 231 lb 12.8 oz (105.1 kg) Height:  5\' 3"  (160 cm)  BEHAVIORAL SYMPTOMS/MOOD NEUROLOGICAL BOWEL NUTRITION STATUS      Continent Diet  AMBULATORY STATUS COMMUNICATION OF NEEDS Skin   Limited Assist Verbally Surgical wounds                       Personal Care Assistance Level of Assistance  Bathing, Feeding, Dressing Bathing Assistance: Limited assistance Feeding assistance: Independent Dressing Assistance: Limited assistance     Functional Limitations Info  Sight, Speech, Hearing Sight Info: Adequate Hearing Info: Adequate Speech Info: Adequate    SPECIAL CARE  FACTORS FREQUENCY  OT (By licensed OT), PT (By licensed PT)     PT Frequency: Minimum 5x a week OT Frequency: Minimum 5x a week            Contractures Contractures Info: Not present    Additional Factors Info  Code Status, Allergies Code Status Info: Full Code Allergies Info: Penicillins           Current Medications (09/04/2023):  This is the current hospital active medication list Current Facility-Administered Medications  Medication Dose Route Frequency Provider Last Rate Last Admin   0.9 %  sodium chloride infusion   Intravenous Continuous Kathryne Hitch, MD   Stopped at 09/03/23 0846   acetaminophen (TYLENOL) tablet 325-650 mg  325-650 mg Oral Q6H PRN Kathryne Hitch, MD   650 mg at 09/03/23 1256   alum & mag hydroxide-simeth (MAALOX/MYLANTA) 200-200-20 MG/5ML suspension 30 mL  30 mL Oral Q4H PRN Kathryne Hitch, MD       aspirin chewable tablet 81 mg  81 mg Oral BID Kathryne Hitch, MD   81 mg at 09/04/23 1041   cholecalciferol (VITAMIN D3) 25 MCG (1000 UNIT) tablet 1,000 Units  1,000 Units Oral Daily Kathryne Hitch, MD   1,000 Units at 09/04/23 1041   diltiazem (CARDIZEM CD) 24 hr capsule 180 mg  180 mg Oral BID Kathryne Hitch, MD   180 mg at 09/04/23 1041   diphenhydrAMINE (BENADRYL) 12.5 MG/5ML elixir 12.5-25 mg  12.5-25 mg Oral Q4H PRN Kathryne Hitch, MD  docusate sodium (COLACE) capsule 100 mg  100 mg Oral BID Kathryne Hitch, MD   100 mg at 09/04/23 1042   HYDROmorphone (DILAUDID) injection 0.5-1 mg  0.5-1 mg Intravenous Q4H PRN Kathryne Hitch, MD   1 mg at 09/02/23 1807   influenza vaccine adjuvanted (FLUAD) injection 0.5 mL  0.5 mL Intramuscular Prior to discharge Kathryne Hitch, MD       loteprednol (LOTEMAX) 0.5 % ophthalmic suspension 1 drop  1 drop Both Eyes BID Kathryne Hitch, MD   1 drop at 09/04/23 1000   menthol-cetylpyridinium (CEPACOL) lozenge 3 mg  1 lozenge  Oral PRN Kathryne Hitch, MD       Or   phenol (CHLORASEPTIC) mouth spray 1 spray  1 spray Mouth/Throat PRN Kathryne Hitch, MD       methocarbamol (ROBAXIN) tablet 500 mg  500 mg Oral Q6H PRN Kathryne Hitch, MD   500 mg at 09/03/23 1256   Or   methocarbamol (ROBAXIN) 500 mg in dextrose 5 % 50 mL IVPB  500 mg Intravenous Q6H PRN Kathryne Hitch, MD   Stopped at 09/02/23 1622   metoCLOPramide (REGLAN) tablet 5-10 mg  5-10 mg Oral Q8H PRN Kathryne Hitch, MD       Or   metoCLOPramide (REGLAN) injection 5-10 mg  5-10 mg Intravenous Q8H PRN Kathryne Hitch, MD       ondansetron [facility]) tablet 4 mg  4 mg Oral Q6H PRN Kathryne Hitch, MD       Or   ondansetron [facility]) injection 4 mg  4 mg Intravenous Q6H PRN Kathryne Hitch, MD       oxyCODONE (Oxy IR/ROXICODONE) immediate release tablet 10-15 mg  10-15 mg Oral Q4H PRN Kathryne Hitch, MD   15 mg at 09/04/23 1042   oxyCODONE (Oxy IR/ROXICODONE) immediate release tablet 5-10 mg  5-10 mg Oral Q4H PRN Kathryne Hitch, MD   5 mg at 09/02/23 1456   pantoprazole (PROTONIX) EC tablet 40 mg  40 mg Oral QHS Kathryne Hitch, MD   40 mg at 09/03/23 2109   rosuvastatin (CRESTOR) tablet 10 mg  10 mg Oral QHS Kathryne Hitch, MD   10 mg at 09/03/23 2109     Discharge Medications: Please see discharge summary for a list of discharge medications.  Relevant Imaging Results:  Relevant Lab Results:   Additional Information SSN [id number]  Darleene Cleaver, LCSW

## 2023-09-04 NOTE — Plan of Care (Signed)
  Problem: Education: Goal: Knowledge of the prescribed therapeutic regimen will improve Outcome: Progressing   Problem: Activity: Goal: Range of joint motion will improve Outcome: Progressing   Problem: Pain Management: Goal: Pain level will decrease with appropriate interventions Outcome: Progressing   Problem: Clinical Measurements: Goal: Ability to maintain clinical measurements within normal limits will improve Outcome: Progressing   Problem: Safety: Goal: Ability to remain free from injury will improve Outcome: Progressing   

## 2023-09-04 NOTE — TOC Progression Note (Addendum)
Transition of Care [facility]) - Progression Note    Patient Details  Name: Diana Bowers MRN: [id number] Date of Birth: 1951/12/19  Transition of [facility]) CM/SW Contact  Darleene Cleaver, [place] Phone Number: 09/04/2023, 4:48 PM  Clinical Narrative:     Patient would like to go to SNF for short term rehab.  This CSW has sent clinicals to SNFs and began bed search.  CSW awaiting for bed offers.  Expected Discharge Plan: Skilled Nursing Facility Barriers to Discharge: No Barriers Identified  Expected Discharge Plan and Services  SNF for short term rehab.     Living arrangements for the past 2 months: Single Family Home                                       Social Determinants of Health (SDOH) Interventions SDOH Screenings   Food Insecurity: No Food Insecurity (09/02/2023)  Housing: Low Risk  (09/02/2023)  Transportation Needs: No Transportation Needs (09/02/2023)  Utilities: Not At Risk (09/02/2023)  Tobacco Use: Low Risk  (09/02/2023)    Readmission Risk Interventions     No data to display

## 2023-09-04 NOTE — Progress Notes (Signed)
   09/03/23 2230  BiPAP/CPAP/SIPAP  $ Non-Invasive Home Ventilator  Initial  BiPAP/CPAP/SIPAP Pt Type Adult  BiPAP/CPAP/SIPAP Resmed  Mask Type  (pts home mask)  Respiratory Rate 20 breaths/min  EPAP 4 cmH2O  FiO2 (%) 21 %  Patient Home Equipment No  Auto Titrate No

## 2023-09-04 NOTE — Progress Notes (Signed)
  Subjective: Patient stable.  She was able to walk around in the room yesterday.  Pain is controlled.  Sitting up comfortably in the bed this morning   Objective: Vital signs in last 24 hours: Temp:  [98.8 F (37.1 C)-99.8 F (37.7 C)] 99.1 F (37.3 C) (09/08 0512) Pulse Rate:  [73-89] 76 (09/08 0512) Resp:  [16-18] 17 (09/08 0512) BP: (117-181)/(59-90) 117/59 (09/08 0512) SpO2:  [91 %-96 %] 93 % (09/08 0512) FiO2 (%):  [21 %] 21 % (09/07 2230)  Intake/Output from previous day: 09/07 0701 - 09/08 0700 In: 2015.2 [P.O.:960; I.V.:1055.2] Out: -  Intake/Output this shift: No intake/output data recorded.  Exam:  Sensation intact distally Intact pulses distally Dorsiflexion/Plantar flexion intact  Labs: Recent Labs    09/03/23 0352  HGB 11.9*   Recent Labs    09/03/23 0352  WBC 11.8*  RBC 4.55  HCT 37.3  PLT 192   Recent Labs    09/03/23 0352  NA 136  K 4.3  CL 103  CO2 24  BUN 12  CREATININE 0.89  GLUCOSE 141*  CALCIUM 8.9   No results for input(s): "LABPT", "INR" in the last 72 hours.  Assessment/Plan: Plan at this time is continue with physical therapy and mobilization today.  Okay to plan on skilled nursing likely tomorrow based on her appearance today.   G Scott Calder Oblinger 09/04/2023, 8:25 AM

## 2023-09-04 NOTE — Progress Notes (Signed)
   09/04/23 2318  BiPAP/CPAP/SIPAP  BiPAP/CPAP/SIPAP Pt Type Adult  BiPAP/CPAP/SIPAP Resmed  EPAP 4 cmH2O  FiO2 (%) 21 %  Patient Home Equipment No  Auto Titrate No
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
## 2023-09-04 NOTE — Progress Notes (Signed)
Physical Therapy Treatment Patient Details Name: Diana Bowers MRN: [id number] DOB: 1951-12-26 Today's Date: 09/04/2023   History of Present Illness 72 yo female presents to therapy s/p R TKA on 09/02/2023 due to failure of conservative measures. Pt PMH includes but is not limited to: L TKA revision (2019), R THA (2016), anemia, DM II, L LE DV, GERD, ACDF (2006),  HTN and HLD.    PT Comments  Pt very cooperative and progressing steadily with mobility.  Pt performed therex program with assist and up to ambulate increased distance in hall with noted improvement in stability.     If plan is discharge home, recommend the following: A little help with walking and/or transfers;A little help with bathing/dressing/bathroom;Assistance with cooking/housework;Assist for transportation;Help with stairs or ramp for entrance   Can travel by private vehicle     No  Equipment Recommendations  None recommended by PT    Recommendations for Other Services       Precautions / Restrictions Precautions Precautions: Knee;Fall Restrictions Weight Bearing Restrictions: No RLE Weight Bearing: Weight bearing as tolerated     Mobility  Bed Mobility Overal bed mobility: Needs Assistance Bed Mobility: Supine to Sit     Supine to sit: Min assist, HOB elevated, Used rails     General bed mobility comments: min a for R LE to EOB and cues    Transfers Overall transfer level: Needs assistance Equipment used: Rolling walker (2 wheels) Transfers: Sit to/from Stand Sit to Stand: Min assist, From elevated surface           General transfer comment: cues and increased time    Ambulation/Gait Ambulation/Gait assistance: Min assist, Contact guard assist Gait Distance (Feet): 58 Feet Assistive device: Rolling walker (2 wheels) Gait Pattern/deviations: Step-to pattern, Decreased step length - right, Decreased step length - left, Shuffle, Trunk flexed Gait velocity: decreased     General Gait Details:  increased time with cues for sequence, posture and position from RW, distance ltd by pain/fatigue   Stairs             Wheelchair Mobility     Tilt Bed    Modified Rankin (Stroke Patients Only)       Balance Overall balance assessment: Needs assistance Sitting-balance support: Feet supported Sitting balance-Leahy Scale: Good     Standing balance support: During functional activity, Reliant on assistive device for balance, Single extremity supported Standing balance-Leahy Scale: Poor                              Cognition Arousal: Alert Behavior During Therapy: WFL for tasks assessed/performed Overall Cognitive Status: Within Functional Limits for tasks assessed                                          Exercises Total Joint Exercises Ankle Circles/Pumps: AROM, Both, 20 reps Quad Sets: AROM, Both, 10 reps, Supine Heel Slides: AAROM, Right, Supine, 20 reps Straight Leg Raises: AAROM, Right, Supine, 20 reps    General Comments        Pertinent Vitals/Pain Pain Assessment Pain Assessment: 0-10 Pain Score: 6  Pain Location: R LE/knee Pain Descriptors / Indicators: Aching, Burning, Operative site guarding, Sore Pain Intervention(s): Limited activity within patient's tolerance, Monitored during session, Premedicated before session, Ice applied    Home Living  Prior Function            PT Goals (current goals can now be found in the care plan section) Acute Rehab PT Goals Patient Stated Goal: to be able to attend a wedding in November, resume walking program and return to water areobics class PT Goal Formulation: With patient Time For Goal Achievement: 09/16/23 Potential to Achieve Goals: Good Progress towards PT goals: Progressing toward goals    Frequency    7X/week      PT Plan      Co-evaluation              AM-PAC PT "6 Clicks" Mobility   Outcome Measure  Help needed  turning from your back to your side while in a flat bed without using bedrails?: A Little Help needed moving from lying on your back to sitting on the side of a flat bed without using bedrails?: A Little Help needed moving to and from a bed to a chair (including a wheelchair)?: A Little Help needed standing up from a chair using your arms (e.g., wheelchair or bedside chair)?: A Little Help needed to walk in hospital room?: A Little Help needed climbing 3-5 steps with a railing? : Total 6 Click Score: 16    End of Session Equipment Utilized During Treatment: Gait belt;Right knee immobilizer Activity Tolerance: Patient tolerated treatment well;Patient limited by pain;Patient limited by fatigue Patient left: in chair;with call bell/phone within reach;with chair alarm set Nurse Communication: Mobility status PT Visit Diagnosis: Unsteadiness on feet (R26.81);Other abnormalities of gait and mobility (R26.89);Muscle weakness (generalized) (M62.81);Pain;Difficulty in walking, not elsewhere classified (R26.2) Pain - Right/Left: Right Pain - part of body: Knee;Leg     Time: 0920-0953 PT Time Calculation (min) (ACUTE ONLY): 33 min  Charges:    $Gait Training: 8-22 mins $Therapeutic Exercise: 8-22 mins PT General Charges $$ ACUTE PT VISIT: 1 Visit                     Mauro Kaufmann PT Acute Rehabilitation Services Pager [phone] Office [phone]    Diana Bowers 09/04/2023, 12:20 PM

## 2023-09-04 NOTE — Plan of Care (Signed)

## 2023-09-05 ENCOUNTER — Encounter (HOSPITAL_COMMUNITY): Payer: Self-pay | Admitting: Orthopaedic Surgery

## 2023-09-05 MED ORDER — METHOCARBAMOL 500 MG PO TABS: 500.0000 mg | ORAL_TABLET | Freq: Four times a day (QID) | ORAL | 0 refills | Status: DC | PRN

## 2023-09-05 MED ORDER — OXYCODONE HCL 5 MG PO TABS: 5.0000 mg | ORAL_TABLET | ORAL | 0 refills | Status: DC | PRN

## 2023-09-05 MED ORDER — ASPIRIN 81 MG PO CHEW: 81.0000 mg | CHEWABLE_TABLET | Freq: Two times a day (BID) | ORAL | 0 refills | Status: DC

## 2023-09-05 NOTE — Progress Notes (Signed)
Patient ID: Diana Bowers, female   DOB: 05/30/1951, 72 y.o.   MRN: [id number] The patient is awake and alert this morning.  Her right operative knee is stable.  I did place a new dressing.  Her calf is soft.  Her vital signs are stable as well.  An FL 2 has been signed on the chart.  She is awaiting short-term skilled nursing home placement.  I will put in discharge orders at prescriptions on the chart for in case this happens today.

## 2023-09-05 NOTE — TOC Progression Note (Signed)
Transition of Care [facility]) - Progression Note   Patient Details  Name: Diana Bowers MRN: [id number] Date of Birth: February 10, 1951  [facility] CM/SW Contact  Ewing Schlein, LCSW Phone Number: 09/05/2023, 11:06 AM  Clinical Narrative: CSW provided patient with bed offers and Medicare star ratings. Patient chose [facility]. CSW confirmed bed with [facility] in admissions. Patient can be admitted to the facility tomorrow. CSW updated Dr. Magnus Ivan. TOC to follow.   Expected Discharge Plan: Skilled Nursing Facility Barriers to Discharge: No Barriers Identified  Expected Discharge Plan and Services Living arrangements for the past 2 months: Single Family Home Expected Discharge Date: 09/05/23                Social Determinants of Health (SDOH) Interventions SDOH Screenings   Food Insecurity: No Food Insecurity (09/02/2023)  Housing: Low Risk  (09/02/2023)  Transportation Needs: No Transportation Needs (09/02/2023)  Utilities: Not At Risk (09/02/2023)  Tobacco Use: Low Risk  (09/02/2023)   Readmission Risk Interventions     No data to display

## 2023-09-05 NOTE — Progress Notes (Signed)
Physical Therapy Treatment Patient Details Name: Diana Bowers MRN: [id number] DOB: 12/24/1951 Today's Date: 09/05/2023   History of Present Illness 72 yo female presents to therapy s/p R TKA on 09/02/2023 due to failure of conservative measures. Pt PMH includes but is not limited to: L TKA revision (2019), R THA (2016), anemia, DM II, L LE DV, GERD, ACDF (2006),  HTN and HLD.    PT Comments  Pt agreeable to therapy. Moderate pain with activity-made RN aware at end of session of pt's request for pain meds. [facility]. Will continue to follow and progress activity as tolerated.     If plan is discharge home, recommend the following: A little help with walking and/or transfers;A little help with bathing/dressing/bathroom;Assistance with cooking/housework;Assist for transportation;Help with stairs or ramp for entrance   Can travel by private vehicle        Equipment Recommendations  None recommended by PT    Recommendations for Other Services       Precautions / Restrictions Precautions Precautions: Knee;Fall Restrictions Weight Bearing Restrictions: No RLE Weight Bearing: Weight bearing as tolerated     Mobility  Bed Mobility               General bed mobility comments: oob in recliner    Transfers Overall transfer level: Needs assistance Equipment used: Rolling walker (2 wheels) Transfers: Sit to/from Stand Sit to Stand: Contact guard assist           General transfer comment: cues and increased time. close guard for safety    Ambulation/Gait Ambulation/Gait assistance: Min assist Gait Distance (Feet): 75 Feet Assistive device: Rolling walker (2 wheels) Gait Pattern/deviations: Step-to pattern, Decreased step length - right, Decreased step length - left, Shuffle, Trunk flexed       General Gait Details: Intermittent assist to steady. 3 brief standing rest breaks during walk. Cues for safety, posture, RW proximity. Distance limited by pain,  fatigue   Stairs             Wheelchair Mobility     Tilt Bed    Modified Rankin (Stroke Patients Only)       Balance Overall balance assessment: Needs assistance         Standing balance support: During functional activity, Reliant on assistive device for balance Standing balance-Leahy Scale: Poor                              Cognition Arousal: Alert Behavior During Therapy: WFL for tasks assessed/performed Overall Cognitive Status: Within Functional Limits for tasks assessed                                          Exercises Total Joint Exercises Ankle Circles/Pumps: AROM, Both, 10 reps Quad Sets: AROM, Right, 10 reps Straight Leg Raises: AAROM, Right, 10 reps Knee Flexion: AROM, Right, 10 reps, Seated Goniometric ROM: ~10-50 degrees    General Comments        Pertinent Vitals/Pain Pain Assessment Pain Assessment: 0-10 Pain Score: 8  Pain Location: R thigh/knee Pain Descriptors / Indicators: Aching, Burning, Operative site guarding, Sore Pain Intervention(s): Limited activity within patient's tolerance, Monitored during session, Ice applied, Repositioned    Home Living  Prior Function            PT Goals (current goals can now be found in the care plan section) Progress towards PT goals: Progressing toward goals    Frequency    7X/week      PT Plan      Co-evaluation              AM-PAC PT "6 Clicks" Mobility   Outcome Measure  Help needed turning from your back to your side while in a flat bed without using bedrails?: A Little Help needed moving from lying on your back to sitting on the side of a flat bed without using bedrails?: A Little Help needed moving to and from a bed to a chair (including a wheelchair)?: A Little Help needed standing up from a chair using your arms (e.g., wheelchair or bedside chair)?: A Little Help needed to walk in hospital room?: A  Little Help needed climbing 3-5 steps with a railing? : A Lot 6 Click Score: 17    End of Session Equipment Utilized During Treatment: Gait belt Activity Tolerance: Patient tolerated treatment well;Patient limited by pain;Patient limited by fatigue Patient left: in chair;with call bell/phone within reach   PT Visit Diagnosis: Unsteadiness on feet (R26.81);Other abnormalities of gait and mobility (R26.89);Muscle weakness (generalized) (M62.81);Pain;Difficulty in walking, not elsewhere classified (R26.2) Pain - Right/Left: Right Pain - part of body: Knee     Time: 1610-9604 PT Time Calculation (min) (ACUTE ONLY): 24 min  Charges:    $Gait Training: 8-22 mins $Therapeutic Exercise: 8-22 mins PT General Charges $$ ACUTE PT VISIT: 1 Visit                         Alexismarie Ramsay, PT Acute Rehabilitation  Office: [phone]

## 2023-09-05 NOTE — Discharge Summary (Signed)
Patient ID: CHANTEE MUMMEY MRN: [id number] DOB/AGE: December 07, 1951 72 y.o.  Admit date: 09/02/2023 Discharge date: 09/05/2023  Admission Diagnoses:  Principal Problem:   Unilateral primary osteoarthritis, right knee Active Problems:   Status post total right knee replacement   S/P total knee replacement   Discharge Diagnoses:  Same  Past Medical History:  Diagnosis Date   Anemia    Arthritis    "left knee" (01/01/2013)   Diabetes mellitus without complication (HCC)    borderline no med   DVT of lower extremity (deep venous thrombosis) (HCC) 12/27/2005   "LLE; from birth control pill" (01/01/2013)   GERD (gastroesophageal reflux disease)    Headache    pt. states migraines at times   Hypercholesteremia    Crestor   Hypertension     Surgeries: Procedure(s): RIGHT TOTAL KNEE ARTHROPLASTY on 09/02/2023   Consultants:   Discharged Condition: Improved  Hospital Course: TANYLA GRADNEY is an 72 y.o. female who was admitted 09/02/2023 for operative treatment ofUnilateral primary osteoarthritis, right knee. Patient has severe unremitting pain that affects sleep, daily activities, and work/hobbies. After pre-op clearance the patient was taken to the operating room on 09/02/2023 and underwent  Procedure(s): RIGHT TOTAL KNEE ARTHROPLASTY.    Patient was given perioperative antibiotics:  Anti-infectives (From admission, onward)    Start     Dose/Rate Route Frequency Ordered Stop   09/02/23 1800  ceFAZolin (ANCEF) IVPB 1 g/50 mL premix        1 g 100 mL/hr over 30 Minutes Intravenous Every 6 hours 09/02/23 1611 09/03/23 1425   09/02/23 0845  ceFAZolin (ANCEF) IVPB 2g/100 mL premix        2 g 200 mL/hr over 30 Minutes Intravenous On call to O.R. 09/02/23 4403 09/02/23 1144        Patient was given sequential compression devices, early ambulation, and chemoprophylaxis to prevent DVT.  Patient benefited maximally from hospital stay and there were no complications.    Recent vital signs: Patient  Vitals for the past 24 hrs:  BP Temp Pulse Resp SpO2  09/05/23 0708 133/61 -- 87 18 99 %  09/04/23 2128 (!) 155/77 -- 93 18 93 %  09/04/23 1253 132/66 98.7 F (37.1 C) 81 17 97 %     Recent laboratory studies:  Recent Labs    09/03/23 0352  WBC 11.8*  HGB 11.9*  HCT 37.3  PLT 192  NA 136  K 4.3  CL 103  CO2 24  BUN 12  CREATININE 0.89  GLUCOSE 141*  CALCIUM 8.9     Discharge Medications:   Allergies as of 09/05/2023       Reactions   Penicillins Hives   Has patient had a PCN reaction causing immediate rash, facial/tongue/throat swelling, SOB or lightheadedness with hypotension:Yes Has patient had a PCN reaction causing severe rash involving mucus membranes or skin necrosis: No Has patient had a PCN reaction that required hospitalization: No Has patient had a PCN reaction occurring within the last 10 years: Unknown If all of the above answers are "NO", then may proceed with Cephalosporin use.        Medication List     STOP taking these medications    traMADol 50 MG tablet Commonly known as: ULTRAM       TAKE these medications    aspirin 81 MG chewable tablet Chew 1 tablet (81 mg total) by mouth 2 (two) times daily.   cholecalciferol 25 MCG (1000 UNIT) tablet Commonly known as: VITAMIN  D3 Take 1,000 Units by mouth daily.   Crestor 10 MG tablet Generic drug: rosuvastatin Take 10 mg by mouth at bedtime.   diltiazem 180 MG 24 hr capsule Commonly known as: CARDIZEM CD Take 180 mg by mouth in the morning and at bedtime.   loteprednol 0.5 % ophthalmic suspension Commonly known as: LOTEMAX Place 1 drop into both eyes 2 (two) times daily.   methocarbamol 500 MG tablet Commonly known as: ROBAXIN Take 1 tablet (500 mg total) by mouth every 6 (six) hours as needed for muscle spasms.   oxyCODONE 5 MG immediate release tablet Commonly known as: Oxy IR/ROXICODONE Take 1-2 tablets (5-10 mg total) by mouth every 4 (four) hours as needed for moderate pain  (pain score 4-6).   pantoprazole 40 MG tablet Commonly known as: PROTONIX Take 40 mg by mouth at bedtime.               Durable Medical Equipment  (From admission, onward)           Start     Ordered   09/02/23 1612  DME 3 n 1  Once        09/02/23 1611   09/02/23 1612  DME Walker rolling  Once       Question Answer Comment  Walker: With 5 Inch Wheels   Patient needs a walker to treat with the following condition Status post total right knee replacement      09/02/23 1611            Diagnostic Studies: DG Knee Right Port  Result Date: 09/02/2023 CLINICAL DATA:  Right knee arthroplasty. EXAM: PORTABLE RIGHT KNEE - 1-2 VIEW COMPARISON:  None Available. FINDINGS: Right knee arthroplasty in expected alignment. No periprosthetic lucency or fracture. There has been patellar resurfacing. Recent postsurgical change includes air and edema in the soft tissues and joint space. Anterior skin staples in place. IMPRESSION: Right knee arthroplasty without immediate postoperative complication. Electronically Signed   By: Narda Rutherford M.D.   On: 09/02/2023 15:56    Disposition: Discharge disposition: 03-Skilled Nursing Facility          Follow-up Information     Kathryne Hitch, MD Follow up in 2 week(s).   Specialty: Orthopedic Surgery Contact information: [address] [place] [address] [phone]                  Signed: Kathryne Hitch 09/05/2023, 7:28 AM

## 2023-09-05 NOTE — Progress Notes (Signed)
   09/05/23 2300  BiPAP/CPAP/SIPAP  $ Non-Invasive Home Ventilator  Subsequent   Pt. Able to place on indepedently, made aware to notify.

## 2023-09-06 NOTE — Progress Notes (Signed)
D/C packet given to pt, to give to staff at [facility]. Attempted to call report X 2 w/o success, pt d/c via w/c  w all belongings in stable condition.

## 2023-09-06 NOTE — Progress Notes (Signed)
PT Cancellation Note  Patient Details Name: Diana Bowers MRN: [id number] DOB: 29-Oct-1951   Cancelled Treatment:    Reason Eval/Treat Not Completed:  Pt declined to participate with PT at this time, reports she is getting ready to d/c.    Clella Ramsay, PT Acute Rehabilitation  Office: [phone]

## 2023-09-06 NOTE — Plan of Care (Signed)
  Problem: Education: Goal: Knowledge of the prescribed therapeutic regimen will improve Outcome: Progressing Goal: Individualized Educational Video(s) Outcome: Progressing   

## 2023-09-06 NOTE — Progress Notes (Signed)
Patient ID: Diana Bowers, female   DOB: 11/16/1951, 72 y.o.   MRN: [id number] The patient is awake and alert this morning.  Her vital signs are stable and her right operative knee is stable.  She can be discharged to skilled nursing today.

## 2023-09-06 NOTE — TOC Transition Note (Signed)
Transition of Care [facility]) - CM/SW Discharge Note  Patient Details  Name: Diana Bowers MRN: [id number] Date of Birth: 1951-09-17  [facility]) CM/SW Contact:  Ewing Schlein, LCSW Phone Number: 09/06/2023, 10:37 AM  Clinical Narrative: Patient is medically stable for discharge to [facility]. Discharge summary, discharge orders, and SNF transfer report faxed to facility in hub. [facility] in admissions confirmed patient's bed will be ready at 11am and can be admitted at that time. Patient updated regarding admission and reported she will call her husband to transport her. Discharge packet completed. RN updated. TOC signing off.    Final next level of care: Skilled Nursing Facility Barriers to Discharge: No Barriers Identified  Patient Goals and CMS Choice CMS Medicare.gov Compare Post Acute Care list provided to:: Patient Choice offered to / list presented to : Patient  Discharge Placement Existing PASRR number confirmed : 09/04/23          Patient chooses bed at: [facility] Patient to be transferred to facility by: Family Patient and family notified of of transfer: 09/06/23  Discharge Plan and Services Additional resources added to the After Visit Summary for     DME Arranged: N/A DME Agency: NA  Social Determinants of Health (SDOH) Interventions SDOH Screenings   Food Insecurity: No Food Insecurity (09/02/2023)  Housing: Low Risk  (09/02/2023)  Transportation Needs: No Transportation Needs (09/02/2023)  Utilities: Not At Risk (09/02/2023)  Tobacco Use: Low Risk  (09/02/2023)   Readmission Risk Interventions     No data to display

## 2023-09-06 NOTE — Discharge Summary (Signed)
Patient ID: JOMARI EVEN MRN: [id number] DOB/AGE: 1951-07-05 72 y.o.  Admit date: 09/02/2023 Discharge date: 09/06/2023  Admission Diagnoses:  Principal Problem:   Unilateral primary osteoarthritis, right knee Active Problems:   Status post total right knee replacement   S/P total knee replacement   Discharge Diagnoses:  Same  Past Medical History:  Diagnosis Date   Anemia    Arthritis    "left knee" (01/01/2013)   Diabetes mellitus without complication (HCC)    borderline no med   DVT of lower extremity (deep venous thrombosis) (HCC) 12/27/2005   "LLE; from birth control pill" (01/01/2013)   GERD (gastroesophageal reflux disease)    Headache    pt. states migraines at times   Hypercholesteremia    Crestor   Hypertension     Surgeries: Procedure(s): RIGHT TOTAL KNEE ARTHROPLASTY on 09/02/2023   Consultants:   Discharged Condition: Improved  Hospital Course: Diana Bowers is an 72 y.o. female who was admitted 09/02/2023 for operative treatment ofUnilateral primary osteoarthritis, right knee. Patient has severe unremitting pain that affects sleep, daily activities, and work/hobbies. After pre-op clearance the patient was taken to the operating room on 09/02/2023 and underwent  Procedure(s): RIGHT TOTAL KNEE ARTHROPLASTY.    Patient was given perioperative antibiotics:  Anti-infectives (From admission, onward)    Start     Dose/Rate Route Frequency Ordered Stop   09/02/23 1800  ceFAZolin (ANCEF) IVPB 1 g/50 mL premix        1 g 100 mL/hr over 30 Minutes Intravenous Every 6 hours 09/02/23 1611 09/03/23 1425   09/02/23 0845  ceFAZolin (ANCEF) IVPB 2g/100 mL premix        2 g 200 mL/hr over 30 Minutes Intravenous On call to O.R. 09/02/23 7829 09/02/23 1144        Patient was given sequential compression devices, early ambulation, and chemoprophylaxis to prevent DVT.  Patient benefited maximally from hospital stay and there were no complications.    Recent vital signs: Patient  Vitals for the past 24 hrs:  BP Temp Temp src Pulse Resp SpO2  09/06/23 0451 (!) 143/96 98.6 F (37 C) -- 72 17 98 %  09/06/23 0036 (!) 157/81 -- -- -- -- --  09/05/23 2235 (!) 164/88 98.8 F (37.1 C) Oral 90 18 100 %  09/05/23 2121 (!) 165/73 -- -- -- -- --  09/05/23 1455 (!) 125/56 98.4 F (36.9 C) -- 72 18 96 %  09/05/23 0708 133/61 -- -- 87 18 99 %     Recent laboratory studies: No results for input(s): "WBC", "HGB", "HCT", "PLT", "NA", "K", "CL", "CO2", "BUN", "CREATININE", "GLUCOSE", "INR", "CALCIUM" in the last 72 hours.  Invalid input(s): "PT", "2"   Discharge Medications:   Allergies as of 09/06/2023       Reactions   Penicillins Hives   Has patient had a PCN reaction causing immediate rash, facial/tongue/throat swelling, SOB or lightheadedness with hypotension:Yes Has patient had a PCN reaction causing severe rash involving mucus membranes or skin necrosis: No Has patient had a PCN reaction that required hospitalization: No Has patient had a PCN reaction occurring within the last 10 years: Unknown If all of the above answers are "NO", then may proceed with Cephalosporin use.        Medication List     STOP taking these medications    traMADol 50 MG tablet Commonly known as: ULTRAM       TAKE these medications    aspirin 81 MG chewable tablet  Chew 1 tablet (81 mg total) by mouth 2 (two) times daily.   cholecalciferol 25 MCG (1000 UNIT) tablet Commonly known as: VITAMIN D3 Take 1,000 Units by mouth daily.   Crestor 10 MG tablet Generic drug: rosuvastatin Take 10 mg by mouth at bedtime.   diltiazem 180 MG 24 hr capsule Commonly known as: CARDIZEM CD Take 180 mg by mouth in the morning and at bedtime.   loteprednol 0.5 % ophthalmic suspension Commonly known as: LOTEMAX Place 1 drop into both eyes 2 (two) times daily.   methocarbamol 500 MG tablet Commonly known as: ROBAXIN Take 1 tablet (500 mg total) by mouth every 6 (six) hours as needed for  muscle spasms.   oxyCODONE 5 MG immediate release tablet Commonly known as: Oxy IR/ROXICODONE Take 1-2 tablets (5-10 mg total) by mouth every 4 (four) hours as needed for moderate pain (pain score 4-6).   pantoprazole 40 MG tablet Commonly known as: PROTONIX Take 40 mg by mouth at bedtime.               Durable Medical Equipment  (From admission, onward)           Start     Ordered   09/02/23 1612  DME 3 n 1  Once        09/02/23 1611   09/02/23 1612  DME Walker rolling  Once       Question Answer Comment  Walker: With 5 Inch Wheels   Patient needs a walker to treat with the following condition Status post total right knee replacement      09/02/23 1611            Diagnostic Studies: DG Knee Right Port  Result Date: 09/02/2023 CLINICAL DATA:  Right knee arthroplasty. EXAM: PORTABLE RIGHT KNEE - 1-2 VIEW COMPARISON:  None Available. FINDINGS: Right knee arthroplasty in expected alignment. No periprosthetic lucency or fracture. There has been patellar resurfacing. Recent postsurgical change includes air and edema in the soft tissues and joint space. Anterior skin staples in place. IMPRESSION: Right knee arthroplasty without immediate postoperative complication. Electronically Signed   By: Narda Rutherford M.D.   On: 09/02/2023 15:56    Disposition: Discharge disposition: 03-Skilled Nursing Facility          Contact information for follow-up providers     Kathryne Hitch, MD Follow up in 2 week(s).   Specialty: Orthopedic Surgery Contact information: [address] [place] [address] [phone]              Contact information for after-discharge care     Destination     [facility], [facility] .   Service: Skilled Nursing Contact information: [address] [place] [address] [phone]                      Signed: Kathryne Hitch 09/06/2023, 6:57  AM

## 2023-09-15 ENCOUNTER — Other Ambulatory Visit: Payer: Self-pay

## 2023-09-15 ENCOUNTER — Ambulatory Visit (INDEPENDENT_AMBULATORY_CARE_PROVIDER_SITE_OTHER): Payer: Medicare Other | Admitting: Orthopaedic Surgery

## 2023-09-15 ENCOUNTER — Encounter: Payer: Self-pay | Admitting: Orthopaedic Surgery

## 2023-09-15 DIAGNOSIS — Z96651 Presence of right artificial knee joint: Secondary | ICD-10-CM

## 2023-09-15 NOTE — Progress Notes (Signed)
The patient comes in today 2 weeks status post a right total knee arthroplasty.  She has remote history of having a left knee replacement.  She says that the home therapist has been able to flex her to almost 80 degrees.  She has been compliant with a baby aspirin twice daily and wearing compressive hose.  Her right knee itself is big and swollen.  The staples were then removed and Steri-Strips applied.  Incision itself looks good.  Her calf is soft.  There is swelling in her foot and ankle as well.  We will transition nursing to outpatient physical therapy.  I would like to see her back in 4 weeks for repeat exam but no x-rays are needed.  She will continue to wear compressive garments but can stop the aspirin.  All questions and concerns were answered and addressed.  She is only taking tramadol for pain because she cannot tolerate stronger narcotics.  When she runs low she knows to let us know.

## 2023-09-25 ENCOUNTER — Other Ambulatory Visit: Payer: Self-pay

## 2023-09-25 ENCOUNTER — Encounter (HOSPITAL_BASED_OUTPATIENT_CLINIC_OR_DEPARTMENT_OTHER): Payer: Self-pay | Admitting: Emergency Medicine

## 2023-09-25 ENCOUNTER — Inpatient Hospital Stay (HOSPITAL_BASED_OUTPATIENT_CLINIC_OR_DEPARTMENT_OTHER)
Admission: EM | Admit: 2023-09-25 | Discharge: 2023-09-27 | DRG: 916 | Disposition: A | Discharge status: Home / Self Care | Payer: Medicare Other | Attending: Critical Care Medicine | Admitting: Critical Care Medicine

## 2023-09-25 DIAGNOSIS — E7800 Pure hypercholesterolemia, unspecified: Secondary | ICD-10-CM | POA: Diagnosis present

## 2023-09-25 DIAGNOSIS — G4733 Obstructive sleep apnea (adult) (pediatric): Secondary | ICD-10-CM | POA: Diagnosis present

## 2023-09-25 DIAGNOSIS — Z9989 Dependence on other enabling machines and devices: Secondary | ICD-10-CM

## 2023-09-25 DIAGNOSIS — R1310 Dysphagia, unspecified: Secondary | ICD-10-CM | POA: Diagnosis present

## 2023-09-25 DIAGNOSIS — Z885 Allergy status to narcotic agent status: Secondary | ICD-10-CM | POA: Diagnosis not present

## 2023-09-25 DIAGNOSIS — Z7982 Long term (current) use of aspirin: Secondary | ICD-10-CM

## 2023-09-25 DIAGNOSIS — N179 Acute kidney failure, unspecified: Secondary | ICD-10-CM | POA: Diagnosis present

## 2023-09-25 DIAGNOSIS — T380X5A Adverse effect of glucocorticoids and synthetic analogues, initial encounter: Secondary | ICD-10-CM | POA: Diagnosis present

## 2023-09-25 DIAGNOSIS — T783XXA Angioneurotic edema, initial encounter: Principal | ICD-10-CM

## 2023-09-25 DIAGNOSIS — Z79899 Other long term (current) drug therapy: Secondary | ICD-10-CM | POA: Diagnosis not present

## 2023-09-25 DIAGNOSIS — Z96651 Presence of right artificial knee joint: Secondary | ICD-10-CM | POA: Diagnosis present

## 2023-09-25 DIAGNOSIS — Z880 Allergy status to penicillin: Secondary | ICD-10-CM | POA: Diagnosis not present

## 2023-09-25 DIAGNOSIS — Z96641 Presence of right artificial hip joint: Secondary | ICD-10-CM | POA: Diagnosis present

## 2023-09-25 DIAGNOSIS — M25561 Pain in right knee: Secondary | ICD-10-CM

## 2023-09-25 DIAGNOSIS — D62 Acute posthemorrhagic anemia: Secondary | ICD-10-CM | POA: Diagnosis present

## 2023-09-25 DIAGNOSIS — Z9889 Other specified postprocedural states: Secondary | ICD-10-CM | POA: Diagnosis not present

## 2023-09-25 DIAGNOSIS — I10 Essential (primary) hypertension: Secondary | ICD-10-CM | POA: Diagnosis present

## 2023-09-25 DIAGNOSIS — Z86718 Personal history of other venous thrombosis and embolism: Secondary | ICD-10-CM | POA: Diagnosis not present

## 2023-09-25 DIAGNOSIS — T781XXA Other adverse food reactions, not elsewhere classified, initial encounter: Secondary | ICD-10-CM | POA: Diagnosis present

## 2023-09-25 DIAGNOSIS — R471 Dysarthria and anarthria: Secondary | ICD-10-CM | POA: Diagnosis present

## 2023-09-25 DIAGNOSIS — T783XXD Angioneurotic edema, subsequent encounter: Secondary | ICD-10-CM | POA: Diagnosis not present

## 2023-09-25 DIAGNOSIS — K219 Gastro-esophageal reflux disease without esophagitis: Secondary | ICD-10-CM | POA: Diagnosis present

## 2023-09-25 DIAGNOSIS — E1165 Type 2 diabetes mellitus with hyperglycemia: Secondary | ICD-10-CM | POA: Diagnosis present

## 2023-09-25 LAB — CBC
HCT: 33.8 % — ABNORMAL LOW (ref 36.0–46.0)
Hemoglobin: 10.5 g/dL — ABNORMAL LOW (ref 12.0–15.0)
MCH: 25.3 pg — ABNORMAL LOW (ref 26.0–34.0)
MCHC: 31.1 g/dL (ref 30.0–36.0)
MCV: 81.4 fL (ref 80.0–100.0)
Platelets: 273 10*3/uL (ref 150–400)
RBC: 4.15 MIL/uL (ref 3.87–5.11)
RDW: 14.8 % (ref 11.5–15.5)
WBC: 8.3 10*3/uL (ref 4.0–10.5)
nRBC: 0 % (ref 0.0–0.2)

## 2023-09-25 LAB — BASIC METABOLIC PANEL
Anion gap: 12 (ref 5–15)
BUN: 25 mg/dL — ABNORMAL HIGH (ref 8–23)
CO2: 23 mmol/L (ref 22–32)
Calcium: 9.4 mg/dL (ref 8.9–10.3)
Chloride: 104 mmol/L (ref 98–111)
Creatinine, Ser: 1.25 mg/dL — ABNORMAL HIGH (ref 0.44–1.00)
GFR, Estimated: 46 mL/min — ABNORMAL LOW (ref >60)
Glucose, Bld: 113 mg/dL — ABNORMAL HIGH (ref 70–99)
Potassium: 4 mmol/L (ref 3.5–5.1)
Sodium: 139 mmol/L (ref 135–145)

## 2023-09-25 LAB — GLUCOSE, CAPILLARY: Glucose-Capillary: 138 mg/dL — ABNORMAL HIGH (ref 70–99)

## 2023-09-25 MED ORDER — INSULIN ASPART 100 UNIT/ML IJ SOLN
0.0000 [IU] | INTRAMUSCULAR | Status: DC
  Administered 2023-09-26: 2 [IU] via SUBCUTANEOUS
  Administered 2023-09-26 (×3): 1 [IU] via SUBCUTANEOUS
  Administered 2023-09-26: 2 [IU] via SUBCUTANEOUS

## 2023-09-25 MED ORDER — ORAL CARE MOUTH RINSE: 15.0000 mL | OROMUCOSAL | Status: DC | PRN

## 2023-09-25 MED ORDER — CHLORHEXIDINE GLUCONATE CLOTH 2 % EX PADS
6.0000 | MEDICATED_PAD | Freq: Every day | CUTANEOUS | Status: DC
  Administered 2023-09-25 – 2023-09-27 (×3): 6 via TOPICAL

## 2023-09-25 MED ORDER — DEXAMETHASONE SODIUM PHOSPHATE 4 MG/ML IJ SOLN
4.0000 mg | Freq: Four times a day (QID) | INTRAMUSCULAR | Status: DC
  Administered 2023-09-26 (×3): 4 mg via INTRAVENOUS
  Filled 2023-09-25 (×6): qty 1

## 2023-09-25 MED ORDER — ROSUVASTATIN CALCIUM 5 MG PO TABS
10.0000 mg | ORAL_TABLET | Freq: Every day | ORAL | Status: DC
  Administered 2023-09-26: 10 mg via ORAL
  Filled 2023-09-25: qty 2

## 2023-09-25 MED ORDER — ENOXAPARIN SODIUM 40 MG/0.4ML IJ SOSY
40.0000 mg | PREFILLED_SYRINGE | INTRAMUSCULAR | Status: DC
  Administered 2023-09-26 – 2023-09-27 (×2): 40 mg via SUBCUTANEOUS
  Filled 2023-09-25 (×2): qty 0.4

## 2023-09-25 MED ORDER — ASPIRIN 81 MG PO CHEW: 81.0000 mg | CHEWABLE_TABLET | Freq: Two times a day (BID) | ORAL | Status: DC

## 2023-09-25 MED ORDER — PANTOPRAZOLE SODIUM 40 MG PO TBEC
40.0000 mg | DELAYED_RELEASE_TABLET | Freq: Every day | ORAL | Status: DC
  Administered 2023-09-26: 40 mg via ORAL
  Filled 2023-09-25: qty 1

## 2023-09-25 MED ORDER — EPINEPHRINE 0.3 MG/0.3ML IJ SOAJ
0.3000 mg | Freq: Once | INTRAMUSCULAR | Status: AC
  Administered 2023-09-25: 0.3 mg via INTRAMUSCULAR
  Filled 2023-09-25: qty 0.3

## 2023-09-25 MED ORDER — LACTATED RINGERS IV SOLN: INTRAVENOUS | Status: DC

## 2023-09-25 MED ORDER — FAMOTIDINE IN NACL 20-0.9 MG/50ML-% IV SOLN
20.0000 mg | Freq: Two times a day (BID) | INTRAVENOUS | Status: DC
  Administered 2023-09-26 – 2023-09-27 (×4): 20 mg via INTRAVENOUS
  Filled 2023-09-25 (×5): qty 50

## 2023-09-25 MED ORDER — DEXAMETHASONE SODIUM PHOSPHATE 10 MG/ML IJ SOLN
10.0000 mg | Freq: Once | INTRAMUSCULAR | Status: AC
  Administered 2023-09-25: 10 mg via INTRAVENOUS
  Filled 2023-09-25: qty 1

## 2023-09-25 MED ORDER — DIPHENHYDRAMINE HCL 50 MG/ML IJ SOLN
25.0000 mg | Freq: Once | INTRAMUSCULAR | Status: AC
  Administered 2023-09-25: 25 mg via INTRAVENOUS
  Filled 2023-09-25: qty 1

## 2023-09-25 MED ORDER — FAMOTIDINE IN NACL 20-0.9 MG/50ML-% IV SOLN
20.0000 mg | Freq: Once | INTRAVENOUS | Status: AC
  Administered 2023-09-25: 20 mg via INTRAVENOUS
  Filled 2023-09-25: qty 50

## 2023-09-25 MED ORDER — DIPHENHYDRAMINE HCL 50 MG/ML IJ SOLN
25.0000 mg | Freq: Four times a day (QID) | INTRAMUSCULAR | Status: DC
  Administered 2023-09-26 (×3): 25 mg via INTRAVENOUS
  Filled 2023-09-25 (×3): qty 1

## 2023-09-25 NOTE — H&P (Incomplete)
NAME:  Diana Bowers, MRN:  [id number], DOB:  1951-05-25, LOS: 0 ADMISSION DATE:  09/25/2023, CONSULTATION DATE:  *** REFERRING MD:  ***, CHIEF COMPLAINT:  ***   History of Present Illness:  ***  Pertinent  Medical History  ***  Significant Hospital Events: Including procedures, antibiotic start and stop dates in addition to other pertinent events     Interim History / Subjective:  ***  Objective   Blood pressure (!) 174/75, pulse 76, temperature 99.5 F (37.5 C), temperature source Oral, resp. rate 13, height 5\' 3"  (1.6 m), weight 104.5 kg, SpO2 95%.        Intake/Output Summary (Last 24 hours) at 09/25/2023 2346 Last data filed at 09/25/2023 2201 Gross per 24 hour  Intake 45.91 ml  Output --  Net 45.91 ml   Filed Weights   09/25/23 2122 09/25/23 2300  Weight: 105 kg 104.5 kg    Examination: General: lying in bed in NAD HENT: AT/[place], tongue swollen, normal pink color Lungs: *** Cardiovascular: *** Abdomen: *** Extremities: *** Neuro: *** GU: ***  Resolved Hospital Problem list   N/a  Assessment & Plan:  Angioedema: Sudden onset tongue swelling.  Not on ACE inhibitor.  No obvious medication culprits.  New food/condiment honey mustard on the evening prior to symptoms.  Suspect allergic reaction to this.  Improvement with antihistamines, epinephrine, dexamethasone. -- Dexamethasone 4 mg IV every 6 hours, Pepcid 20 mg IV every 12 hours, Benadryl 25 mg IV every 6 hours  AKI: -- LR 100 cc/h x 10 hours  Anemia: Noted postoperatively with mild worsening on repeat labs. -- Recheck, transfuse for hemoglobin less than 7  Hyperglycemia: Mild.  Steroid administration wise to continue checking. -- Sensitive SSI, every 4 hours glucose checks while n.p.o.  Hypertension: -- Hold diltiazem, labetalol as needed  GERD: -- Resume home PPI  Hyperlipidemia: -- Resume home statin  Best Practice (right click and "Reselect all SmartList Selections" daily)   Diet/type:  NPO DVT prophylaxis: LMWH GI prophylaxis: N/A Lines: N/A Foley:  N/A Code Status:  full code Last date of multidisciplinary goals of care discussion [patient updated at bedside on admission]  Labs   CBC: Recent Labs  Lab 09/25/23 2141  WBC 8.3  HGB 10.5*  HCT 33.8*  MCV 81.4  PLT 273    Basic Metabolic Panel: Recent Labs  Lab 09/25/23 2141  NA 139  K 4.0  CL 104  CO2 23  GLUCOSE 113*  BUN 25*  CREATININE 1.25*  CALCIUM 9.4   GFR: Estimated Creatinine Clearance: 48.4 mL/min (A) (by C-G formula based on SCr of 1.25 mg/dL (H)). Recent Labs  Lab 09/25/23 2141  WBC 8.3    Liver Function Tests: No results for input(s): "AST", "ALT", "ALKPHOS", "BILITOT", "PROT", "ALBUMIN" in the last 168 hours. No results for input(s): "LIPASE", "AMYLASE" in the last 168 hours. No results for input(s): "AMMONIA" in the last 168 hours.  ABG No results found for: "PHART", "PCO2ART", "PO2ART", "HCO3", "TCO2", "ACIDBASEDEF", "O2SAT"   Coagulation Profile: No results for input(s): "INR", "PROTIME" in the last 168 hours.  Cardiac Enzymes: No results for input(s): "CKTOTAL", "CKMB", "CKMBINDEX", "TROPONINI" in the last 168 hours.  HbA1C: Hgb A1c MFr Bld  Date/Time Value Ref Range Status  08/23/2023 10:26 AM 6.4 (H) 4.8 - 5.6 % Final    Comment:    (NOTE) Pre diabetes:          5.7%-6.4%  Diabetes:              >  6.4%  Glycemic control for   <7.0% adults with diabetes   09/08/2018 02:27 PM 6.0 (H) 4.8 - 5.6 % Final    Comment:    (NOTE) Pre diabetes:          5.7%-6.4% Diabetes:              >6.4% Glycemic control for   <7.0% adults with diabetes     CBG: Recent Labs  Lab 09/25/23 2332  GLUCAP 138*    Review of Systems:   No chest pain with exertion.  No orthopnea or PND.  Comprehensive review of systems otherwise negative.  Past Medical History:  She,  has a past medical history of Anemia, Arthritis, Diabetes mellitus without complication (HCC), DVT of lower  extremity (deep venous thrombosis) (HCC) (12/27/2005), GERD (gastroesophageal reflux disease), Headache, Hypercholesteremia, and Hypertension.   Surgical History:   Past Surgical History:  Procedure Laterality Date  . ANTERIOR CERVICAL DECOMP/DISCECTOMY FUSION  2006  . CARPAL TUNNEL RELEASE  2000   "right" (01/01/2013)  . CESAREAN SECTION  1978  . COLONOSCOPY    . ESOPHAGOGASTRODUODENOSCOPY    . I & D KNEE WITH POLY EXCHANGE Left 09/15/2018   Procedure: POLY EXCHANGE LEFT KNEE,/,PATELLAR COMPONENT REVISION  STEROID INJECTION LEFT HIP;  Surgeon: Kathryne Hitch, MD;  Location: [facility]S;  Service: Orthopedics;  Laterality: Left;  . KIDNEY DONATION  1994  . KNEE ARTHROPLASTY  01/01/2013   Procedure: COMPUTER ASSISTED TOTAL KNEE ARTHROPLASTY;  Surgeon: Kerrin Champagne, MD;  Location: [facility] OR;  Service: Orthopedics;  Laterality: Left;  Left computer assisted total knee replacement  . LIPOMA EXCISION  2000   back  . REPLACEMENT TOTAL KNEE  01/01/2013   "left" (01/01/2013)  . TOTAL HIP ARTHROPLASTY Right 07/22/2015   Procedure: RIGHT TOTAL HIP ARTHROPLASTY ANTERIOR APPROACH;  Surgeon: Kathryne Hitch, MD;  Location: [facility] OR;  Service: Orthopedics;  Laterality: Right;  . TOTAL KNEE ARTHROPLASTY Right 09/02/2023   Procedure: RIGHT TOTAL KNEE ARTHROPLASTY;  Surgeon: Kathryne Hitch, MD;  Location: [facility] ORS;  Service: Orthopedics;  Laterality: Right;     Social History:   reports that she has never smoked. She has never used smokeless tobacco. She reports that she does not drink alcohol and does not use drugs.   Family History:  Her family history is negative for Breast cancer.   Allergies Allergies  Allergen Reactions  . Penicillins Hives    Has patient had a PCN reaction causing immediate rash, facial/tongue/throat swelling, SOB or lightheadedness with hypotension:Yes Has patient had a PCN reaction causing severe rash involving mucus membranes or skin necrosis: No Has patient had a PCN  reaction that required hospitalization: No Has patient had a PCN reaction occurring within the last 10 years: Unknown If all of the above answers are "NO", then may proceed with Cephalosporin use.      Home Medications  Prior to Admission medications   Medication Sig Start Date End Date Taking? Authorizing Provider  aspirin 81 MG chewable tablet Chew 1 tablet (81 mg total) by mouth 2 (two) times daily. 09/05/23   Kathryne Hitch, MD  cholecalciferol (VITAMIN D3) 25 MCG (1000 UNIT) tablet Take 1,000 Units by mouth daily.    [provider]  CRESTOR 10 MG tablet Take 10 mg by mouth at bedtime. 07/04/15   [provider]  diltiazem (CARDIZEM CD) 180 MG 24 hr capsule Take 180 mg by mouth in the morning and at bedtime.    [provider]  loteprednol (LOTEMAX) 0.5 % ophthalmic suspension Place 1 drop into both eyes 2 (two) times daily.    [provider]  methocarbamol (ROBAXIN) 500 MG tablet Take 1 tablet (500 mg total) by mouth every 6 (six) hours as needed for muscle spasms. 09/05/23   Kathryne Hitch, MD  oxyCODONE (OXY IR/ROXICODONE) 5 MG immediate release tablet Take 1-2 tablets (5-10 mg total) by mouth every 4 (four) hours as needed for moderate pain (pain score 4-6). 09/05/23   Kathryne Hitch, MD  pantoprazole (PROTONIX) 40 MG tablet Take 40 mg by mouth at bedtime. 08/30/18   [provider]     Critical care time:     CRITICAL CARE Performed by: Karren Burly   Total critical care time: 31 minutes  Critical care time was exclusive of separately billable procedures and treating other patients.  Critical care was necessary to treat or prevent imminent or life-threatening deterioration.  Critical care was time spent personally by me on the following activities: development of treatment plan with patient and/or surrogate as well as nursing, discussions with consultants, evaluation of patient's response to treatment,  examination of patient, obtaining history from patient or surrogate, ordering and performing treatments and interventions, ordering and review of laboratory studies, ordering and review of radiographic studies, pulse oximetry and re-evaluation of patient's condition.  Karren Burly, MD See Loretha Stapler

## 2023-09-25 NOTE — Progress Notes (Signed)
eLink Physician-Brief Progress Note Patient Name: Diana Bowers DOB: November 15, 1951 MRN: [id number]   Date of Service  09/25/2023  HPI/Events of Note  72/F being admitted for angioedema. Given epinephrine, benadryl, pepcid, decadron.  Seen in ICU, BP 164/75, HR 75, RR 20, SpO2 95% on room air.  Pt lying in bed, appears comfortable.  Mouth closed, able to control secretions.    eICU Interventions  Will continue to serially monitor progression of angioedema.  Maintain low threshold to intubate.          [name] M DELA CRUZ 09/25/2023, 11:17 PM  3:42 AM Pt complaining of knee pain. Presently NPO.  Placed order for morphine IV x1.  Will monitor response.

## 2023-09-25 NOTE — Progress Notes (Signed)
   PCCM transfer request    Sending physician: Dr. Lynelle Doctor  Sending facility: [facility]  Reason for transfer: angioedema  Brief case summary: 72 year old recent orthopedic procedure, knee replacement presents to ED with sudden onset of tongue swelling concerning for angioedema.  Per ED physician, patient denies any new medication.  Not on ACE inhibitor.  No prior history of the same.  Did eat and do food or condiment, some kind of mustard this evening.  Was given Benadryl, Pepcid, 10 mg Decadron.  Has some dysarthria but otherwise breathing fine.  Recommendations made prior to transfer: Contact ENT for consultation  Transfer accepted: yes   Lesia Sago [facility] 09/25/23 9:44 PM [facility]  For contact information, see Amion. If no response to pager, please call PCCM consult pager. After hours, 7PM- 7AM, please call Elink.

## 2023-09-25 NOTE — H&P (Signed)
NAME:  Diana Bowers, MRN:  [id number], DOB:  1951-05-27, LOS: 0 ADMISSION DATE:  09/25/2023, CONSULTATION DATE:  09/26/23 REFERRING MD:  ED, CHIEF COMPLAINT:  tongue swelling   History of Present Illness:  72 year old presented to ED with sudden onset tongue swelling presumed allergic reaction to honey mustard.  In usual state of health.  Tongue swelling in the evening 9/29.  Presented to the ED very quickly.  Now on ACE inhibitor.  No new medications.  Recent knee replacement on aspirin for reported DVT prophylaxis. was started weeks ago.  Had new food or condiment this evening.  Honey mustard.  Seen in the ED.  Given dexamethasone, epinephrine, Pepcid, Benadryl.  Transferred to ICU at [facility] for med [facility] for close monitoring.  At time of admission, patient reports mild improvement in swelling as well as improvement in dysarthria.  Labs L for AKI and mild worsening anemia postoperatively.  Pertinent  Medical History  As per EMR  Significant Hospital Events: Including procedures, antibiotic start and stop dates in addition to other pertinent events   09/25/2023 admitted to the hospital with presumed angioedema from allergic reaction, tongue swelling after honey mustard ingestion (new brand or complement)  Interim History / Subjective:  N/a  Objective   Blood pressure (!) 174/75, pulse 76, temperature 99.5 F (37.5 C), temperature source Oral, resp. rate 13, height 5\' 3"  (1.6 m), weight 104.5 kg, SpO2 95%.        Intake/Output Summary (Last 24 hours) at 09/25/2023 2346 Last data filed at 09/25/2023 2201 Gross per 24 hour  Intake 45.91 ml  Output --  Net 45.91 ml   Filed Weights   09/25/23 2122 09/25/23 2300  Weight: 105 kg 104.5 kg    Examination: General: lying in bed in NAD HENT: AT/[place], tongue swollen, normal pink color Lungs: Normal work of breathing on room air Cardiovascular: Regular rate and rhythm, no murmur appreciated Abdomen: Nondistended, bowel  sounds present Neuro: No focal deficits, dysarthric due to tongue swelling   Resolved Hospital Problem list   N/a  Assessment & Plan:  Angioedema: Sudden onset tongue swelling.  Not on ACE inhibitor.  No obvious medication culprits.  New food/condiment honey mustard on the evening prior to symptoms.  Suspect allergic reaction to this.  NSAIDs can rarely cause angioedema, aspirin considered.  Improvement with antihistamines, epinephrine, dexamethasone. -- Dexamethasone 4 mg IV every 6 hours, Pepcid 20 mg IV every 12 hours, Benadryl 25 mg IV every 6 hours -- Do not administer aspirin at this time  AKI: -- LR 100 cc/h x 10 hours  Anemia: Noted postoperatively with mild worsening on repeat labs. -- Recheck, transfuse for hemoglobin less than 7  Hyperglycemia: Mild.  Steroid administration wise to continue checking. -- Sensitive SSI, every 4 hours glucose checks while n.p.o.  Hypertension: -- Hold diltiazem, labetalol as needed  GERD: -- Resume home PPI  Hyperlipidemia: -- Resume home statin  Best Practice (right click and "Reselect all SmartList Selections" daily)   Diet/type: NPO DVT prophylaxis: LMWH GI prophylaxis: N/A Lines: N/A Foley:  N/A Code Status:  full code Last date of multidisciplinary goals of care discussion [patient updated at bedside on admission]  Labs   CBC: Recent Labs  Lab 09/25/23 2141  WBC 8.3  HGB 10.5*  HCT 33.8*  MCV 81.4  PLT 273    Basic Metabolic Panel: Recent Labs  Lab 09/25/23 2141  NA 139  K 4.0  CL 104  CO2 23  GLUCOSE 113*  BUN 25*  CREATININE 1.25*  CALCIUM 9.4   GFR: Estimated Creatinine Clearance: 48.4 mL/min (A) (by C-G formula based on SCr of 1.25 mg/dL (H)). Recent Labs  Lab 09/25/23 2141  WBC 8.3    Liver Function Tests: No results for input(s): "AST", "ALT", "ALKPHOS", "BILITOT", "PROT", "ALBUMIN" in the last 168 hours. No results for input(s): "LIPASE", "AMYLASE" in the last 168 hours. No results for  input(s): "AMMONIA" in the last 168 hours.  ABG No results found for: "PHART", "PCO2ART", "PO2ART", "HCO3", "TCO2", "ACIDBASEDEF", "O2SAT"   Coagulation Profile: No results for input(s): "INR", "PROTIME" in the last 168 hours.  Cardiac Enzymes: No results for input(s): "CKTOTAL", "CKMB", "CKMBINDEX", "TROPONINI" in the last 168 hours.  HbA1C: Hgb A1c MFr Bld  Date/Time Value Ref Range Status  08/23/2023 10:26 AM 6.4 (H) 4.8 - 5.6 % Final    Comment:    (NOTE) Pre diabetes:          5.7%-6.4%  Diabetes:              >6.4%  Glycemic control for   <7.0% adults with diabetes   09/08/2018 02:27 PM 6.0 (H) 4.8 - 5.6 % Final    Comment:    (NOTE) Pre diabetes:          5.7%-6.4% Diabetes:              >6.4% Glycemic control for   <7.0% adults with diabetes     CBG: Recent Labs  Lab 09/25/23 2332  GLUCAP 138*    Review of Systems:   No chest pain with exertion.  No orthopnea or PND.  Comprehensive review of systems otherwise negative.  Past Medical History:  She,  has a past medical history of Anemia, Arthritis, Diabetes mellitus without complication (HCC), DVT of lower extremity (deep venous thrombosis) (HCC) (12/27/2005), GERD (gastroesophageal reflux disease), Headache, Hypercholesteremia, and Hypertension.   Surgical History:   Past Surgical History:  Procedure Laterality Date   ANTERIOR CERVICAL DECOMP/DISCECTOMY FUSION  2006   CARPAL TUNNEL RELEASE  2000   "right" (01/01/2013)   CESAREAN SECTION  1978   COLONOSCOPY     ESOPHAGOGASTRODUODENOSCOPY     I & D KNEE WITH POLY EXCHANGE Left 09/15/2018   Procedure: POLY EXCHANGE LEFT KNEE,/,PATELLAR COMPONENT REVISION  STEROID INJECTION LEFT HIP;  Surgeon: Kathryne Hitch, MD;  Location: [facility];  Service: Orthopedics;  Laterality: Left;   KIDNEY DONATION  1994   KNEE ARTHROPLASTY  01/01/2013   Procedure: COMPUTER ASSISTED TOTAL KNEE ARTHROPLASTY;  Surgeon: Kerrin Champagne, MD;  Location: [facility] OR;  Service:  Orthopedics;  Laterality: Left;  Left computer assisted total knee replacement   LIPOMA EXCISION  2000   back   REPLACEMENT TOTAL KNEE  01/01/2013   "left" (01/01/2013)   TOTAL HIP ARTHROPLASTY Right 07/22/2015   Procedure: RIGHT TOTAL HIP ARTHROPLASTY ANTERIOR APPROACH;  Surgeon: Kathryne Hitch, MD;  Location: [facility] OR;  Service: Orthopedics;  Laterality: Right;   TOTAL KNEE ARTHROPLASTY Right 09/02/2023   Procedure: RIGHT TOTAL KNEE ARTHROPLASTY;  Surgeon: Kathryne Hitch, MD;  Location: [facility];  Service: Orthopedics;  Laterality: Right;     Social History:   reports that she has never smoked. She has never used smokeless tobacco. She reports that she does not drink alcohol and does not use drugs.   Family History:  Her family history is negative for Breast cancer.   Allergies Allergies  Allergen Reactions   Penicillins Hives  Has patient had a PCN reaction causing immediate rash, facial/tongue/throat swelling, SOB or lightheadedness with hypotension:Yes Has patient had a PCN reaction causing severe rash involving mucus membranes or skin necrosis: No Has patient had a PCN reaction that required hospitalization: No Has patient had a PCN reaction occurring within the last 10 years: Unknown If all of the above answers are "NO", then may proceed with Cephalosporin use.      Home Medications  Prior to Admission medications   Medication Sig Start Date End Date Taking? Authorizing Provider  aspirin 81 MG chewable tablet Chew 1 tablet (81 mg total) by mouth 2 (two) times daily. 09/05/23   Kathryne Hitch, MD  cholecalciferol (VITAMIN D3) 25 MCG (1000 UNIT) tablet Take 1,000 Units by mouth daily.    [provider]  CRESTOR 10 MG tablet Take 10 mg by mouth at bedtime. 07/04/15   [provider]  diltiazem (CARDIZEM CD) 180 MG 24 hr capsule Take 180 mg by mouth in the morning and at bedtime.    [provider]  loteprednol (LOTEMAX) 0.5 % ophthalmic  suspension Place 1 drop into both eyes 2 (two) times daily.    [provider]  methocarbamol (ROBAXIN) 500 MG tablet Take 1 tablet (500 mg total) by mouth every 6 (six) hours as needed for muscle spasms. 09/05/23   Kathryne Hitch, MD  oxyCODONE (OXY IR/ROXICODONE) 5 MG immediate release tablet Take 1-2 tablets (5-10 mg total) by mouth every 4 (four) hours as needed for moderate pain (pain score 4-6). 09/05/23   Kathryne Hitch, MD  pantoprazole (PROTONIX) 40 MG tablet Take 40 mg by mouth at bedtime. 08/30/18   [provider]     Critical care time:     CRITICAL CARE Performed by: Karren Burly   Total critical care time: 31 minutes  Critical care time was exclusive of separately billable procedures and treating other patients.  Critical care was necessary to treat or prevent imminent or life-threatening deterioration.  Critical care was time spent personally by me on the following activities: development of treatment plan with patient and/or surrogate as well as nursing, discussions with consultants, evaluation of patient's response to treatment, examination of patient, obtaining history from patient or surrogate, ordering and performing treatments and interventions, ordering and review of laboratory studies, ordering and review of radiographic studies, pulse oximetry and re-evaluation of patient's condition.  Karren Burly, MD See Loretha Stapler

## 2023-09-25 NOTE — ED Provider Notes (Signed)
[place] EM[facility]GENCY DEPARTMENT AT [facility] Provider Note   CSN: [id number] Arrival date & time: 09/25/23  2113     History  Chief Complaint  Patient presents with   Allergic Reaction    Diana Bowers is a 72 y.o. female.   Allergic Reaction    Patient presents to the ED for evaluation of acute tongue swelling.  Patient has history of hypertension acid reflux hypercholesterolemia diabetes.  Patient was admitted to the hospital earlier this month for right total knee arthroplasty.  Patient states she has been recovering well from that.  All send this evening she started noticing swelling of her tongue.  Patient states is making it hard for her to swallow.  She denies any rashes.  No chest pain.  No swelling elsewhere.  No new medications.  Home Medications Prior to Admission medications   Medication Sig Start Date End Date Taking? Authorizing Provider  aspirin 81 MG chewable tablet Chew 1 tablet (81 mg total) by mouth 2 (two) times daily. 09/05/23   Kathryne Hitch, MD  cholecalciferol (VITAMIN D3) 25 MCG (1000 UNIT) tablet Take 1,000 Units by mouth daily.    [provider]  CRESTOR 10 MG tablet Take 10 mg by mouth at bedtime. 07/04/15   [provider]  diltiazem (CARDIZEM CD) 180 MG 24 hr capsule Take 180 mg by mouth in the morning and at bedtime.    [provider]  loteprednol (LOTEMAX) 0.5 % ophthalmic suspension Place 1 drop into both eyes 2 (two) times daily.    [provider]  methocarbamol (ROBAXIN) 500 MG tablet Take 1 tablet (500 mg total) by mouth every 6 (six) hours as needed for muscle spasms. 09/05/23   Kathryne Hitch, MD  oxyCODONE (OXY IR/ROXICODONE) 5 MG immediate release tablet Take 1-2 tablets (5-10 mg total) by mouth every 4 (four) hours as needed for moderate pain (pain score 4-6). 09/05/23   Kathryne Hitch, MD  pantoprazole (PROTONIX) 40 MG tablet Take 40 mg by mouth at bedtime. 08/30/18    [provider]      Allergies    Penicillins    Review of Systems   Review of Systems  Physical Exam Updated Vital Signs BP (!) 156/65   Pulse 80   Temp 98.2 F (36.8 C) (Tympanic)   Resp 15   Ht 1.6 m (5\' 3" )   Wt 105 kg   SpO2 100%   BMI 41.01 kg/m  Physical Exam Vitals and nursing note reviewed.  Constitutional:      Appearance: She is well-developed. She is ill-appearing.  HENT:     Head: Normocephalic and atraumatic.     Right Ear: External ear normal.     Left Ear: External ear normal.     Mouth/Throat:     Mouth: Angioedema present.     Comments: Edema of the tongue and at the base of the tongue, obstructing posterior pharynx Eyes:     General: No scleral icterus.       Right eye: No discharge.        Left eye: No discharge.     Conjunctiva/sclera: Conjunctivae normal.  Neck:     Trachea: No tracheal deviation.  Cardiovascular:     Rate and Rhythm: Normal rate and regular rhythm.  Pulmonary:     Effort: Pulmonary effort is normal. No respiratory distress.     Breath sounds: Normal breath sounds. No stridor. No wheezing or rales.  Abdominal:  General: Bowel sounds are normal. There is no distension.     Palpations: Abdomen is soft.     Tenderness: There is no abdominal tenderness. There is no guarding or rebound.  Musculoskeletal:        General: No tenderness or deformity.     Cervical back: Neck supple.  Skin:    General: Skin is warm and dry.     Findings: No rash.  Neurological:     General: No focal deficit present.     Mental Status: She is alert.     Cranial Nerves: No cranial nerve deficit, dysarthria or facial asymmetry.     Sensory: No sensory deficit.     Motor: No abnormal muscle tone or seizure activity.     Coordination: Coordination normal.  Psychiatric:        Mood and Affect: Mood normal.     ED Results / Procedures / Treatments   Labs (all labs ordered are listed, but only abnormal results are displayed) Labs  Reviewed  CBC - Abnormal; Notable for the following components:      Result Value   Hemoglobin 10.5 (*)    HCT 33.8 (*)    MCH 25.3 (*)    All other components within normal limits  BASIC METABOLIC PANEL - Abnormal; Notable for the following components:   Glucose, Bld 113 (*)    BUN 25 (*)    Creatinine, Ser 1.25 (*)    GFR, Estimated 46 (*)    All other components within normal limits    EKG None  Radiology No results found.  Procedures .Critical Care  Performed by: Linwood Dibbles, MD Authorized by: Linwood Dibbles, MD   Critical care provider statement:    Critical care time (minutes):  30   Critical care was time spent personally by me on the following activities:  Development of treatment plan with patient or surrogate, discussions with consultants, evaluation of patient's response to treatment, examination of patient, ordering and review of laboratory studies, ordering and review of radiographic studies, ordering and performing treatments and interventions, pulse oximetry, re-evaluation of patient's condition and review of old charts     Medications Ordered in ED Medications  EPINEPHrine (EPI-PEN) injection 0.3 mg (0.3 mg Intramuscular Given 09/25/23 2127)  diphenhydrAMINE (BENADRYL) injection 25 mg (25 mg Intravenous Given 09/25/23 2129)  dexamethasone (DECADRON) injection 10 mg (10 mg Intravenous Given 09/25/23 2129)  famotidine (PEPCID) IVPB 20 mg premix (0 mg Intravenous Stopped 09/25/23 2201)    ED Course/ Medical Decision Making/ A&P Clinical Course as of 09/25/23 2222  Sun Sep 25, 2023  2205 CBC(!) Hemoglobin slightly decreased compared to last.  Normal white blood cell count [JK]  2213 Patient states she feels like it might be getting a little better.  She is having less difficulty speaking [JK]  2221 Pt still breathing easily.  Feels like the edema may be improving [JK]  2222 [facility] is here for transfer [JK]  2222 Basic metabolic panel(!) Creatinine increased since  previous [JK]    Clinical Course User Index [JK] Linwood Dibbles, MD                                 Medical Decision Making Problems Addressed: Angioedema, initial encounter: acute illness or injury  Amount and/or Complexity of Data Reviewed Labs: ordered. Decision-making details documented in ED Course.  Risk Prescription drug management. Decision regarding hospitalization.   Patient presented to  the ED for evaluation of acute angioedema.  Patient denies any new medications.  No prior history of same.  She is not on an ACE inhibitor.  Patient's only new food with some type of mustard.  She clearly has significant angioedema of her tongue.  It is affecting her ability to speak up right now she is still breathing without evidence of stridor or retractions.  Concerned about worsening of her angioedema but it might eventually compromise her airway.  Patient is currently at the freestanding ED and I think she will need continued monitoring and is at risk for requiring airway intervention including surgical airway intervention if she does not improve.  I have spoken with Dr. Judeth Horn at [facility].  We will transfer to the ICU for further treatment.  Patient has been given IV Benadryl, steroids episode and epinephrine       Final Clinical Impression(s) / ED Diagnoses Final diagnoses:  Angioedema, initial encounter    Rx / DC Orders ED Discharge Orders     None         Linwood Dibbles, MD 09/25/23 2222

## 2023-09-25 NOTE — ED Triage Notes (Signed)
Pt noted tongue swelling at 2000, swelling noted in triage. State only allergic to penicillin.

## 2023-09-26 DIAGNOSIS — T783XXA Angioneurotic edema, initial encounter: Secondary | ICD-10-CM | POA: Diagnosis not present

## 2023-09-26 DIAGNOSIS — I10 Essential (primary) hypertension: Secondary | ICD-10-CM | POA: Insufficient documentation

## 2023-09-26 DIAGNOSIS — M25561 Pain in right knee: Secondary | ICD-10-CM

## 2023-09-26 LAB — CBC
HCT: 33.8 % — ABNORMAL LOW (ref 36.0–46.0)
Hemoglobin: 10.5 g/dL — ABNORMAL LOW (ref 12.0–15.0)
MCH: 24.8 pg — ABNORMAL LOW (ref 26.0–34.0)
MCHC: 31.1 g/dL (ref 30.0–36.0)
MCV: 79.9 fL — ABNORMAL LOW (ref 80.0–100.0)
Platelets: 247 10*3/uL (ref 150–400)
RBC: 4.23 MIL/uL (ref 3.87–5.11)
RDW: 14.7 % (ref 11.5–15.5)
WBC: 8.4 10*3/uL (ref 4.0–10.5)
nRBC: 0 % (ref 0.0–0.2)

## 2023-09-26 LAB — GLUCOSE, CAPILLARY
Glucose-Capillary: 171 mg/dL — ABNORMAL HIGH (ref 70–99)
Glucose-Capillary: 125 mg/dL — ABNORMAL HIGH (ref 70–99)
Glucose-Capillary: 210 mg/dL — ABNORMAL HIGH (ref 70–99)

## 2023-09-26 LAB — MRSA NEXT GEN BY PCR, NASAL: MRSA by PCR Next Gen: NOT DETECTED

## 2023-09-26 LAB — CREATININE, SERUM
Creatinine, Ser: 1.11 mg/dL — ABNORMAL HIGH (ref 0.44–1.00)
GFR, Estimated: 53 mL/min — ABNORMAL LOW (ref >60)

## 2023-09-26 MED ORDER — MORPHINE SULFATE (PF) 2 MG/ML IV SOLN
2.0000 mg | Freq: Once | INTRAVENOUS | Status: AC
  Administered 2023-09-26: 2 mg via INTRAVENOUS
  Filled 2023-09-26: qty 1

## 2023-09-26 MED ORDER — OXYCODONE HCL 5 MG PO TABS
5.0000 mg | ORAL_TABLET | Freq: Four times a day (QID) | ORAL | Status: DC | PRN
  Filled 2023-09-26: qty 1

## 2023-09-26 MED ORDER — TRAMADOL HCL 50 MG PO TABS
50.0000 mg | ORAL_TABLET | Freq: Four times a day (QID) | ORAL | Status: DC | PRN
  Administered 2023-09-26: 50 mg via ORAL
  Filled 2023-09-26: qty 1

## 2023-09-26 MED ORDER — DILTIAZEM HCL ER COATED BEADS 180 MG PO CP24
180.0000 mg | ORAL_CAPSULE | Freq: Every day | ORAL | Status: DC
  Administered 2023-09-26 – 2023-09-27 (×2): 180 mg via ORAL
  Filled 2023-09-26 (×2): qty 1

## 2023-09-26 MED ORDER — DIPHENHYDRAMINE HCL 25 MG PO CAPS: 25.0000 mg | ORAL_CAPSULE | Freq: Four times a day (QID) | ORAL | Status: DC | PRN

## 2023-09-26 MED ORDER — DEXAMETHASONE 4 MG PO TABS
4.0000 mg | ORAL_TABLET | Freq: Four times a day (QID) | ORAL | Status: DC
  Administered 2023-09-26 – 2023-09-27 (×3): 4 mg via ORAL
  Filled 2023-09-26 (×7): qty 1

## 2023-09-26 MED ORDER — INSULIN ASPART 100 UNIT/ML IJ SOLN
0.0000 [IU] | Freq: Three times a day (TID) | INTRAMUSCULAR | Status: DC
  Administered 2023-09-27: 3 [IU] via SUBCUTANEOUS

## 2023-09-26 NOTE — Progress Notes (Signed)
Doing well No issue swallowing Feels "pretty much normal" Pharmacy has reviewed epi-pen w/ pt  Plan Adv diet Transfer to [facility] tomorrow if no issues overnight

## 2023-09-26 NOTE — TOC CM/SW Note (Signed)
Transition of Care [facility]) - Inpatient Brief Assessment   Patient Details  Name: Diana Bowers MRN: [id number] Date of Birth: [birth year]  Transition of Care [facility]) CM/SW Contact:    Gala Lewandowsky, RN Phone Number: 09/26/2023, 1:23 PM   Clinical Narrative: Patient presented for angioedema. Patient has insurance and PCP. Case Manager will continue to follow for transition of care needs as the patient progresses.    Transition of Care Asessment: Insurance and Status: Insurance coverage has been reviewed Patient has primary care physician: Yes Prior/Current Home Services: No current home services Social Determinants of Health Reivew: SDOH reviewed no interventions necessary Readmission risk has been reviewed: Yes Transition of care needs: no transition of care needs at this time

## 2023-09-26 NOTE — Progress Notes (Addendum)
NAME:  Diana Bowers, MRN:  [id number], DOB:  09/27/1951, LOS: 1 ADMISSION DATE:  09/25/2023, CONSULTATION DATE:  09/26/23 REFERRING MD:  ED, CHIEF COMPLAINT:  tongue swelling   History of Present Illness:  72 year old presented to ED with sudden onset tongue swelling presumed allergic reaction to honey mustard.  In usual state of health.  Tongue swelling in the evening 9/29.  Presented to the ED very quickly.  Not on ACE inhibitor.  No new medications.  Recent knee replacement on aspirin for reported DVT prophylaxis. was started weeks ago.  Had new food or condiment this evening.  Honey mustard.  Seen in the ED.  Given dexamethasone, epinephrine, Pepcid, Benadryl.  Transferred to ICU at [facility] for med [facility] for close monitoring.  At time of admission, patient reports mild improvement in swelling as well as improvement in dysarthria.  Labs L for AKI and mild worsening anemia postoperatively.  Pertinent  Medical History  As per EMR  Significant Hospital Events: Including procedures, antibiotic start and stop dates in addition to other pertinent events   09/25/2023 admitted to the hospital with presumed angioedema from allergic reaction, tongue swelling after honey mustard ingestion (new brand or complement) 9/30 tongue swelling resolved. Still w/ some numbness at tip of tongue   Interim History / Subjective:  Feels better. Still feels like tongue is numb   Objective   Blood pressure 132/83, pulse 69, temperature 98.1 F (36.7 C), temperature source Oral, resp. rate 12, height 5\' 3"  (1.6 m), weight 104.5 kg, SpO2 93%.        Intake/Output Summary (Last 24 hours) at 09/26/2023 0935 Last data filed at 09/26/2023 0600 Gross per 24 hour  Intake 616.43 ml  Output 700 ml  Net -83.57 ml   Filed Weights   09/25/23 2122 09/25/23 2300  Weight: 105 kg 104.5 kg    Examination: General sitting up in bed. No distress HENT NCAT no JVD no stridor. Tongue no longer appears  swollen. Pulm clear  Card rrr Abd soft Ext warm and dry  Neuro intact    Resolved Hospital Problem list   N/a  Assessment & Plan:  Angioedema: Sudden onset tongue swelling.  Not on ACE inhibitor.  No obvious medication culprits.  New food/condiment honey mustard on the evening prior to symptoms.  Suspect allergic reaction to this.  NSAIDs can rarely cause angioedema, aspirin considered.  Improvement with antihistamines, epinephrine, dexamethasone. Plan Dexamethasone 4 mg IV every 6 hours, Pepcid 20 mg IV every 12 hours, Benadryl 25 mg IV every 6 hours Do not administer aspirin at this time Start clear liq diet  Epi pen teaching  Consider allergy consult as outpt   AKI: Plan Cont  LR at 75 ml/hr, if tol POs will dc IV later this afternoon  Am chem   Anemia: Noted postoperatively with mild worsening on repeat labs but stable over last 24 hrs Plan Monitor    Hyperglycemia: Mild. Steroid induced Plan Sensitive SSI Goal 140-180  Q 4 h while NPO  Hypertension: Plan Resume her CCB  GERD: Plan Cont home PPI  Hyperlipidemia: Plan home statin  Best Practice (right click and "Reselect all SmartList Selections" daily)   Diet/type: clear liquids DVT prophylaxis: LMWH GI prophylaxis: H2B Lines: N/A Foley:  N/A Code Status:  full code Last date of multidisciplinary goals of care discussion [patient updated at bedside on admission]  Will circle back this afternoon. If no issues will move out of ICU w/ plan to  probably dc tomorrow   Critical care time: NA    CRITICAL CARE Performed by: Shelby Mattocks

## 2023-09-26 NOTE — Progress Notes (Signed)
eLink Physician-Brief Progress Note Patient Name: Diana Bowers DOB: 06-21-1951 MRN: [id number]   Date of Service  09/26/2023  HPI/Events of Note  72 y/o woman with a history of HTN, HLD, recent R knee replacement who presented with tongue swelling that began after eating honey BBQ sauce   Tongue swelling improved transferred out of ICU  eICU Interventions  Initiate CPAP nightly        Tion Tse 09/26/2023, 9:41 PM

## 2023-09-26 NOTE — Discharge Summary (Incomplete)
Physician Discharge Summary         Patient ID: Diana Bowers MRN: [id number] DOB/AGE: 1951-11-06 72 y.o.  Admit date: 09/25/2023 Discharge date: 09/26/2023  Discharge Diagnoses:    Active Hospital Problems   Diagnosis Date Noted   Angioedema 09/25/2023    Priority: 1.   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      Discharge summary     72 year old presented to ED with sudden onset tongue swelling presumed allergic reaction to honey mustard.   In usual state of health.  Tongue swelling in the evening 9/29.  Presented to the ED very quickly.  Not on ACE inhibitor.  No new medications.  Recent knee replacement on aspirin for reported DVT prophylaxis. was started weeks ago.  Had new food or condiment this evening.  Honey mustard.  Seen in the ED.  Given dexamethasone, epinephrine, Pepcid, Benadryl.  Transferred to ICU at [facility] for med [facility] for close monitoring.  At time of admission, patient reports mild improvement in swelling as well as improvement in dysarthria.   Hospital course  Admitted to ICU. Therapeutic interventions included: systemic steroids, Histamine blockade, IV hydration, continued pulse oximetry monitoring and close observation. Her symptoms improved over night and by 9/30 am hours there was no longer any evidence angioedema. She did have "numb sensation on tongue". She was started on clear liquids, diet advanced and she was moved out of the ICU later that day. While awaiting dc she was again seen by PT as she had was supposed to start her post-op PT this week at [facility].  She was deemed ready for dc as of 10/1 w/ plan as outlined below.  Discharge Plan by Active Problems     Angioedema  Felt secondary to allergic reaction from mustard seeds Plan Avoid mustard. List as allergy  Taper decadron over 5d Cont pepcid while on decadron   Benadryl PRN  Epipen PRN  Needs a referral for allergy testing   HTN Plan Back home oh her regular meds  S/p TKR Plan Cont asa  Cont PT  Cont pain rx as outlined by ortho   Discharge Exam: BP 127/79   Pulse 71   Temp 98.4 F (36.9 C) (Oral)   Resp (!) 31   Ht 5\' 3"  (1.6 m)   Wt 104.5 kg   SpO2 91%   BMI 40.81 kg/m   ****** Labs at discharge   Lab Results  Component Value Date   CREATININE 1.11 (H) 09/26/2023   BUN 25 (H) 09/25/2023   NA 139 09/25/2023   K 4.0 09/25/2023   CL 104 09/25/2023   CO2 23 09/25/2023   Lab Results  Component Value Date   WBC 8.4 09/26/2023   HGB 10.5 (L) 09/26/2023   HCT 33.8 (L) 09/26/2023   MCV 79.9 (L) 09/26/2023   PLT 247 09/26/2023   Lab Results  Component Value Date   ALT 15 08/23/2023   AST 19 08/23/2023   ALKPHOS 101 08/23/2023   BILITOT 0.4 08/23/2023   Lab Results  Component Value Date   INR 0.99 12/25/2012    Current radiological studies    No results found.  Disposition:     There are no questions and answers to display.          Allergies as of 09/26/2023       Reactions  Penicillins Hives   Has patient had a PCN reaction causing immediate rash, facial/tongue/throat swelling, SOB or lightheadedness with hypotension:Yes Has patient had a PCN reaction causing severe rash involving mucus membranes or skin necrosis: No Has patient had a PCN reaction that required hospitalization: No Has patient had a PCN reaction occurring within the last 10 years: Unknown If all of the above answers are "NO", then may proceed with Cephalosporin use.     Med Rec must be completed prior to using this [facility]***        Follow-up appointment   *** Discharge Condition:    {condition:18240}  Physician Statement:   The Patient was personally examined, the discharge assessment and plan has been personally reviewed and I agree with ACNP Connell Bognar's assessment and plan. *** minutes of time have been dedicated  to discharge assessment, planning and discharge instructions.   Signed: Shelby Mattocks 09/26/2023, 12:15 PM

## 2023-09-27 DIAGNOSIS — G4733 Obstructive sleep apnea (adult) (pediatric): Secondary | ICD-10-CM | POA: Diagnosis not present

## 2023-09-27 DIAGNOSIS — T783XXD Angioneurotic edema, subsequent encounter: Secondary | ICD-10-CM | POA: Diagnosis not present

## 2023-09-27 LAB — GLUCOSE, CAPILLARY
Glucose-Capillary: 132 mg/dL — ABNORMAL HIGH (ref 70–99)
Glucose-Capillary: 162 mg/dL — ABNORMAL HIGH (ref 70–99)
Glucose-Capillary: 248 mg/dL — ABNORMAL HIGH (ref 70–99)

## 2023-09-27 MED ORDER — EPINEPHRINE 0.3 MG/0.3ML IJ SOAJ: 0.3000 mg | INTRAMUSCULAR | 2 refills | Status: AC | PRN

## 2023-09-27 MED ORDER — DEXAMETHASONE 1 MG PO TABS
ORAL_TABLET | ORAL | 0 refills | Status: AC
Start: 2023-09-27 — End: 2023-09-30

## 2023-09-27 NOTE — Progress Notes (Signed)
   09/27/23 0019  BiPAP/CPAP/SIPAP  $ Non-Invasive Ventilator  Non-Invasive Vent Initial  $ Face Mask Medium Yes  BiPAP/CPAP/SIPAP Pt Type Adult  BiPAP/CPAP/SIPAP Resmed  Mask Type Full face mask  Mask Size Medium  EPAP 4 cmH2O  PEEP 4 cmH20  FiO2 (%) 21 %  Flow Rate 0 lpm  Patient Home Equipment No  Auto Titrate No  Nasal massage performed Yes  CPAP/SIPAP surface wiped down Yes  Safety Check Completed by RT for Home Unit Yes, no issues noted

## 2023-09-27 NOTE — Progress Notes (Signed)
AVS given and reviewed with pt. Medications discussed. All questions answered to satisfaction. Pt verbalized understanding of information given. Pt escorted off the unit with all belongings via wheelchair by this RN.  

## 2023-09-28 ENCOUNTER — Ambulatory Visit: Payer: Medicare Other | Attending: Orthopaedic Surgery

## 2023-09-28 ENCOUNTER — Other Ambulatory Visit: Payer: Self-pay

## 2023-09-28 DIAGNOSIS — M25661 Stiffness of right knee, not elsewhere classified: Secondary | ICD-10-CM | POA: Insufficient documentation

## 2023-09-28 DIAGNOSIS — R6889 Other general symptoms and signs: Secondary | ICD-10-CM | POA: Insufficient documentation

## 2023-09-28 DIAGNOSIS — R262 Difficulty in walking, not elsewhere classified: Secondary | ICD-10-CM | POA: Insufficient documentation

## 2023-09-28 DIAGNOSIS — Z96651 Presence of right artificial knee joint: Secondary | ICD-10-CM | POA: Insufficient documentation

## 2023-09-28 NOTE — Therapy (Signed)
OUTPATIENT PHYSICAL THERAPY LOWER EXTREMITY EVALUATION   Patient Name: Diana Bowers MRN: [id number] DOB:October 22, 1951, 72 y.o., female Today's Date: 09/28/2023  END OF SESSION:  PT End of Session - 09/28/23 1429     Visit Number 1    Date for PT Re-Evaluation 11/23/23    Progress Note Due on Visit 10    PT Start Time 1550    PT Stop Time 1640    PT Time Calculation (min) 50 min    Activity Tolerance Patient tolerated treatment well    Behavior During Therapy [facility] for tasks assessed/performed             Past Medical History:  Diagnosis Date   Anemia    Arthritis    "left knee" (01/01/2013)   Diabetes mellitus without complication (HCC)    borderline no med   DVT of lower extremity (deep venous thrombosis) (HCC) 12/27/2005   "LLE; from birth control pill" (01/01/2013)   GERD (gastroesophageal reflux disease)    Headache    pt. states migraines at times   Hypercholesteremia    Crestor   Hypertension    Past Surgical History:  Procedure Laterality Date   ANTERIOR CERVICAL DECOMP/DISCECTOMY FUSION  2006   CARPAL TUNNEL RELEASE  2000   "right" (01/01/2013)   CESAREAN SECTION  1978   COLONOSCOPY     ESOPHAGOGASTRODUODENOSCOPY     I & D KNEE WITH POLY EXCHANGE Left 09/15/2018   Procedure: POLY EXCHANGE LEFT KNEE,/,PATELLAR COMPONENT REVISION  STEROID INJECTION LEFT HIP;  Surgeon: Kathryne Hitch, MD;  Location: [facility];  Service: Orthopedics;  Laterality: Left;   KIDNEY DONATION  1994   KNEE ARTHROPLASTY  01/01/2013   Procedure: COMPUTER ASSISTED TOTAL KNEE ARTHROPLASTY;  Surgeon: Kerrin Champagne, MD;  Location: [facility] OR;  Service: Orthopedics;  Laterality: Left;  Left computer assisted total knee replacement   LIPOMA EXCISION  2000   back   REPLACEMENT TOTAL KNEE  01/01/2013   "left" (01/01/2013)   TOTAL HIP ARTHROPLASTY Right 07/22/2015   Procedure: RIGHT TOTAL HIP ARTHROPLASTY ANTERIOR APPROACH;  Surgeon: Kathryne Hitch, MD;  Location: [facility] OR;  Service: Orthopedics;   Laterality: Right;   TOTAL KNEE ARTHROPLASTY Right 09/02/2023   Procedure: RIGHT TOTAL KNEE ARTHROPLASTY;  Surgeon: Kathryne Hitch, MD;  Location: [facility];  Service: Orthopedics;  Laterality: Right;   Patient Active Problem List   Diagnosis Date Noted   OSA on CPAP 09/27/2023   Hypertension 09/26/2023   Acute pain of right knee 09/26/2023   Angio-edema 09/25/2023   S/P total knee replacement 09/03/2023   Status post total right knee replacement 09/02/2023   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   Status post revision of total replacement of left knee 09/15/2018   Trochanteric bursitis, left hip 08/10/2017   Osteoarthritis of right hip 07/22/2015   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  Has recovered well from hospitalization, slept well in her own bed last night for the first time in 6 weeks.  PERTINENT HISTORY: Underwent elective R TKA 09/02/23.  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  Home 09/27/23.  Completed home health PT prior to her allergic reaction. PAIN:  Are you having pain? Yes: NPRS scale: 0-5/10 Pain location: R knee Pain description: aches, Aggravating factors: movement Relieving factors: ice, meds  PRECAUTIONS: None  RED FLAGS: None   WEIGHT BEARING RESTRICTIONS: No  FALLS:  Has patient fallen in last 6 months? No  LIVING ENVIRONMENT: Lives with: lives with their spouse Lives in: House/apartment Stairs: Yes: External: 2 steps; on  right going up Has following equipment at home: None  OCCUPATION: retired  PLOF: Independent  PATIENT GOALS: [facility], want to go back to walking and exercises, water aerobics  NEXT MD VISIT: 09/13/23  OBJECTIVE:  Note: Objective measures were completed at Evaluation unless otherwise noted.  DIAGNOSTIC FINDINGS: na  PATIENT SURVEYS:  KOOS Jr: 15/28  COGNITION: Overall cognitive status: Within functional limits for tasks assessed     SENSATION: WFL  EDEMA:  Mod R knee, medially  POSTURE: flexed B hips, maintains extended R knee in standing, steristrips R ant knee, scar forming normally  PALPATION: Tender R patella  LOWER EXTREMITY ROM:  AAROM Right eval Left eval  Hip flexion    Hip extension    Hip abduction    Hip adduction    Hip internal rotation    Hip external rotation    Knee flexion 90   Knee extension 0   Ankle dorsiflexion    Ankle plantarflexion    Ankle inversion    Ankle eversion     (Blank rows = wfl)  LOWER EXTREMITY MMT: seated R LAQ 12 degree lag R SLR with 31 degree lag  MMT R quads 3- R hams 4- R hip flexion 3+   FUNCTIONAL TESTS:  Timed up and go (TUG): 24 6 minute walk test: 2 min 30 sec  GAIT: Distance walked: 180 Assistive device utilized: Walker - 2 wheeled Level of assistance: Modified independence Comments: decreased L hip extension noted. R knee with excellent extension in stance, tends to lock B knees into extension, shortened step length B   TODAY'S TREATMENT:                                                                                                                              DATE: 09/28/23: Eval, then therex:  Nustep level 3, UE and LE 5 min Seated for long arc quads with theraband assist for terminal knee ext Standing for R SLR Standing for heel /toe rocks Gait x 3 laps with r walker, for 2 min, 30 sec    PATIENT EDUCATION:  Education details: POC, goals Person educated:  Patient Education method: Programmer, multimedia, Demonstration, Actor cues, Verbal cues, and Handouts Education comprehension: verbalized understanding, returned demonstration, verbal cues required, and tactile cues required  HOME EXERCISE PROGRAM: Access Code: [email or web address] URL: [email or web address] Date: 09/28/2023 Prepared by: Caralee Ates  Exercises - Standing Hip  Flexion with Counter Support  - 1 x daily - 7 x weekly - 3 sets - 10 reps - Heel Toe Raises with Unilateral Counter Support  - 1 x daily - 7 x weekly - 3 sets - 10 reps - Seated Long Arc Quad with Strap  - 1 x daily - 7 x weekly - 3 sets - 10 reps+  ASSESSMENT:  CLINICAL IMPRESSION: Patient is a 72 y.o. female who was seen today for physical therapy evaluation and treatment for s/p R TKA.  She is 3 1/2 weeks post op from R TKA. Has been hospitalized from 09/25/23 to 09/27/23, due to angioedema from allergic reaction to eating honey mustard.  She presents with decreased ROM R knee, decreased strength R LE particularly R quads, also altered endurance. She is reliant on front wheeled walker currently for gait.  She is very well motivated and typically very active. Will benefit from skilled PT to address her recovery of function R knee.  OBJECTIVE IMPAIRMENTS: decreased activity tolerance, decreased endurance, decreased mobility, difficulty walking, decreased ROM, decreased strength, increased edema, increased fascial restrictions, impaired perceived functional ability, impaired flexibility, and pain.   ACTIVITY LIMITATIONS: carrying, lifting, standing, squatting, stairs, transfers, bed mobility, and locomotion level  PARTICIPATION LIMITATIONS: meal prep, cleaning, laundry, driving, shopping, community activity, yard work, and church  PERSONAL FACTORS: Behavior pattern, Fitness, Past/current experiences, Time since onset of injury/illness/exacerbation, and 1-2 comorbidities: recent angioedema due to rxn to mustard, h/o R TKA, h/o L  THA  are also affecting patient's functional outcome.   REHAB POTENTIAL: Good  CLINICAL DECISION MAKING: Stable/uncomplicated  EVALUATION COMPLEXITY: Low   GOALS: Goals reviewed with patient? Yes  SHORT TERM GOALS: Target date: 10/12/23 I HEP Baseline: Goal status: INITIAL   LONG TERM GOALS: Target date: 11/23/23:   KOOS Jr:  Baseline: 15/28 Goal status: INITIAL  2.  ROM R knee -2 to 115 Baseline: 0 to 90 Goal status: INITIAL  3.  Strength R LE quads, hamstrings 4+/5 with MMT for efficiency with transfers Baseline: 3-/5 Goal status: INITIAL  4.  TUG score 14 sec or less for reduced fall risk Baseline:  Goal status: INITIAL  5.  6 min walk test, with minimal to no exertion, for improved ability to perform community ambulation Baseline: walked 2 min 30 sec with r walker then fatigued Goal status: INITIAL   PLAN:  PT FREQUENCY: 2x/week  PT DURATION: 8 weeks  PLANNED INTERVENTIONS: Therapeutic exercises, Therapeutic activity, Neuromuscular re-education, Balance training, Gait training, Patient/Family education, Self Care, and Joint mobilization  PLAN FOR NEXT SESSION: continue with endurance activities, progress with stretching for R knee flexion, progress strengthening R LE , gait, add proprioceptive activities as appropriate, may try NMES for quads recruitment   Jermany Sundell L Tamika Nou, PT, DPT, OCS 09/28/2023, 4:51 PM

## 2023-09-30 ENCOUNTER — Ambulatory Visit: Payer: Medicare Other | Admitting: Physical Therapy

## 2023-09-30 DIAGNOSIS — R6889 Other general symptoms and signs: Secondary | ICD-10-CM

## 2023-09-30 DIAGNOSIS — R262 Difficulty in walking, not elsewhere classified: Secondary | ICD-10-CM | POA: Diagnosis not present

## 2023-09-30 DIAGNOSIS — M25661 Stiffness of right knee, not elsewhere classified: Secondary | ICD-10-CM

## 2023-09-30 NOTE — Therapy (Signed)
OUTPATIENT PHYSICAL THERAPY LOWER EXTREMITY    Patient Name: Diana Bowers MRN: [id number] DOB:01/27/1951, 72 y.o., female Today's Date: 09/30/2023  END OF SESSION:  PT End of Session - 09/30/23 0920     Visit Number 2    Date for PT Re-Evaluation 11/23/23    PT Start Time 0920    PT Stop Time 1010    PT Time Calculation (min) 50 min             Past Medical History:  Diagnosis Date   Anemia    Arthritis    "left knee" (01/01/2013)   Diabetes mellitus without complication (HCC)    borderline no med   DVT of lower extremity (deep venous thrombosis) (HCC) 12/27/2005   "LLE; from birth control pill" (01/01/2013)   GERD (gastroesophageal reflux disease)    Headache    pt. states migraines at times   Hypercholesteremia    Crestor   Hypertension    Past Surgical History:  Procedure Laterality Date   ANTERIOR CERVICAL DECOMP/DISCECTOMY FUSION  2006   CARPAL TUNNEL RELEASE  2000   "right" (01/01/2013)   CESAREAN SECTION  1978   COLONOSCOPY     ESOPHAGOGASTRODUODENOSCOPY     I & D KNEE WITH POLY EXCHANGE Left 09/15/2018   Procedure: POLY EXCHANGE LEFT KNEE,/,PATELLAR COMPONENT REVISION  STEROID INJECTION LEFT HIP;  Surgeon: Kathryne Hitch, MD;  Location: [facility];  Service: Orthopedics;  Laterality: Left;   KIDNEY DONATION  1994   KNEE ARTHROPLASTY  01/01/2013   Procedure: COMPUTER ASSISTED TOTAL KNEE ARTHROPLASTY;  Surgeon: Kerrin Champagne, MD;  Location: [facility];  Service: Orthopedics;  Laterality: Left;  Left computer assisted total knee replacement   LIPOMA EXCISION  2000   back   REPLACEMENT TOTAL KNEE  01/01/2013   "left" (01/01/2013)   TOTAL HIP ARTHROPLASTY Right 07/22/2015   Procedure: RIGHT TOTAL HIP ARTHROPLASTY ANTERIOR APPROACH;  Surgeon: Kathryne Hitch, MD;  Location: [facility];  Service: Orthopedics;  Laterality: Right;   TOTAL KNEE ARTHROPLASTY Right 09/02/2023   Procedure: RIGHT TOTAL KNEE ARTHROPLASTY;  Surgeon: Kathryne Hitch, MD;  Location: [facility];   Service: Orthopedics;  Laterality: Right;   Patient Active Problem List   Diagnosis Date Noted   OSA on CPAP 09/27/2023   Hypertension 09/26/2023   Acute pain of right knee 09/26/2023   Angio-edema 09/25/2023   S/P total knee replacement 09/03/2023   Status post total right knee replacement 09/02/2023   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   Status post revision of total replacement of left knee 09/15/2018   Trochanteric bursitis, left hip 08/10/2017   Osteoarthritis of right hip 07/22/2015   Status post total replacement of right hip 07/22/2015   Acute blood loss anemia 01/04/2013    PCP: Caffie Damme, MD  REFERRING PROVIDER: Doneen Poisson, MD  REFERRING DIAG: s/p R TKA  THERAPY DIAG:  Difficulty in walking, not elsewhere classified  Stiffness of right knee, not elsewhere classified  Decreased functional activity tolerance  Rationale for Evaluation and Treatment: Rehabilitation  ONSET DATE: 09/03/23  SUBJECTIVE:   SUBJECTIVE STATEMENT: Nights are rough but doing good. Amb in well with RW PERTINENT HISTORY: Underwent elective R TKA 09/02/23.  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  Home 09/27/23.  Completed home health PT prior to her allergic reaction. PAIN:  Are you having pain? Yes: NPRS scale: 0-5/10 Pain location: R knee Pain description: aches, Aggravating factors: movement  Relieving factors: ice, meds  PRECAUTIONS: None  RED FLAGS: None   WEIGHT BEARING RESTRICTIONS: No  FALLS:  Has patient fallen in last 6 months? No  LIVING ENVIRONMENT: Lives with: lives with their spouse Lives in: House/apartment Stairs: Yes: External: 2 steps; on right going up Has following equipment at home: None  OCCUPATION: retired  PLOF: Independent  PATIENT GOALS: I love [facility] to [facility], want to go back to walking and exercises, water aerobics  NEXT MD VISIT: 09/13/23  OBJECTIVE:  Note:  Objective measures were completed at Evaluation unless otherwise noted.  DIAGNOSTIC FINDINGS: na  PATIENT SURVEYS:  KOOS Jr: 15/28  COGNITION: Overall cognitive status: Within functional limits for tasks assessed     SENSATION: WFL  EDEMA:  Mod R knee, medially  POSTURE: flexed B hips, maintains extended R knee in standing, steristrips R ant knee, scar forming normally  PALPATION: Tender R patella  LOWER EXTREMITY ROM:  AAROM Right eval Left eval  Hip flexion    Hip extension    Hip abduction    Hip adduction    Hip internal rotation    Hip external rotation    Knee flexion 90   Knee extension 0   Ankle dorsiflexion    Ankle plantarflexion    Ankle inversion    Ankle eversion     (Blank rows = wfl)  LOWER EXTREMITY MMT: seated R LAQ 12 degree lag R SLR with 31 degree lag  MMT R quads 3- R hams 4- R hip flexion 3+   FUNCTIONAL TESTS:  Timed up and go (TUG): 24 6 minute walk test: 2 min 30 sec  GAIT: Distance walked: 180 Assistive device utilized: Walker - 2 wheeled Level of assistance: Modified independence Comments: decreased L hip extension noted. R knee with excellent extension in stance, tends to lock B knees into extension, shortened step length B   TODAY'S TREATMENT:                                                                                                                              DATE:   09/30/23 Nustep L 4 LE only PROM flex and ext AROM after 0-103 Green tband HS curl 2 sets 10 3# LAQ 2 sets 10 Step tap fwd and laterally 6 inch 10 x each Step up 4inch fwd and laterally 10 x each    09/28/23: Eval, then therex:  Nustep level 3, UE and LE 5 min Seated for long arc quads with theraband assist for terminal knee ext Standing for R SLR Standing for heel /toe rocks Gait x 3 laps with r walker, for 2 min, 30 sec    PATIENT EDUCATION:  Education details: POC, goals Person educated: Patient Education method: Programmer, multimedia,  Facilities manager, Actor cues, Verbal cues, and Handouts Education comprehension: verbalized understanding, returned demonstration, verbal cues required, and tactile cues required  HOME EXERCISE PROGRAM: Access Code: [email or web address] URL: [email or web address] Date: 09/28/2023 Prepared by: Linton Rump  Speaks  Exercises - Standing Hip Flexion with Counter Support  - 1 x daily - 7 x weekly - 3 sets - 10 reps - Heel Toe Raises with Unilateral Counter Support  - 1 x daily - 7 x weekly - 3 sets - 10 reps - Seated Long Arc Quad with Strap  - 1 x daily - 7 x weekly - 3 sets - 10 reps+  ASSESSMENT:  CLINICAL IMPRESSION: STG met as she is doing HEP form rehab. Progressed ROM, ex, gait with SPC and stairs. Cuing and assist needed. Asked pt to start using SPC at home   OBJECTIVE IMPAIRMENTS: decreased activity tolerance, decreased endurance, decreased mobility, difficulty walking, decreased ROM, decreased strength, increased edema, increased fascial restrictions, impaired perceived functional ability, impaired flexibility, and pain.   ACTIVITY LIMITATIONS: carrying, lifting, standing, squatting, stairs, transfers, bed mobility, and locomotion level  PARTICIPATION LIMITATIONS: meal prep, cleaning, laundry, driving, shopping, community activity, yard work, and church  PERSONAL FACTORS: Behavior pattern, Fitness, Past/current experiences, Time since onset of injury/illness/exacerbation, and 1-2 comorbidities: recent angioedema due to rxn to mustard, h/o R TKA, h/o L THA  are also affecting patient's functional outcome.   REHAB POTENTIAL: Good  CLINICAL DECISION MAKING: Stable/uncomplicated  EVALUATION COMPLEXITY: Low   GOALS: Goals reviewed with patient? Yes  SHORT TERM GOALS: Target date: 10/12/23 I HEP Baseline: Goal status: from rehab 09/30/23   LONG TERM GOALS: Target date: 11/23/23:   KOOS Jr:  Baseline: 15/28 Goal status: INITIAL  2.  ROM R knee -2 to 115 Baseline: 0 to 90 Goal  status: INITIAL  3.  Strength R LE quads, hamstrings 4+/5 with MMT for efficiency with transfers Baseline: 3-/5 Goal status: INITIAL  4.  TUG score 14 sec or less for reduced fall risk Baseline:  Goal status: INITIAL  5.  6 min walk test, with minimal to no exertion, for improved ability to perform community ambulation Baseline: walked 2 min 30 sec with r walker then fatigued Goal status: INITIAL   PLAN:  PT FREQUENCY: 2x/week  PT DURATION: 8 weeks  PLANNED INTERVENTIONS: Therapeutic exercises, Therapeutic activity, Neuromuscular re-education, Balance training, Gait training, Patient/Family education, Self Care, and Joint mobilization  PLAN FOR NEXT SESSION: continue with endurance activities, progress with stretching for R knee flexion, progress strengthening R LE , gait, add proprioceptive activities as appropriate, may try NMES for quads recruitment   Mansoor Hillyard,ANGIE, PTA 09/30/2023, 9:20 AM [facility]. [place], [place], [address] Phone: [phone]   Fax:  [phone]  Patient Details  Name: UYEN SHELLY MRN: [id number] Date of Birth: 1951-08-30 Referring Provider:  Kathryne Hitch*  Encounter Date: 09/30/2023

## 2023-10-03 ENCOUNTER — Ambulatory Visit: Payer: Medicare Other

## 2023-10-03 ENCOUNTER — Other Ambulatory Visit: Payer: Self-pay

## 2023-10-03 DIAGNOSIS — R6889 Other general symptoms and signs: Secondary | ICD-10-CM

## 2023-10-03 DIAGNOSIS — R262 Difficulty in walking, not elsewhere classified: Secondary | ICD-10-CM | POA: Diagnosis not present

## 2023-10-03 DIAGNOSIS — M25661 Stiffness of right knee, not elsewhere classified: Secondary | ICD-10-CM

## 2023-10-03 NOTE — Therapy (Signed)
Patient Details  Name: Diana Bowers MRN: [id number] Date of Birth: [birth year] Referring Provider:  Kathryne Hitch*  Encounter Date: 10/03/2023  OUTPATIENT PHYSICAL THERAPY LOWER EXTREMITY TREATMENT   Patient Name: Diana Bowers MRN: [id number] DOB:April 07, 1951, 72 y.o., female Today's Date: 10/03/2023  END OF SESSION:  PT End of Session - 10/03/23 1046     Visit Number 3    Date for PT Re-Evaluation 11/23/23    Progress Note Due on Visit 10    PT Start Time 1046    PT Stop Time 1130    PT Time Calculation (min) 44 min    Activity Tolerance Patient tolerated treatment well    Behavior During Therapy [facility] for tasks assessed/performed              Past Medical History:  Diagnosis Date   Anemia    Arthritis    "left knee" (01/01/2013)   Diabetes mellitus without complication (HCC)    borderline no med   DVT of lower extremity (deep venous thrombosis) (HCC) 12/27/2005   "LLE; from birth control pill" (01/01/2013)   GERD (gastroesophageal reflux disease)    Headache    pt. states migraines at times   Hypercholesteremia    Crestor   Hypertension    Past Surgical History:  Procedure Laterality Date   ANTERIOR CERVICAL DECOMP/DISCECTOMY FUSION  2006   CARPAL TUNNEL RELEASE  2000   "right" (01/01/2013)   CESAREAN SECTION  1978   COLONOSCOPY     ESOPHAGOGASTRODUODENOSCOPY     I & D KNEE WITH POLY EXCHANGE Left 09/15/2018   Procedure: POLY EXCHANGE LEFT KNEE,/,PATELLAR COMPONENT REVISION  STEROID INJECTION LEFT HIP;  Surgeon: Kathryne Hitch, MD;  Location: [facility];  Service: Orthopedics;  Laterality: Left;   KIDNEY DONATION  1994   KNEE ARTHROPLASTY  01/01/2013   Procedure: COMPUTER ASSISTED TOTAL KNEE ARTHROPLASTY;  Surgeon: Kerrin Champagne, MD;  [facility]: [facility];  Service: Orthopedics;  Laterality: Left;  Left computer assisted total knee replacement   LIPOMA EXCISION  2000   back   REPLACEMENT TOTAL KNEE  01/01/2013   "left" (01/01/2013)   TOTAL HIP ARTHROPLASTY Right 07/22/2015   Procedure: RIGHT TOTAL HIP ARTHROPLASTY ANTERIOR APPROACH;  Surgeon: Kathryne Hitch, MD;  [facility];  Service: Orthopedics;   Laterality: Right;   TOTAL KNEE ARTHROPLASTY Right 09/02/2023   Procedure: RIGHT TOTAL KNEE ARTHROPLASTY;  Surgeon: Kathryne Hitch, MD;  Location: [facility];  Service: Orthopedics;  Laterality: Right;   Patient Active Problem List   Diagnosis Date Noted   OSA on CPAP 09/27/2023   Hypertension 09/26/2023   Acute pain of right knee 09/26/2023   Angio-edema 09/25/2023   S/P total knee replacement 09/03/2023   Status post total right knee replacement 09/02/2023   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   Status post revision of total replacement of left knee 09/15/2018   Trochanteric bursitis, left hip 08/10/2017   Osteoarthritis of right hip 07/22/2015   Status post total replacement of right hip 07/22/2015   Acute blood loss anemia 01/04/2013    PCP: Caffie Damme, MD  REFERRING PROVIDER: Doneen Poisson, MD  REFERRING DIAG: s/p R TKA  THERAPY DIAG:  Difficulty in walking, not elsewhere classified  Stiffness of right knee, not elsewhere classified  Decreased functional activity tolerance  Rationale for Evaluation and Treatment: Rehabilitation  ONSET DATE: 09/03/23  SUBJECTIVE:   SUBJECTIVE STATEMENT: Feeling good, have allergy testing this week  PERTINENT HISTORY: Underwent elective R TKA 09/02/23.  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  Home 09/27/23.  Completed home health  OUTPATIENT PHYSICAL THERAPY LOWER EXTREMITY TREATMENT   Patient Name: Diana Bowers MRN: [id number] DOB:April 07, 1951, 72 y.o., female Today's Date: 10/03/2023  END OF SESSION:  PT End of Session - 10/03/23 1046     Visit Number 3    Date for PT Re-Evaluation 11/23/23    Progress Note Due on Visit 10    PT Start Time 1046    PT Stop Time 1130    PT Time Calculation (min) 44 min    Activity Tolerance Patient tolerated treatment well    Behavior During Therapy [facility] for tasks assessed/performed              Past Medical History:  Diagnosis Date   Anemia    Arthritis    "left knee" (01/01/2013)   Diabetes mellitus without complication (HCC)    borderline no med   DVT of lower extremity (deep venous thrombosis) (HCC) 12/27/2005   "LLE; from birth control pill" (01/01/2013)   GERD (gastroesophageal reflux disease)    Headache    pt. states migraines at times   Hypercholesteremia    Crestor   Hypertension    Past Surgical History:  Procedure Laterality Date   ANTERIOR CERVICAL DECOMP/DISCECTOMY FUSION  2006   CARPAL TUNNEL RELEASE  2000   "right" (01/01/2013)   CESAREAN SECTION  1978   COLONOSCOPY     ESOPHAGOGASTRODUODENOSCOPY     I & D KNEE WITH POLY EXCHANGE Left 09/15/2018   Procedure: POLY EXCHANGE LEFT KNEE,/,PATELLAR COMPONENT REVISION  STEROID INJECTION LEFT HIP;  Surgeon: Kathryne Hitch, MD;  Location: [facility];  Service: Orthopedics;  Laterality: Left;   KIDNEY DONATION  1994   KNEE ARTHROPLASTY  01/01/2013   Procedure: COMPUTER ASSISTED TOTAL KNEE ARTHROPLASTY;  Surgeon: Kerrin Champagne, MD;  [facility];  Service: Orthopedics;  Laterality: Left;  Left computer assisted total knee replacement   LIPOMA EXCISION  2000   back   REPLACEMENT TOTAL KNEE  01/01/2013   "left" (01/01/2013)   TOTAL HIP ARTHROPLASTY Right 07/22/2015   Procedure: RIGHT TOTAL HIP ARTHROPLASTY ANTERIOR APPROACH;  Surgeon: Kathryne Hitch, MD;  [facility];  Service: Orthopedics;   Laterality: Right;   TOTAL KNEE ARTHROPLASTY Right 09/02/2023   Procedure: RIGHT TOTAL KNEE ARTHROPLASTY;  Surgeon: Kathryne Hitch, MD;  Location: [facility];  Service: Orthopedics;  Laterality: Right;   Patient Active Problem List   Diagnosis Date Noted   OSA on CPAP 09/27/2023   Hypertension 09/26/2023   Acute pain of right knee 09/26/2023   Angio-edema 09/25/2023   S/P total knee replacement 09/03/2023   Status post total right knee replacement 09/02/2023   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   Status post revision of total replacement of left knee 09/15/2018   Trochanteric bursitis, left hip 08/10/2017   Osteoarthritis of right hip 07/22/2015   Status post total replacement of right hip 07/22/2015   Acute blood loss anemia 01/04/2013    PCP: Caffie Damme, MD  REFERRING PROVIDER: Doneen Poisson, MD  REFERRING DIAG: s/p R TKA  THERAPY DIAG:  Difficulty in walking, not elsewhere classified  Stiffness of right knee, not elsewhere classified  Decreased functional activity tolerance  Rationale for Evaluation and Treatment: Rehabilitation  ONSET DATE: 09/03/23  SUBJECTIVE:   SUBJECTIVE STATEMENT: Feeling good, have allergy testing this week  PERTINENT HISTORY: Underwent elective R TKA 09/02/23.  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  Home 09/27/23.  Completed home health  Patient Details  Name: Diana Bowers MRN: [id number] Date of Birth: [birth year] Referring Provider:  Kathryne Hitch*  Encounter Date: 10/03/2023

## 2023-10-05 ENCOUNTER — Other Ambulatory Visit: Payer: Self-pay

## 2023-10-05 ENCOUNTER — Ambulatory Visit: Payer: Medicare Other

## 2023-10-05 DIAGNOSIS — R6889 Other general symptoms and signs: Secondary | ICD-10-CM

## 2023-10-05 DIAGNOSIS — R262 Difficulty in walking, not elsewhere classified: Secondary | ICD-10-CM | POA: Diagnosis not present

## 2023-10-05 DIAGNOSIS — M25661 Stiffness of right knee, not elsewhere classified: Secondary | ICD-10-CM

## 2023-10-05 NOTE — Therapy (Signed)
Tiyanna Larcom Frazier Richards,  PT 10/05/2023, 12:00 PM [facility]. [place], [place], [address] Phone: [phone]   Fax:  [phone]  Patient Details  Name: Diana Bowers MRN: [id number] Date of Birth: 12/13/1951 Referring Provider:  Kathryne Hitch*  Encounter Date: 10/05/2023  OUTPATIENT PHYSICAL THERAPY LOWER EXTREMITY TREATMENT   Patient Name: Diana Bowers MRN: [id number] DOB:[birth year], 72 y.o., female Today's Date: 10/05/2023  END OF SESSION:  PT End of Session - 10/05/23 1022     Visit Number 4    Date for PT Re-Evaluation 11/23/23    Progress Note Due on Visit 10               Past Medical History:  Diagnosis Date   Anemia    Arthritis    "left knee" (01/01/2013)   Diabetes mellitus without complication (HCC)    borderline no med   DVT of lower extremity (deep venous thrombosis) (HCC) 12/27/2005   "LLE; from birth control pill" (01/01/2013)   GERD (gastroesophageal reflux disease)    Headache    pt. states migraines at times   Hypercholesteremia    Crestor   Hypertension    Past Surgical History:  Procedure Laterality Date   ANTERIOR CERVICAL DECOMP/DISCECTOMY FUSION  2006   CARPAL TUNNEL RELEASE  2000   "right" (01/01/2013)   CESAREAN SECTION  1978   COLONOSCOPY     ESOPHAGOGASTRODUODENOSCOPY     I & D KNEE WITH POLY EXCHANGE Left 09/15/2018   Procedure: POLY EXCHANGE LEFT KNEE,/,PATELLAR COMPONENT REVISION  STEROID INJECTION LEFT HIP;  Surgeon: Kathryne Hitch, MD;  [facility]: [facility];  Service: Orthopedics;  Laterality: Left;   KIDNEY DONATION  1994   KNEE ARTHROPLASTY  01/01/2013   Procedure: COMPUTER ASSISTED TOTAL KNEE ARTHROPLASTY;  Surgeon: Kerrin Champagne, MD;  [facility]: [facility];  Service: Orthopedics;  Laterality: Left;  Left computer assisted total knee replacement   LIPOMA EXCISION  2000   back   REPLACEMENT TOTAL KNEE  01/01/2013   "left" (01/01/2013)   TOTAL HIP ARTHROPLASTY Right 07/22/2015   Procedure: RIGHT TOTAL HIP ARTHROPLASTY ANTERIOR APPROACH;  Surgeon: Kathryne Hitch, MD;  [facility]: [facility];  Service: Orthopedics;  Laterality: Right;   TOTAL KNEE ARTHROPLASTY Right 09/02/2023   Procedure: RIGHT TOTAL KNEE ARTHROPLASTY;  Surgeon: Kathryne Hitch, MD;  Location: [facility];  Service: Orthopedics;  Laterality:  Right;   Patient Active Problem List   Diagnosis Date Noted   OSA on CPAP 09/27/2023   Hypertension 09/26/2023   Acute pain of right knee 09/26/2023   Angio-edema 09/25/2023   S/P total knee replacement 09/03/2023   Status post total right knee replacement 09/02/2023   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   Status post revision of total replacement of left knee 09/15/2018   Trochanteric bursitis, left hip 08/10/2017   Osteoarthritis of right hip 07/22/2015   Status post total replacement of right hip 07/22/2015   Acute blood loss anemia 01/04/2013    PCP: Caffie Damme, MD  REFERRING PROVIDER: Doneen Poisson, MD  REFERRING DIAG: s/p R TKA  THERAPY DIAG:  Difficulty in walking, not elsewhere classified  Stiffness of right knee, not elsewhere classified  Decreased functional activity tolerance  Rationale for Evaluation and Treatment: Rehabilitation  ONSET DATE: 09/03/23  SUBJECTIVE:   SUBJECTIVE STATEMENT: Felt really sore after last rx,  trouble sleeping.  Still sore R knee PERTINENT HISTORY: Underwent elective R TKA 09/02/23.  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  Home 09/27/23.  Completed home health PT prior to her allergic reaction. PAIN:  Are you having pain? Yes: NPRS scale: 0-5/10 Pain location: R knee Pain description: aches, Aggravating factors: movement Relieving factors: ice, meds  PRECAUTIONS: None  RED  OUTPATIENT PHYSICAL THERAPY LOWER EXTREMITY TREATMENT   Patient Name: Diana Bowers MRN: [id number] DOB:[birth year], 72 y.o., female Today's Date: 10/05/2023  END OF SESSION:  PT End of Session - 10/05/23 1022     Visit Number 4    Date for PT Re-Evaluation 11/23/23    Progress Note Due on Visit 10               Past Medical History:  Diagnosis Date   Anemia    Arthritis    "left knee" (01/01/2013)   Diabetes mellitus without complication (HCC)    borderline no med   DVT of lower extremity (deep venous thrombosis) (HCC) 12/27/2005   "LLE; from birth control pill" (01/01/2013)   GERD (gastroesophageal reflux disease)    Headache    pt. states migraines at times   Hypercholesteremia    Crestor   Hypertension    Past Surgical History:  Procedure Laterality Date   ANTERIOR CERVICAL DECOMP/DISCECTOMY FUSION  2006   CARPAL TUNNEL RELEASE  2000   "right" (01/01/2013)   CESAREAN SECTION  1978   COLONOSCOPY     ESOPHAGOGASTRODUODENOSCOPY     I & D KNEE WITH POLY EXCHANGE Left 09/15/2018   Procedure: POLY EXCHANGE LEFT KNEE,/,PATELLAR COMPONENT REVISION  STEROID INJECTION LEFT HIP;  Surgeon: Kathryne Hitch, MD;  Location: [facility];  Service: Orthopedics;  Laterality: Left;   KIDNEY DONATION  1994   KNEE ARTHROPLASTY  01/01/2013   Procedure: COMPUTER ASSISTED TOTAL KNEE ARTHROPLASTY;  Surgeon: Kerrin Champagne, MD;  [facility]: [facility];  Service: Orthopedics;  Laterality: Left;  Left computer assisted total knee replacement   LIPOMA EXCISION  2000   back   REPLACEMENT TOTAL KNEE  01/01/2013   "left" (01/01/2013)   TOTAL HIP ARTHROPLASTY Right 07/22/2015   Procedure: RIGHT TOTAL HIP ARTHROPLASTY ANTERIOR APPROACH;  Surgeon: Kathryne Hitch, MD;  [facility]: [facility];  Service: Orthopedics;  Laterality: Right;   TOTAL KNEE ARTHROPLASTY Right 09/02/2023   Procedure: RIGHT TOTAL KNEE ARTHROPLASTY;  Surgeon: Kathryne Hitch, MD;  Location: [facility];  Service: Orthopedics;  Laterality:  Right;   Patient Active Problem List   Diagnosis Date Noted   OSA on CPAP 09/27/2023   Hypertension 09/26/2023   Acute pain of right knee 09/26/2023   Angio-edema 09/25/2023   S/P total knee replacement 09/03/2023   Status post total right knee replacement 09/02/2023   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   Status post revision of total replacement of left knee 09/15/2018   Trochanteric bursitis, left hip 08/10/2017   Osteoarthritis of right hip 07/22/2015   Status post total replacement of right hip 07/22/2015   Acute blood loss anemia 01/04/2013    PCP: Caffie Damme, MD  REFERRING PROVIDER: Doneen Poisson, MD  REFERRING DIAG: s/p R TKA  THERAPY DIAG:  Difficulty in walking, not elsewhere classified  Stiffness of right knee, not elsewhere classified  Decreased functional activity tolerance  Rationale for Evaluation and Treatment: Rehabilitation  ONSET DATE: 09/03/23  SUBJECTIVE:   SUBJECTIVE STATEMENT: Felt really sore after last rx,  trouble sleeping.  Still sore R knee PERTINENT HISTORY: Underwent elective R TKA 09/02/23.  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  Home 09/27/23.  Completed home health PT prior to her allergic reaction. PAIN:  Are you having pain? Yes: NPRS scale: 0-5/10 Pain location: R knee Pain description: aches, Aggravating factors: movement Relieving factors: ice, meds  PRECAUTIONS: None  RED  OUTPATIENT PHYSICAL THERAPY LOWER EXTREMITY TREATMENT   Patient Name: Diana Bowers MRN: [id number] DOB:[birth year], 72 y.o., female Today's Date: 10/05/2023  END OF SESSION:  PT End of Session - 10/05/23 1022     Visit Number 4    Date for PT Re-Evaluation 11/23/23    Progress Note Due on Visit 10               Past Medical History:  Diagnosis Date   Anemia    Arthritis    "left knee" (01/01/2013)   Diabetes mellitus without complication (HCC)    borderline no med   DVT of lower extremity (deep venous thrombosis) (HCC) 12/27/2005   "LLE; from birth control pill" (01/01/2013)   GERD (gastroesophageal reflux disease)    Headache    pt. states migraines at times   Hypercholesteremia    Crestor   Hypertension    Past Surgical History:  Procedure Laterality Date   ANTERIOR CERVICAL DECOMP/DISCECTOMY FUSION  2006   CARPAL TUNNEL RELEASE  2000   "right" (01/01/2013)   CESAREAN SECTION  1978   COLONOSCOPY     ESOPHAGOGASTRODUODENOSCOPY     I & D KNEE WITH POLY EXCHANGE Left 09/15/2018   Procedure: POLY EXCHANGE LEFT KNEE,/,PATELLAR COMPONENT REVISION  STEROID INJECTION LEFT HIP;  Surgeon: Kathryne Hitch, MD;  Location: [facility];  Service: Orthopedics;  Laterality: Left;   KIDNEY DONATION  1994   KNEE ARTHROPLASTY  01/01/2013   Procedure: COMPUTER ASSISTED TOTAL KNEE ARTHROPLASTY;  Surgeon: Kerrin Champagne, MD;  [facility]: [facility];  Service: Orthopedics;  Laterality: Left;  Left computer assisted total knee replacement   LIPOMA EXCISION  2000   back   REPLACEMENT TOTAL KNEE  01/01/2013   "left" (01/01/2013)   TOTAL HIP ARTHROPLASTY Right 07/22/2015   Procedure: RIGHT TOTAL HIP ARTHROPLASTY ANTERIOR APPROACH;  Surgeon: Kathryne Hitch, MD;  Location: [facility];  Service: Orthopedics;  Laterality: Right;   TOTAL KNEE ARTHROPLASTY Right 09/02/2023   Procedure: RIGHT TOTAL KNEE ARTHROPLASTY;  Surgeon: Kathryne Hitch, MD;  Location: [facility];  Service: Orthopedics;  Laterality:  Right;   Patient Active Problem List   Diagnosis Date Noted   OSA on CPAP 09/27/2023   Hypertension 09/26/2023   Acute pain of right knee 09/26/2023   Angio-edema 09/25/2023   S/P total knee replacement 09/03/2023   Status post total right knee replacement 09/02/2023   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   Status post revision of total replacement of left knee 09/15/2018   Trochanteric bursitis, left hip 08/10/2017   Osteoarthritis of right hip 07/22/2015   Status post total replacement of right hip 07/22/2015   Acute blood loss anemia 01/04/2013    PCP: Caffie Damme, MD  REFERRING PROVIDER: Doneen Poisson, MD  REFERRING DIAG: s/p R TKA  THERAPY DIAG:  Difficulty in walking, not elsewhere classified  Stiffness of right knee, not elsewhere classified  Decreased functional activity tolerance  Rationale for Evaluation and Treatment: Rehabilitation  ONSET DATE: 09/03/23  SUBJECTIVE:   SUBJECTIVE STATEMENT: Felt really sore after last rx,  trouble sleeping.  Still sore R knee PERTINENT HISTORY: Underwent elective R TKA 09/02/23.  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  Home 09/27/23.  Completed home health PT prior to her allergic reaction. PAIN:  Are you having pain? Yes: NPRS scale: 0-5/10 Pain location: R knee Pain description: aches, Aggravating factors: movement Relieving factors: ice, meds  PRECAUTIONS: None  RED

## 2023-10-06 ENCOUNTER — Other Ambulatory Visit: Payer: Self-pay | Admitting: Orthopaedic Surgery

## 2023-10-06 ENCOUNTER — Telehealth: Payer: Self-pay | Admitting: Orthopaedic Surgery

## 2023-10-06 MED ORDER — TRAMADOL HCL 50 MG PO TABS: 50.0000 mg | ORAL_TABLET | Freq: Three times a day (TID) | ORAL | 0 refills | Status: DC | PRN

## 2023-10-06 NOTE — Telephone Encounter (Signed)
Patient called and need a refill of Tramadol. CB#[phone]

## 2023-10-10 ENCOUNTER — Encounter: Payer: Self-pay | Admitting: Physical Therapy

## 2023-10-10 ENCOUNTER — Ambulatory Visit: Payer: Medicare Other | Admitting: Physical Therapy

## 2023-10-10 DIAGNOSIS — R262 Difficulty in walking, not elsewhere classified: Secondary | ICD-10-CM | POA: Diagnosis not present

## 2023-10-10 DIAGNOSIS — R6889 Other general symptoms and signs: Secondary | ICD-10-CM

## 2023-10-10 DIAGNOSIS — M25661 Stiffness of right knee, not elsewhere classified: Secondary | ICD-10-CM

## 2023-10-10 NOTE — Therapy (Signed)
OUTPATIENT PHYSICAL THERAPY LOWER EXTREMITY TREATMENT   Patient Name: Diana Bowers MRN: [id number] DOB:May 14, 1951, 72 y.o., female Today's Date: 10/10/2023  END OF SESSION:  PT End of Session - 10/10/23 1118     Visit Number 5    Date for PT Re-Evaluation 11/23/23    PT Start Time 1053    PT Stop Time 1135    PT Time Calculation (min) 42 min    Activity Tolerance Patient tolerated treatment well    Behavior During Therapy [facility] for tasks assessed/performed            Past Medical History:  Diagnosis Date   Anemia    Arthritis    "left knee" (01/01/2013)   Diabetes mellitus without complication (HCC)    borderline no med   DVT of lower extremity (deep venous thrombosis) (HCC) 12/27/2005   "LLE; from birth control pill" (01/01/2013)   GERD (gastroesophageal reflux disease)    Headache    pt. states migraines at times   Hypercholesteremia    Crestor   Hypertension    Past Surgical History:  Procedure Laterality Date   ANTERIOR CERVICAL DECOMP/DISCECTOMY FUSION  2006   CARPAL TUNNEL RELEASE  2000   "right" (01/01/2013)   CESAREAN SECTION  1978   COLONOSCOPY     ESOPHAGOGASTRODUODENOSCOPY     I & D KNEE WITH POLY EXCHANGE Left 09/15/2018   Procedure: POLY EXCHANGE LEFT KNEE,/,PATELLAR COMPONENT REVISION  STEROID INJECTION LEFT HIP;  Surgeon: Kathryne Hitch, MD;  Location: [facility];  Service: Orthopedics;  Laterality: Left;   KIDNEY DONATION  1994   KNEE ARTHROPLASTY  01/01/2013   Procedure: COMPUTER ASSISTED TOTAL KNEE ARTHROPLASTY;  Surgeon: Kerrin Champagne, MD;  [facility];  Service: Orthopedics;  Laterality: Left;  Left computer assisted total knee replacement   LIPOMA EXCISION  2000   back   REPLACEMENT TOTAL KNEE  01/01/2013   "left" (01/01/2013)   TOTAL HIP ARTHROPLASTY Right 07/22/2015   Procedure: RIGHT TOTAL HIP ARTHROPLASTY ANTERIOR APPROACH;  Surgeon: Kathryne Hitch, MD;  [facility];  Service: Orthopedics;  Laterality: Right;   TOTAL KNEE  ARTHROPLASTY Right 09/02/2023   Procedure: RIGHT TOTAL KNEE ARTHROPLASTY;  Surgeon: Kathryne Hitch, MD;  [facility];  Service: Orthopedics;  Laterality: Right;   Patient Active Problem List   Diagnosis Date Noted   OSA on CPAP 09/27/2023   Hypertension 09/26/2023   Acute pain of right knee 09/26/2023   Angio-edema 09/25/2023   S/P total knee replacement 09/03/2023   Status post total right knee replacement 09/02/2023   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   Status post revision of total replacement of left knee 09/15/2018   Trochanteric bursitis, left hip 08/10/2017   Osteoarthritis of right hip 07/22/2015   Status post total replacement of right hip 07/22/2015   Acute blood loss anemia 01/04/2013    PCP: Caffie Damme, MD  REFERRING PROVIDER: Doneen Poisson, MD  REFERRING DIAG: s/p R TKA  THERAPY DIAG:  Difficulty in walking, not elsewhere classified  Stiffness of right knee, not elsewhere classified  Decreased functional activity tolerance  Rationale for Evaluation and Treatment: Rehabilitation  ONSET DATE: 09/03/23  SUBJECTIVE:   SUBJECTIVE STATEMENT: Patient reports she may have done too much over the weekend. The knee is very stiff and sore.  PERTINENT HISTORY: Underwent elective R TKA 09/02/23.  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  Home 09/27/23.  Completed home health  PT prior to her allergic reaction. PAIN:  Are you having pain? Yes: NPRS scale: 0-5/10 Pain location: R knee Pain description: aches, Aggravating factors: movement Relieving factors: ice, meds  PRECAUTIONS: None  RED FLAGS: None   WEIGHT BEARING RESTRICTIONS: No  FALLS:  Has patient fallen in last 6 months? No  LIVING ENVIRONMENT: Lives with: lives with their spouse Lives in: House/apartment Stairs: Yes: External: 2 steps; on right going up Has following equipment at home: None  OCCUPATION:  retired  PLOF: Independent  PATIENT GOALS: [facility], want to go back to walking and exercises, water aerobics  NEXT MD VISIT: 09/13/23  OBJECTIVE:  Note: Objective measures were completed at Evaluation unless otherwise noted.  DIAGNOSTIC FINDINGS: na  PATIENT SURVEYS:  KOOS Jr: 15/28  COGNITION: Overall cognitive status: Within functional limits for tasks assessed     SENSATION: WFL  EDEMA:  Mod R knee, medially  POSTURE: flexed B hips, maintains extended R knee in standing, steristrips R ant knee, scar forming normally  PALPATION: Tender R patella  LOWER EXTREMITY ROM:  AAROM Right eval Left eval  Hip flexion    Hip extension    Hip abduction    Hip adduction    Hip internal rotation    Hip external rotation    Knee flexion 90   Knee extension 0   Ankle dorsiflexion    Ankle plantarflexion    Ankle inversion    Ankle eversion     (Blank rows = wfl)  LOWER EXTREMITY MMT: seated R LAQ 12 degree lag R SLR with 31 degree lag  MMT R quads 3- R hams 4- R hip flexion 3+   FUNCTIONAL TESTS:  Timed up and go (TUG): 24 6 minute walk test: 2 min 30 sec  GAIT: Distance walked: 180 Assistive device utilized: Walker - 2 wheeled Level of assistance: Modified independence Comments: decreased L hip extension noted. R knee with excellent extension in stance, tends to lock B knees into extension, shortened step length B   TODAY'S TREATMENT:                                                                                                                              DATE:  10/10/23 Bike L1 partial circles x 2 minutes Supine TKR exercises- HS, Knee press, SAQ, x 10 each  Supine SLR, minimal lag noted. Difficult, but performed 2 x 5. Seated LAQ x 15 Knee PROM 0-107 Standing TKE against ball on wall, 10 x 5 sec holds. VASO x 10 min med pressure for inflammation and pain relief, leg elevated.   10/05/23:  Nustep level 5, x 6 min  NMES:  utilized with  [place] e-stim , 4 sec holds, 12 sec rest, 4 pads, quads, combined with short arc quads, 8 min total One set SLR 10 reps, still lag noted but able to perform I Vasopneumatic, utilized with R LE elevated on wedge, x 10  min, 34 degrees, to decrease inflammation post ex soreness.  10/03/23:  Nustep level 5,LE's only x 6 min  Measured R knee flex 103, ext with 10 degree lag Long arc quads, terminal knee extension assisted by PT, with eccentric lowering Seated hamstring curls green band,  2 x15 NMES:  utilized with [place] e-stim , 4 sec holds, 12 sec rest, 4 pads, quads, combined with quad sets, 1/2 foam roll under knee for tactile cues 3 min , then 3 min with assisted SLR Standing for heel toe rocks to cue quads and challenge balance, required B UE suppport Lat heel taps, R LE on 2 " block, B UE support, to fatigue   09/30/23 Nustep L 4 LE only PROM flex and ext AROM after 0-103 Green tband HS curl 2 sets 10 3# LAQ 2 sets 10 Step tap fwd and laterally 6 inch 10 x each Step up 4inch fwd and laterally 10 x each  09/28/23: Eval, then therex:  Nustep level 3, UE and LE 5 min Seated for long arc quads with theraband assist for terminal knee ext Standing for R SLR Standing for heel /toe rocks Gait x 3 laps with r walker, for 2 min, 30 sec    PATIENT EDUCATION:  Education details: POC, goals Person educated: Patient Education method: Programmer, multimedia, Demonstration, Actor cues, Verbal cues, and Handouts Education comprehension: verbalized understanding, returned demonstration, verbal cues required, and tactile cues required  HOME EXERCISE PROGRAM: Access Code: [email or web address] URL: [email or web address] Date: 09/28/2023 Prepared by: Amy Speaks  Exercises - Standing Hip Flexion with Counter Support  - 1 x daily - 7 x weekly - 3 sets - 10 reps - Heel Toe Raises with Unilateral Counter Support  - 1 x daily - 7 x weekly - 3 sets - 10 reps - Seated Long Arc Quad with Strap  - 1 x  daily - 7 x weekly - 3 sets - 10 reps+  ASSESSMENT:  CLINICAL IMPRESSION: Patient reports increased stiffness and pain after she may have done too much cleaning over the weekend. She tolerated TKR exercises well, demonstrated full SAQ, 10 SLR, with decreased extensor lag noted, but still very challenging. Updated HEP with additional strengthening exercises, emphasizing quad. Educated her to modify her expectations for standing and cleaning until she progresses further with her therapy.  OBJECTIVE IMPAIRMENTS: decreased activity tolerance, decreased endurance, decreased mobility, difficulty walking, decreased ROM, decreased strength, increased edema, increased fascial restrictions, impaired perceived functional ability, impaired flexibility, and pain.   ACTIVITY LIMITATIONS: carrying, lifting, standing, squatting, stairs, transfers, bed mobility, and locomotion level  PARTICIPATION LIMITATIONS: meal prep, cleaning, laundry, driving, shopping, community activity, yard work, and church  PERSONAL FACTORS: Behavior pattern, Fitness, Past/current experiences, Time since onset of injury/illness/exacerbation, and 1-2 comorbidities: recent angioedema due to rxn to mustard, h/o R TKA, h/o L THA  are also affecting patient's functional outcome.   REHAB POTENTIAL: Good  CLINICAL DECISION MAKING: Stable/uncomplicated  EVALUATION COMPLEXITY: Low   GOALS: Goals reviewed with patient? Yes  SHORT TERM GOALS: Target date: 10/12/23 I HEP Baseline: Goal status: from rehab 09/30/23   LONG TERM GOALS: Target date: 11/23/23:   KOOS Jr:  Baseline: 15/28 Goal status: INITIAL  2.  ROM R knee -2 to 115 Baseline: 0 to 90 Goal status: INITIAL  3.  Strength R LE quads, hamstrings 4+/5 with MMT for efficiency with transfers Baseline: 3-/5 Goal status: INITIAL  4.  TUG score 14 sec or less for reduced fall risk Baseline:  Goal status:  INITIAL  5.  6 min walk test, with minimal to no exertion, for  improved ability to perform community ambulation Baseline: walked 2 min 30 sec with r walker then fatigued Goal status: INITIAL   PLAN:  PT FREQUENCY: 2x/week  PT DURATION: 8 weeks  PLANNED INTERVENTIONS: Therapeutic exercises, Therapeutic activity, Neuromuscular re-education, Balance training, Gait training, Patient/Family education, Self Care, and Joint mobilization  PLAN FOR NEXT SESSION: continue with endurance activities, progress with stretching for R knee flexion, progress strengthening R LE , gait, add proprioceptive activities as appropriate, may try NMES for quads recruitment   Iona Beard, PT 10/10/2023, 11:38 AM [place] [facility]. [place], [place], [address] Phone: [phone]   Fax:  [phone]  Patient Details  Name: SHARDAY DIMITROFF MRN: [id number] Date of Birth: 12/25/1951 Referring Provider:  Kathryne Hitch*  Encounter Date: 10/10/2023

## 2023-10-12 ENCOUNTER — Ambulatory Visit: Payer: Medicare Other

## 2023-10-12 ENCOUNTER — Other Ambulatory Visit: Payer: Self-pay

## 2023-10-12 DIAGNOSIS — R262 Difficulty in walking, not elsewhere classified: Secondary | ICD-10-CM | POA: Diagnosis not present

## 2023-10-12 DIAGNOSIS — M25661 Stiffness of right knee, not elsewhere classified: Secondary | ICD-10-CM

## 2023-10-12 DIAGNOSIS — R6889 Other general symptoms and signs: Secondary | ICD-10-CM

## 2023-10-12 NOTE — Therapy (Signed)
OUTPATIENT PHYSICAL THERAPY LOWER EXTREMITY TREATMENT   Patient Name: Diana Bowers MRN: [id number] DOB:Sep 17, 1951, 72 y.o., female Today's Date: 10/12/2023  END OF SESSION:  PT End of Session - 10/12/23 1609     Visit Number 6    Date for PT Re-Evaluation 11/23/23    Progress Note Due on Visit 10    PT Start Time 1600    PT Stop Time 1645    PT Time Calculation (min) 45 min    Activity Tolerance Patient tolerated treatment well    Behavior During Therapy [facility] for tasks assessed/performed             Past Medical History:  Diagnosis Date   Anemia    Arthritis    "left knee" (01/01/2013)   Diabetes mellitus without complication (HCC)    borderline no med   DVT of lower extremity (deep venous thrombosis) (HCC) 12/27/2005   "LLE; from birth control pill" (01/01/2013)   GERD (gastroesophageal reflux disease)    Headache    pt. states migraines at times   Hypercholesteremia    Crestor   Hypertension    Past Surgical History:  Procedure Laterality Date   ANTERIOR CERVICAL DECOMP/DISCECTOMY FUSION  2006   CARPAL TUNNEL RELEASE  2000   "right" (01/01/2013)   CESAREAN SECTION  1978   COLONOSCOPY     ESOPHAGOGASTRODUODENOSCOPY     I & D KNEE WITH POLY EXCHANGE Left 09/15/2018   Procedure: POLY EXCHANGE LEFT KNEE,/,PATELLAR COMPONENT REVISION  STEROID INJECTION LEFT HIP;  Surgeon: Kathryne Hitch, MD;  [facility]: [facility];  Service: Orthopedics;  Laterality: Left;   KIDNEY DONATION  1994   KNEE ARTHROPLASTY  01/01/2013   Procedure: COMPUTER ASSISTED TOTAL KNEE ARTHROPLASTY;  Surgeon: Kerrin Champagne, MD;  [facility];  Service: Orthopedics;  Laterality: Left;  Left computer assisted total knee replacement   LIPOMA EXCISION  2000   back   REPLACEMENT TOTAL KNEE  01/01/2013   "left" (01/01/2013)   TOTAL HIP ARTHROPLASTY Right 07/22/2015   Procedure: RIGHT TOTAL HIP ARTHROPLASTY ANTERIOR APPROACH;  Surgeon: Kathryne Hitch, MD;  [facility];  Service: Orthopedics;   Laterality: Right;   TOTAL KNEE ARTHROPLASTY Right 09/02/2023   Procedure: RIGHT TOTAL KNEE ARTHROPLASTY;  Surgeon: Kathryne Hitch, MD;  [facility]: [facility];  Service: Orthopedics;  Laterality: Right;   Patient Active Problem List   Diagnosis Date Noted   OSA on CPAP 09/27/2023   Hypertension 09/26/2023   Acute pain of right knee 09/26/2023   Angio-edema 09/25/2023   S/P total knee replacement 09/03/2023   Status post total right knee replacement 09/02/2023   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   Status post revision of total replacement of left knee 09/15/2018   Trochanteric bursitis, left hip 08/10/2017   Osteoarthritis of right hip 07/22/2015   Status post total replacement of right hip 07/22/2015   Acute blood loss anemia 01/04/2013    PCP: Caffie Damme, MD  REFERRING PROVIDER: Doneen Poisson, MD  REFERRING DIAG: s/p R TKA  THERAPY DIAG:  Difficulty in walking, not elsewhere classified  Stiffness of right knee, not elsewhere classified  Decreased functional activity tolerance  Rationale for Evaluation and Treatment: Rehabilitation  ONSET DATE: 09/03/23  SUBJECTIVE:   SUBJECTIVE STATEMENT: Patient reports feeling well, saw allergist for testing today.   PERTINENT HISTORY: Underwent elective R TKA 09/02/23.  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  Home 09/27/23.  Completed  COMPLEXITY: Low   GOALS: Goals reviewed with patient? Yes  SHORT TERM GOALS: Target date: 10/12/23 I  HEP Baseline: Goal status: from rehab 09/30/23   LONG TERM GOALS: Target date: 11/23/23:   KOOS Jr:  Baseline: 15/28 Goal status: INITIAL  2.  ROM R knee -2 to 115 Baseline: 0 to 90 Goal status: INITIAL  3.  Strength R LE quads, hamstrings 4+/5 with MMT for efficiency with transfers Baseline: 3-/5 Goal status: INITIAL  4.  TUG score 14 sec or less for reduced fall risk Baseline:  Goal status: INITIAL  5.  6 min walk test, with minimal to no exertion, for improved ability to perform community ambulation Baseline: walked 2 min 30 sec with r walker then fatigued Goal status: INITIAL   PLAN:  PT FREQUENCY: 2x/week  PT DURATION: 8 weeks  PLANNED INTERVENTIONS: Therapeutic exercises, Therapeutic activity, Neuromuscular re-education, Balance training, Gait training, Patient/Family education, Self Care, and Joint mobilization  PLAN FOR NEXT SESSION: continue with endurance activities, progress with stretching for R knee flexion, progress strengthening R LE , gait, add proprioceptive activities as appropriate.  Early Chars, PT 10/12/2023, 5:01 PM [place] [facility]. [place], [place], [address] Phone: [phone]   Fax:  [phone]  Patient Details  Name: LATAMARA MELDER MRN: [id number] Date of Birth: January 12, 1951 Referring Provider:  Kathryne Hitch*  Encounter Date: 10/12/2023  OUTPATIENT PHYSICAL THERAPY LOWER EXTREMITY TREATMENT   Patient Name: Diana Bowers MRN: [id number] DOB:Sep 17, 1951, 72 y.o., female Today's Date: 10/12/2023  END OF SESSION:  PT End of Session - 10/12/23 1609     Visit Number 6    Date for PT Re-Evaluation 11/23/23    Progress Note Due on Visit 10    PT Start Time 1600    PT Stop Time 1645    PT Time Calculation (min) 45 min    Activity Tolerance Patient tolerated treatment well    Behavior During Therapy [facility] for tasks assessed/performed             Past Medical History:  Diagnosis Date   Anemia    Arthritis    "left knee" (01/01/2013)   Diabetes mellitus without complication (HCC)    borderline no med   DVT of lower extremity (deep venous thrombosis) (HCC) 12/27/2005   "LLE; from birth control pill" (01/01/2013)   GERD (gastroesophageal reflux disease)    Headache    pt. states migraines at times   Hypercholesteremia    Crestor   Hypertension    Past Surgical History:  Procedure Laterality Date   ANTERIOR CERVICAL DECOMP/DISCECTOMY FUSION  2006   CARPAL TUNNEL RELEASE  2000   "right" (01/01/2013)   CESAREAN SECTION  1978   COLONOSCOPY     ESOPHAGOGASTRODUODENOSCOPY     I & D KNEE WITH POLY EXCHANGE Left 09/15/2018   Procedure: POLY EXCHANGE LEFT KNEE,/,PATELLAR COMPONENT REVISION  STEROID INJECTION LEFT HIP;  Surgeon: Kathryne Hitch, MD;  [facility];  Service: Orthopedics;  Laterality: Left;   KIDNEY DONATION  1994   KNEE ARTHROPLASTY  01/01/2013   Procedure: COMPUTER ASSISTED TOTAL KNEE ARTHROPLASTY;  Surgeon: Kerrin Champagne, MD;  [facility];  Service: Orthopedics;  Laterality: Left;  Left computer assisted total knee replacement   LIPOMA EXCISION  2000   back   REPLACEMENT TOTAL KNEE  01/01/2013   "left" (01/01/2013)   TOTAL HIP ARTHROPLASTY Right 07/22/2015   Procedure: RIGHT TOTAL HIP ARTHROPLASTY ANTERIOR APPROACH;  Surgeon: Kathryne Hitch, MD;  [facility];  Service: Orthopedics;   Laterality: Right;   TOTAL KNEE ARTHROPLASTY Right 09/02/2023   Procedure: RIGHT TOTAL KNEE ARTHROPLASTY;  Surgeon: Kathryne Hitch, MD;  [facility];  Service: Orthopedics;  Laterality: Right;   Patient Active Problem List   Diagnosis Date Noted   OSA on CPAP 09/27/2023   Hypertension 09/26/2023   Acute pain of right knee 09/26/2023   Angio-edema 09/25/2023   S/P total knee replacement 09/03/2023   Status post total right knee replacement 09/02/2023   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   Status post revision of total replacement of left knee 09/15/2018   Trochanteric bursitis, left hip 08/10/2017   Osteoarthritis of right hip 07/22/2015   Status post total replacement of right hip 07/22/2015   Acute blood loss anemia 01/04/2013    PCP: Caffie Damme, MD  REFERRING PROVIDER: Doneen Poisson, MD  REFERRING DIAG: s/p R TKA  THERAPY DIAG:  Difficulty in walking, not elsewhere classified  Stiffness of right knee, not elsewhere classified  Decreased functional activity tolerance  Rationale for Evaluation and Treatment: Rehabilitation  ONSET DATE: 09/03/23  SUBJECTIVE:   SUBJECTIVE STATEMENT: Patient reports feeling well, saw allergist for testing today.   PERTINENT HISTORY: Underwent elective R TKA 09/02/23.  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  Home 09/27/23.  Completed  COMPLEXITY: Low   GOALS: Goals reviewed with patient? Yes  SHORT TERM GOALS: Target date: 10/12/23 I  HEP Baseline: Goal status: from rehab 09/30/23   LONG TERM GOALS: Target date: 11/23/23:   KOOS Jr:  Baseline: 15/28 Goal status: INITIAL  2.  ROM R knee -2 to 115 Baseline: 0 to 90 Goal status: INITIAL  3.  Strength R LE quads, hamstrings 4+/5 with MMT for efficiency with transfers Baseline: 3-/5 Goal status: INITIAL  4.  TUG score 14 sec or less for reduced fall risk Baseline:  Goal status: INITIAL  5.  6 min walk test, with minimal to no exertion, for improved ability to perform community ambulation Baseline: walked 2 min 30 sec with r walker then fatigued Goal status: INITIAL   PLAN:  PT FREQUENCY: 2x/week  PT DURATION: 8 weeks  PLANNED INTERVENTIONS: Therapeutic exercises, Therapeutic activity, Neuromuscular re-education, Balance training, Gait training, Patient/Family education, Self Care, and Joint mobilization  PLAN FOR NEXT SESSION: continue with endurance activities, progress with stretching for R knee flexion, progress strengthening R LE , gait, add proprioceptive activities as appropriate.  Early Chars, PT 10/12/2023, 5:01 PM [place] [facility] at [facility]. [place], [place], [address] Phone: [phone]   Fax:  [phone]  Patient Details  Name: LATAMARA MELDER MRN: [id number] Date of Birth: January 12, 1951 Referring Provider:  Kathryne Hitch*  Encounter Date: 10/12/2023

## 2023-10-13 ENCOUNTER — Ambulatory Visit: Payer: Medicare Other | Admitting: Orthopaedic Surgery

## 2023-10-13 ENCOUNTER — Encounter: Payer: Self-pay | Admitting: Orthopaedic Surgery

## 2023-10-13 DIAGNOSIS — Z96651 Presence of right artificial knee joint: Secondary | ICD-10-CM

## 2023-10-13 NOTE — Progress Notes (Signed)
The patient comes in now 6 weeks status post a right total knee arthroplasty.  She has been in physical therapy and she was able to flex to 110 degrees yesterday.  She resumes therapy next week.  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  She was in the ICU a few days with a very swollen tongue but fortunately did not need any type of surgical intervention.  She is ambit with a cane.  She did ask for steroid injection over her left hip trochanteric area.  That is flared up in terms of her bursitis as she has been recovering from knee replacement surgery.  Her right knee looks great.  Her incisions healed nicely.  I am pleased thus far with her motion and stability.  Her left hip does hurt over the trochanteric area.  I did place a steroid injection per her request over the left hip trochanteric area.  I will like to see her back in a month to see how she is doing overall with her knee range of motion with the right knee.  I would like an AP and lateral right knee standing at that visit.

## 2023-10-17 ENCOUNTER — Other Ambulatory Visit: Payer: Self-pay

## 2023-10-17 ENCOUNTER — Ambulatory Visit: Payer: Medicare Other

## 2023-10-17 DIAGNOSIS — M25661 Stiffness of right knee, not elsewhere classified: Secondary | ICD-10-CM

## 2023-10-17 DIAGNOSIS — R262 Difficulty in walking, not elsewhere classified: Secondary | ICD-10-CM | POA: Diagnosis not present

## 2023-10-17 DIAGNOSIS — R6889 Other general symptoms and signs: Secondary | ICD-10-CM

## 2023-10-17 NOTE — Therapy (Signed)
OUTPATIENT PHYSICAL THERAPY LOWER EXTREMITY TREATMENT   Patient Name: Diana Bowers MRN: [id number] DOB:1951/07/06, 72 y.o., female Today's Date: 10/17/2023  END OF SESSION:  PT End of Session - 10/17/23 1150     Visit Number 7    Date for PT Re-Evaluation 11/23/23    Progress Note Due on Visit 10    PT Start Time 1045    PT Stop Time 1130    PT Time Calculation (min) 45 min    Activity Tolerance Patient tolerated treatment well    Behavior During Therapy [facility] for tasks assessed/performed              Past Medical History:  Diagnosis Date   Anemia    Arthritis    "left knee" (01/01/2013)   Diabetes mellitus without complication (HCC)    borderline no med   DVT of lower extremity (deep venous thrombosis) (HCC) 12/27/2005   "LLE; from birth control pill" (01/01/2013)   GERD (gastroesophageal reflux disease)    Headache    pt. states migraines at times   Hypercholesteremia    Crestor   Hypertension    Past Surgical History:  Procedure Laterality Date   ANTERIOR CERVICAL DECOMP/DISCECTOMY FUSION  2006   CARPAL TUNNEL RELEASE  2000   "right" (01/01/2013)   CESAREAN SECTION  1978   COLONOSCOPY     ESOPHAGOGASTRODUODENOSCOPY     I & D KNEE WITH POLY EXCHANGE Left 09/15/2018   Procedure: POLY EXCHANGE LEFT KNEE,/,PATELLAR COMPONENT REVISION  STEROID INJECTION LEFT HIP;  Surgeon: Kathryne Hitch, MD;  Location: [facility];  Service: Orthopedics;  Laterality: Left;   KIDNEY DONATION  1994   KNEE ARTHROPLASTY  01/01/2013   Procedure: COMPUTER ASSISTED TOTAL KNEE ARTHROPLASTY;  Surgeon: Kerrin Champagne, MD;  Location: [facility];  Service: Orthopedics;  Laterality: Left;  Left computer assisted total knee replacement   LIPOMA EXCISION  2000   back   REPLACEMENT TOTAL KNEE  01/01/2013   "left" (01/01/2013)   TOTAL HIP ARTHROPLASTY Right 07/22/2015   Procedure: RIGHT TOTAL HIP ARTHROPLASTY ANTERIOR APPROACH;  Surgeon: Kathryne Hitch, MD;  Location: [facility];  Service: Orthopedics;   Laterality: Right;   TOTAL KNEE ARTHROPLASTY Right 09/02/2023   Procedure: RIGHT TOTAL KNEE ARTHROPLASTY;  Surgeon: Kathryne Hitch, MD;  Location: [facility];  Service: Orthopedics;  Laterality: Right;   Patient Active Problem List   Diagnosis Date Noted   OSA on CPAP 09/27/2023   Hypertension 09/26/2023   Acute pain of right knee 09/26/2023   Angio-edema 09/25/2023   S/P total knee replacement 09/03/2023   Status post total right knee replacement 09/02/2023   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   Status post revision of total replacement of left knee 09/15/2018   Trochanteric bursitis, left hip 08/10/2017   Osteoarthritis of right hip 07/22/2015   Status post total replacement of right hip 07/22/2015   Acute blood loss anemia 01/04/2013    PCP: Caffie Damme, MD  REFERRING PROVIDER: Doneen Poisson, MD  REFERRING DIAG: s/p R TKA  THERAPY DIAG:  Difficulty in walking, not elsewhere classified  Stiffness of right knee, not elsewhere classified  Decreased functional activity tolerance  Rationale for Evaluation and Treatment: Rehabilitation  ONSET DATE: 09/03/23  SUBJECTIVE:   SUBJECTIVE STATEMENT: Patient reports feeling well, saw ortho surgeon who was pleased with her progress  PERTINENT HISTORY: Underwent elective R TKA 09/02/23.  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  OUTPATIENT PHYSICAL THERAPY LOWER EXTREMITY TREATMENT   Patient Name: Diana Bowers MRN: [id number] DOB:1951/07/06, 72 y.o., female Today's Date: 10/17/2023  END OF SESSION:  PT End of Session - 10/17/23 1150     Visit Number 7    Date for PT Re-Evaluation 11/23/23    Progress Note Due on Visit 10    PT Start Time 1045    PT Stop Time 1130    PT Time Calculation (min) 45 min    Activity Tolerance Patient tolerated treatment well    Behavior During Therapy [facility] for tasks assessed/performed              Past Medical History:  Diagnosis Date   Anemia    Arthritis    "left knee" (01/01/2013)   Diabetes mellitus without complication (HCC)    borderline no med   DVT of lower extremity (deep venous thrombosis) (HCC) 12/27/2005   "LLE; from birth control pill" (01/01/2013)   GERD (gastroesophageal reflux disease)    Headache    pt. states migraines at times   Hypercholesteremia    Crestor   Hypertension    Past Surgical History:  Procedure Laterality Date   ANTERIOR CERVICAL DECOMP/DISCECTOMY FUSION  2006   CARPAL TUNNEL RELEASE  2000   "right" (01/01/2013)   CESAREAN SECTION  1978   COLONOSCOPY     ESOPHAGOGASTRODUODENOSCOPY     I & D KNEE WITH POLY EXCHANGE Left 09/15/2018   Procedure: POLY EXCHANGE LEFT KNEE,/,PATELLAR COMPONENT REVISION  STEROID INJECTION LEFT HIP;  Surgeon: Kathryne Hitch, MD;  Location: [facility];  Service: Orthopedics;  Laterality: Left;   KIDNEY DONATION  1994   KNEE ARTHROPLASTY  01/01/2013   Procedure: COMPUTER ASSISTED TOTAL KNEE ARTHROPLASTY;  Surgeon: Kerrin Champagne, MD;  Location: [facility];  Service: Orthopedics;  Laterality: Left;  Left computer assisted total knee replacement   LIPOMA EXCISION  2000   back   REPLACEMENT TOTAL KNEE  01/01/2013   "left" (01/01/2013)   TOTAL HIP ARTHROPLASTY Right 07/22/2015   Procedure: RIGHT TOTAL HIP ARTHROPLASTY ANTERIOR APPROACH;  Surgeon: Kathryne Hitch, MD;  Location: [facility];  Service: Orthopedics;   Laterality: Right;   TOTAL KNEE ARTHROPLASTY Right 09/02/2023   Procedure: RIGHT TOTAL KNEE ARTHROPLASTY;  Surgeon: Kathryne Hitch, MD;  Location: [facility];  Service: Orthopedics;  Laterality: Right;   Patient Active Problem List   Diagnosis Date Noted   OSA on CPAP 09/27/2023   Hypertension 09/26/2023   Acute pain of right knee 09/26/2023   Angio-edema 09/25/2023   S/P total knee replacement 09/03/2023   Status post total right knee replacement 09/02/2023   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   Status post revision of total replacement of left knee 09/15/2018   Trochanteric bursitis, left hip 08/10/2017   Osteoarthritis of right hip 07/22/2015   Status post total replacement of right hip 07/22/2015   Acute blood loss anemia 01/04/2013    PCP: Caffie Damme, MD  REFERRING PROVIDER: Doneen Poisson, MD  REFERRING DIAG: s/p R TKA  THERAPY DIAG:  Difficulty in walking, not elsewhere classified  Stiffness of right knee, not elsewhere classified  Decreased functional activity tolerance  Rationale for Evaluation and Treatment: Rehabilitation  ONSET DATE: 09/03/23  SUBJECTIVE:   SUBJECTIVE STATEMENT: Patient reports feeling well, saw ortho surgeon who was pleased with her progress  PERTINENT HISTORY: Underwent elective R TKA 09/02/23.  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  to address her goals, progress to prior function/ independence.  Using st cane for support currently, needs to continue to further address balance and proprioception R LE.   OBJECTIVE IMPAIRMENTS: decreased activity tolerance, decreased endurance, decreased mobility, difficulty walking, decreased ROM, decreased strength, increased edema, increased fascial restrictions, impaired perceived functional ability, impaired flexibility, and pain.    ACTIVITY LIMITATIONS: carrying, lifting, standing, squatting, stairs, transfers, bed mobility, and locomotion level  PARTICIPATION LIMITATIONS: meal prep, cleaning, laundry, driving, shopping, community activity, yard work, and church  PERSONAL FACTORS: Behavior pattern, Fitness, Past/current experiences, Time since onset of injury/illness/exacerbation, and 1-2 comorbidities: recent angioedema due to rxn to mustard, h/o R TKA, h/o L THA  are also affecting patient's functional outcome.   REHAB POTENTIAL: Good  CLINICAL DECISION MAKING: Stable/uncomplicated  EVALUATION COMPLEXITY: Low   GOALS: Goals reviewed with patient? Yes  SHORT TERM GOALS: Target date: 10/12/23 I HEP Baseline: Goal status: from rehab 09/30/23   LONG TERM GOALS: Target date: 11/23/23:   KOOS Jr:  Baseline: 15/28 Goal status: INITIAL  2.  ROM R knee -2 to 115 Baseline: 0 to 90 Goal status: INITIAL  3.  Strength R LE quads, hamstrings 4+/5 with MMT for efficiency with transfers Baseline: 3-/5 Goal status: INITIAL  4.  TUG score 14 sec or less for reduced fall risk Baseline:  Goal status: INITIAL  5.  6 min walk test, with minimal to no exertion, for improved ability to perform community ambulation Baseline: walked 2 min 30 sec with r walker then fatigued Goal status: INITIAL   PLAN:  PT FREQUENCY: 2x/week  PT DURATION: 8 weeks  PLANNED INTERVENTIONS: Therapeutic exercises, Therapeutic activity, Neuromuscular re-education, Balance training, Gait training, Patient/Family education, Self Care, and Joint mobilization  PLAN FOR NEXT SESSION: continue with endurance activities, progress with stretching for R knee flexion, progress strengthening R LE , gait, add proprioceptive activities as appropriate.  Early Chars, PT 10/17/2023, 11:52 AM [place] [facility] [place], [place], [address] Phone: [phone]   Fax:   [phone]  Patient Details  Name: Diana Bowers MRN: [id number] Date of Birth: Aug 11, 1951 Referring Provider:  Kathryne Hitch*  Encounter Date: 10/17/2023  to address her goals, progress to prior function/ independence.  Using st cane for support currently, needs to continue to further address balance and proprioception R LE.   OBJECTIVE IMPAIRMENTS: decreased activity tolerance, decreased endurance, decreased mobility, difficulty walking, decreased ROM, decreased strength, increased edema, increased fascial restrictions, impaired perceived functional ability, impaired flexibility, and pain.    ACTIVITY LIMITATIONS: carrying, lifting, standing, squatting, stairs, transfers, bed mobility, and locomotion level  PARTICIPATION LIMITATIONS: meal prep, cleaning, laundry, driving, shopping, community activity, yard work, and church  PERSONAL FACTORS: Behavior pattern, Fitness, Past/current experiences, Time since onset of injury/illness/exacerbation, and 1-2 comorbidities: recent angioedema due to rxn to mustard, h/o R TKA, h/o L THA  are also affecting patient's functional outcome.   REHAB POTENTIAL: Good  CLINICAL DECISION MAKING: Stable/uncomplicated  EVALUATION COMPLEXITY: Low   GOALS: Goals reviewed with patient? Yes  SHORT TERM GOALS: Target date: 10/12/23 I HEP Baseline: Goal status: from rehab 09/30/23   LONG TERM GOALS: Target date: 11/23/23:   KOOS Jr:  Baseline: 15/28 Goal status: INITIAL  2.  ROM R knee -2 to 115 Baseline: 0 to 90 Goal status: INITIAL  3.  Strength R LE quads, hamstrings 4+/5 with MMT for efficiency with transfers Baseline: 3-/5 Goal status: INITIAL  4.  TUG score 14 sec or less for reduced fall risk Baseline:  Goal status: INITIAL  5.  6 min walk test, with minimal to no exertion, for improved ability to perform community ambulation Baseline: walked 2 min 30 sec with r walker then fatigued Goal status: INITIAL   PLAN:  PT FREQUENCY: 2x/week  PT DURATION: 8 weeks  PLANNED INTERVENTIONS: Therapeutic exercises, Therapeutic activity, Neuromuscular re-education, Balance training, Gait training, Patient/Family education, Self Care, and Joint mobilization  PLAN FOR NEXT SESSION: continue with endurance activities, progress with stretching for R knee flexion, progress strengthening R LE , gait, add proprioceptive activities as appropriate.  Early Chars, PT 10/17/2023, 11:52 AM [place] [facility] [place], [place], [address] Phone: [phone]   Fax:   [phone]  Patient Details  Name: Diana Bowers MRN: [id number] Date of Birth: Aug 11, 1951 Referring Provider:  Kathryne Hitch*  Encounter Date: 10/17/2023

## 2023-10-19 ENCOUNTER — Other Ambulatory Visit: Payer: Self-pay

## 2023-10-19 ENCOUNTER — Ambulatory Visit: Payer: Medicare Other

## 2023-10-19 DIAGNOSIS — M25661 Stiffness of right knee, not elsewhere classified: Secondary | ICD-10-CM

## 2023-10-19 DIAGNOSIS — R262 Difficulty in walking, not elsewhere classified: Secondary | ICD-10-CM

## 2023-10-19 DIAGNOSIS — R6889 Other general symptoms and signs: Secondary | ICD-10-CM

## 2023-10-19 NOTE — Therapy (Signed)
OUTPATIENT PHYSICAL THERAPY LOWER EXTREMITY TREATMENT   Patient Name: Diana Bowers MRN: [id number] DOB:16-Mar-1951, 72 y.o., female Today's Date: 10/19/2023  END OF SESSION:  PT End of Session - 10/19/23 1234     Visit Number 8    Date for PT Re-Evaluation 11/23/23    Progress Note Due on Visit 10    PT Start Time 1100    PT Stop Time 1145    PT Time Calculation (min) 45 min    Activity Tolerance Patient tolerated treatment well    Behavior During Therapy [facility] for tasks assessed/performed               Past Medical History:  Diagnosis Date   Anemia    Arthritis    "left knee" (01/01/2013)   Diabetes mellitus without complication (HCC)    borderline no med   DVT of lower extremity (deep venous thrombosis) (HCC) 12/27/2005   "LLE; from birth control pill" (01/01/2013)   GERD (gastroesophageal reflux disease)    Headache    pt. states migraines at times   Hypercholesteremia    Crestor   Hypertension    Past Surgical History:  Procedure Laterality Date   ANTERIOR CERVICAL DECOMP/DISCECTOMY FUSION  2006   CARPAL TUNNEL RELEASE  2000   "right" (01/01/2013)   CESAREAN SECTION  1978   COLONOSCOPY     ESOPHAGOGASTRODUODENOSCOPY     I & D KNEE WITH POLY EXCHANGE Left 09/15/2018   Procedure: POLY EXCHANGE LEFT KNEE,/,PATELLAR COMPONENT REVISION  STEROID INJECTION LEFT HIP;  Surgeon: Kathryne Hitch, MD;  Location: [facility];  Service: Orthopedics;  Laterality: Left;   KIDNEY DONATION  1994   KNEE ARTHROPLASTY  01/01/2013   Procedure: COMPUTER ASSISTED TOTAL KNEE ARTHROPLASTY;  Surgeon: Kerrin Champagne, MD;  Location: [facility];  Service: Orthopedics;  Laterality: Left;  Left computer assisted total knee replacement   LIPOMA EXCISION  2000   back   REPLACEMENT TOTAL KNEE  01/01/2013   "left" (01/01/2013)   TOTAL HIP ARTHROPLASTY Right 07/22/2015   Procedure: RIGHT TOTAL HIP ARTHROPLASTY ANTERIOR APPROACH;  Surgeon: Kathryne Hitch, MD;  Location: [facility];  Service: Orthopedics;   Laterality: Right;   TOTAL KNEE ARTHROPLASTY Right 09/02/2023   Procedure: RIGHT TOTAL KNEE ARTHROPLASTY;  Surgeon: Kathryne Hitch, MD;  Location: [facility];  Service: Orthopedics;  Laterality: Right;   Patient Active Problem List   Diagnosis Date Noted   OSA on CPAP 09/27/2023   Hypertension 09/26/2023   Acute pain of right knee 09/26/2023   Angio-edema 09/25/2023   S/P total knee replacement 09/03/2023   Status post total right knee replacement 09/02/2023   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   Status post revision of total replacement of left knee 09/15/2018   Trochanteric bursitis, left hip 08/10/2017   Osteoarthritis of right hip 07/22/2015   Status post total replacement of right hip 07/22/2015   Acute blood loss anemia 01/04/2013    PCP: Caffie Damme, MD  REFERRING PROVIDER: Doneen Poisson, MD  REFERRING DIAG: s/p R TKA  THERAPY DIAG:  Difficulty in walking, not elsewhere classified  Stiffness of right knee, not elsewhere classified  Decreased functional activity tolerance  Rationale for Evaluation and Treatment: Rehabilitation  ONSET DATE: 09/03/23  SUBJECTIVE:   SUBJECTIVE STATEMENT: Patient reports feeling well, didn't take pain meds today prior to PT   PERTINENT HISTORY: Underwent elective R TKA 09/02/23.  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  1 x daily - 7 x weekly - 3 sets - 10 reps - Heel Toe Raises with Unilateral Counter Support  - 1 x daily - 7 x weekly - 3 sets - 10 reps - Seated Long Arc Quad with Strap  - 1 x daily - 7 x weekly - 3 sets - 10 reps+  ASSESSMENT:  CLINICAL IMPRESSION: Patient progressing very well now, nearly 7weeks post op from her TKA R.   Gradually increasing her flexibility R knee. R quads continues to improve. Better gait today, less flexion R knee in mid stance.   Today was her first day to attend PT without pain meds.  She is nearing completion of her goals, needs to continue to improve her proprioception/ balance on R LE.  She continues to benefit from skilled PT to address her goals, progress to prior function/ independence.  Using st cane for support currently, needs to continue to further address balance and proprioception R LE.   OBJECTIVE IMPAIRMENTS: decreased activity tolerance, decreased endurance, decreased mobility, difficulty walking, decreased ROM, decreased strength, increased edema, increased fascial restrictions, impaired perceived functional ability, impaired flexibility, and pain.   ACTIVITY LIMITATIONS: carrying, lifting, standing, squatting, stairs, transfers, bed mobility, and locomotion level  PARTICIPATION LIMITATIONS: meal prep, cleaning, laundry, driving, shopping, community activity, yard work, and church  PERSONAL FACTORS: Behavior pattern, Fitness, Past/current experiences, Time since onset of injury/illness/exacerbation, and 1-2 comorbidities: recent angioedema due to rxn to mustard, h/o R TKA, h/o L THA  are also affecting patient's functional outcome.   REHAB POTENTIAL: Good  CLINICAL DECISION MAKING: Stable/uncomplicated  EVALUATION COMPLEXITY: Low   GOALS: Goals reviewed with patient? Yes  SHORT TERM GOALS: Target date: 10/12/23 I HEP Baseline: Goal status: from rehab 09/30/23   LONG TERM GOALS: Target date: 11/23/23:   KOOS Jr:  Baseline: 15/28 Goal status: INITIAL  2.  ROM R knee -2 to 115 Baseline: 0 to 90 Goal status: INITIAL  3.  Strength R LE quads, hamstrings 4+/5 with MMT for efficiency with transfers Baseline: 3-/5 Goal status: INITIAL  4.  TUG score 14 sec or less for reduced fall risk Baseline:  Goal status: INITIAL  5.  6 min walk test, with minimal to no  exertion, for improved ability to perform community ambulation Baseline: walked 2 min 30 sec with r walker then fatigued Goal status: INITIAL   PLAN:  PT FREQUENCY: 2x/week  PT DURATION: 8 weeks  PLANNED INTERVENTIONS: Therapeutic exercises, Therapeutic activity, Neuromuscular re-education, Balance training, Gait training, Patient/Family education, Self Care, and Joint mobilization  PLAN FOR NEXT SESSION: continue with endurance activities, progress with stretching for R knee flexion, progress strengthening R LE , gait, add proprioceptive activities as appropriate.  Early Chars, PT 10/19/2023, 12:36 PM [facility] at [facility] [address] [place], [place], [address] Phone: [phone]   Fax:  [phone]  Patient Details  Name: Diana Bowers MRN: [id number] Date of Birth: 21-May-1951 Referring Provider:  Kathryne Hitch*  Encounter Date: 10/19/2023  1 x daily - 7 x weekly - 3 sets - 10 reps - Heel Toe Raises with Unilateral Counter Support  - 1 x daily - 7 x weekly - 3 sets - 10 reps - Seated Long Arc Quad with Strap  - 1 x daily - 7 x weekly - 3 sets - 10 reps+  ASSESSMENT:  CLINICAL IMPRESSION: Patient progressing very well now, nearly 7weeks post op from her TKA R.   Gradually increasing her flexibility R knee. R quads continues to improve. Better gait today, less flexion R knee in mid stance.   Today was her first day to attend PT without pain meds.  She is nearing completion of her goals, needs to continue to improve her proprioception/ balance on R LE.  She continues to benefit from skilled PT to address her goals, progress to prior function/ independence.  Using st cane for support currently, needs to continue to further address balance and proprioception R LE.   OBJECTIVE IMPAIRMENTS: decreased activity tolerance, decreased endurance, decreased mobility, difficulty walking, decreased ROM, decreased strength, increased edema, increased fascial restrictions, impaired perceived functional ability, impaired flexibility, and pain.   ACTIVITY LIMITATIONS: carrying, lifting, standing, squatting, stairs, transfers, bed mobility, and locomotion level  PARTICIPATION LIMITATIONS: meal prep, cleaning, laundry, driving, shopping, community activity, yard work, and church  PERSONAL FACTORS: Behavior pattern, Fitness, Past/current experiences, Time since onset of injury/illness/exacerbation, and 1-2 comorbidities: recent angioedema due to rxn to mustard, h/o R TKA, h/o L THA  are also affecting patient's functional outcome.   REHAB POTENTIAL: Good  CLINICAL DECISION MAKING: Stable/uncomplicated  EVALUATION COMPLEXITY: Low   GOALS: Goals reviewed with patient? Yes  SHORT TERM GOALS: Target date: 10/12/23 I HEP Baseline: Goal status: from rehab 09/30/23   LONG TERM GOALS: Target date: 11/23/23:   KOOS Jr:  Baseline: 15/28 Goal status: INITIAL  2.  ROM R knee -2 to 115 Baseline: 0 to 90 Goal status: INITIAL  3.  Strength R LE quads, hamstrings 4+/5 with MMT for efficiency with transfers Baseline: 3-/5 Goal status: INITIAL  4.  TUG score 14 sec or less for reduced fall risk Baseline:  Goal status: INITIAL  5.  6 min walk test, with minimal to no  exertion, for improved ability to perform community ambulation Baseline: walked 2 min 30 sec with r walker then fatigued Goal status: INITIAL   PLAN:  PT FREQUENCY: 2x/week  PT DURATION: 8 weeks  PLANNED INTERVENTIONS: Therapeutic exercises, Therapeutic activity, Neuromuscular re-education, Balance training, Gait training, Patient/Family education, Self Care, and Joint mobilization  PLAN FOR NEXT SESSION: continue with endurance activities, progress with stretching for R knee flexion, progress strengthening R LE , gait, add proprioceptive activities as appropriate.  Early Chars, PT 10/19/2023, 12:36 PM [place] [facility] at [facility] [address] [place], [place], [address] Phone: [phone]   Fax:  [phone]  Patient Details  Name: Diana Bowers MRN: [id number] Date of Birth: 21-May-1951 Referring Provider:  Kathryne Hitch*  Encounter Date: 10/19/2023  1 x daily - 7 x weekly - 3 sets - 10 reps - Heel Toe Raises with Unilateral Counter Support  - 1 x daily - 7 x weekly - 3 sets - 10 reps - Seated Long Arc Quad with Strap  - 1 x daily - 7 x weekly - 3 sets - 10 reps+  ASSESSMENT:  CLINICAL IMPRESSION: Patient progressing very well now, nearly 7weeks post op from her TKA R.   Gradually increasing her flexibility R knee. R quads continues to improve. Better gait today, less flexion R knee in mid stance.   Today was her first day to attend PT without pain meds.  She is nearing completion of her goals, needs to continue to improve her proprioception/ balance on R LE.  She continues to benefit from skilled PT to address her goals, progress to prior function/ independence.  Using st cane for support currently, needs to continue to further address balance and proprioception R LE.   OBJECTIVE IMPAIRMENTS: decreased activity tolerance, decreased endurance, decreased mobility, difficulty walking, decreased ROM, decreased strength, increased edema, increased fascial restrictions, impaired perceived functional ability, impaired flexibility, and pain.   ACTIVITY LIMITATIONS: carrying, lifting, standing, squatting, stairs, transfers, bed mobility, and locomotion level  PARTICIPATION LIMITATIONS: meal prep, cleaning, laundry, driving, shopping, community activity, yard work, and church  PERSONAL FACTORS: Behavior pattern, Fitness, Past/current experiences, Time since onset of injury/illness/exacerbation, and 1-2 comorbidities: recent angioedema due to rxn to mustard, h/o R TKA, h/o L THA  are also affecting patient's functional outcome.   REHAB POTENTIAL: Good  CLINICAL DECISION MAKING: Stable/uncomplicated  EVALUATION COMPLEXITY: Low   GOALS: Goals reviewed with patient? Yes  SHORT TERM GOALS: Target date: 10/12/23 I HEP Baseline: Goal status: from rehab 09/30/23   LONG TERM GOALS: Target date: 11/23/23:   KOOS Jr:  Baseline: 15/28 Goal status: INITIAL  2.  ROM R knee -2 to 115 Baseline: 0 to 90 Goal status: INITIAL  3.  Strength R LE quads, hamstrings 4+/5 with MMT for efficiency with transfers Baseline: 3-/5 Goal status: INITIAL  4.  TUG score 14 sec or less for reduced fall risk Baseline:  Goal status: INITIAL  5.  6 min walk test, with minimal to no  exertion, for improved ability to perform community ambulation Baseline: walked 2 min 30 sec with r walker then fatigued Goal status: INITIAL   PLAN:  PT FREQUENCY: 2x/week  PT DURATION: 8 weeks  PLANNED INTERVENTIONS: Therapeutic exercises, Therapeutic activity, Neuromuscular re-education, Balance training, Gait training, Patient/Family education, Self Care, and Joint mobilization  PLAN FOR NEXT SESSION: continue with endurance activities, progress with stretching for R knee flexion, progress strengthening R LE , gait, add proprioceptive activities as appropriate.  Early Chars, PT 10/19/2023, 12:36 PM [place] [facility] [address] [place], [place], [address] Phone: [phone]   Fax:  [phone]  Patient Details  Name: Diana Bowers MRN: [id number] Date of Birth: 21-May-1951 Referring Provider:  Kathryne Hitch*  Encounter Date: 10/19/2023

## 2023-10-24 ENCOUNTER — Other Ambulatory Visit: Payer: Self-pay

## 2023-10-24 ENCOUNTER — Ambulatory Visit: Payer: Medicare Other

## 2023-10-24 DIAGNOSIS — M25661 Stiffness of right knee, not elsewhere classified: Secondary | ICD-10-CM

## 2023-10-24 DIAGNOSIS — R262 Difficulty in walking, not elsewhere classified: Secondary | ICD-10-CM | POA: Diagnosis not present

## 2023-10-24 DIAGNOSIS — R6889 Other general symptoms and signs: Secondary | ICD-10-CM

## 2023-10-24 NOTE — Therapy (Signed)
OUTPATIENT PHYSICAL THERAPY LOWER EXTREMITY TREATMENT   Patient Name: Diana Bowers MRN: [id number] DOB:03-15-1951, 72 y.o., female Today's Date: 10/24/2023  END OF SESSION:  PT End of Session - 10/24/23 1107     Visit Number 9    Date for PT Re-Evaluation 11/23/23    Progress Note Due on Visit 10    PT Start Time 1100    PT Stop Time 1145    PT Time Calculation (min) 45 min    Activity Tolerance Patient tolerated treatment well    Behavior During Therapy [facility] for tasks assessed/performed                Past Medical History:  Diagnosis Date   Anemia    Arthritis    "left knee" (01/01/2013)   Diabetes mellitus without complication (HCC)    borderline no med   DVT of lower extremity (deep venous thrombosis) (HCC) 12/27/2005   "LLE; from birth control pill" (01/01/2013)   GERD (gastroesophageal reflux disease)    Headache    pt. states migraines at times   Hypercholesteremia    Crestor   Hypertension    Past Surgical History:  Procedure Laterality Date   ANTERIOR CERVICAL DECOMP/DISCECTOMY FUSION  2006   CARPAL TUNNEL RELEASE  2000   "right" (01/01/2013)   CESAREAN SECTION  1978   COLONOSCOPY     ESOPHAGOGASTRODUODENOSCOPY     I & D KNEE WITH POLY EXCHANGE Left 09/15/2018   Procedure: POLY EXCHANGE LEFT KNEE,/,PATELLAR COMPONENT REVISION  STEROID INJECTION LEFT HIP;  Surgeon: Kathryne Hitch, MD;  Location: [facility];  Service: Orthopedics;  Laterality: Left;   KIDNEY DONATION  1994   KNEE ARTHROPLASTY  01/01/2013   Procedure: COMPUTER ASSISTED TOTAL KNEE ARTHROPLASTY;  Surgeon: Kerrin Champagne, MD;  [facility]: [facility];  Service: Orthopedics;  Laterality: Left;  Left computer assisted total knee replacement   LIPOMA EXCISION  2000   back   REPLACEMENT TOTAL KNEE  01/01/2013   "left" (01/01/2013)   TOTAL HIP ARTHROPLASTY Right 07/22/2015   Procedure: RIGHT TOTAL HIP ARTHROPLASTY ANTERIOR APPROACH;  Surgeon: Kathryne Hitch, MD;  [facility]: [facility];  Service: Orthopedics;   Laterality: Right;   TOTAL KNEE ARTHROPLASTY Right 09/02/2023   Procedure: RIGHT TOTAL KNEE ARTHROPLASTY;  Surgeon: Kathryne Hitch, MD;  Location: [facility];  Service: Orthopedics;  Laterality: Right;   Patient Active Problem List   Diagnosis Date Noted   OSA on CPAP 09/27/2023   Hypertension 09/26/2023   Acute pain of right knee 09/26/2023   Angio-edema 09/25/2023   S/P total knee replacement 09/03/2023   Status post total right knee replacement 09/02/2023   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   Status post revision of total replacement of left knee 09/15/2018   Trochanteric bursitis, left hip 08/10/2017   Osteoarthritis of right hip 07/22/2015   Status post total replacement of right hip 07/22/2015   Acute blood loss anemia 01/04/2013    PCP: Caffie Damme, MD  REFERRING PROVIDER: Doneen Poisson, MD  REFERRING DIAG: s/p R TKA  THERAPY DIAG:  Difficulty in walking, not elsewhere classified  Stiffness of right knee, not elsewhere classified  Decreased functional activity tolerance  Rationale for Evaluation and Treatment: Rehabilitation  ONSET DATE: 09/03/23  SUBJECTIVE:   SUBJECTIVE STATEMENT: Patient reports feeling well, no new problems.  Was up a lot yesterday at church   PERTINENT HISTORY: Underwent elective R TKA 09/02/23.  Had allergic reaction to mustard 09/25/23 and  OUTPATIENT PHYSICAL THERAPY LOWER EXTREMITY TREATMENT   Patient Name: Diana Bowers MRN: [id number] DOB:03-15-1951, 72 y.o., female Today's Date: 10/24/2023  END OF SESSION:  PT End of Session - 10/24/23 1107     Visit Number 9    Date for PT Re-Evaluation 11/23/23    Progress Note Due on Visit 10    PT Start Time 1100    PT Stop Time 1145    PT Time Calculation (min) 45 min    Activity Tolerance Patient tolerated treatment well    Behavior During Therapy [facility] for tasks assessed/performed                Past Medical History:  Diagnosis Date   Anemia    Arthritis    "left knee" (01/01/2013)   Diabetes mellitus without complication (HCC)    borderline no med   DVT of lower extremity (deep venous thrombosis) (HCC) 12/27/2005   "LLE; from birth control pill" (01/01/2013)   GERD (gastroesophageal reflux disease)    Headache    pt. states migraines at times   Hypercholesteremia    Crestor   Hypertension    Past Surgical History:  Procedure Laterality Date   ANTERIOR CERVICAL DECOMP/DISCECTOMY FUSION  2006   CARPAL TUNNEL RELEASE  2000   "right" (01/01/2013)   CESAREAN SECTION  1978   COLONOSCOPY     ESOPHAGOGASTRODUODENOSCOPY     I & D KNEE WITH POLY EXCHANGE Left 09/15/2018   Procedure: POLY EXCHANGE LEFT KNEE,/,PATELLAR COMPONENT REVISION  STEROID INJECTION LEFT HIP;  Surgeon: Kathryne Hitch, MD;  Location: [facility];  Service: Orthopedics;  Laterality: Left;   KIDNEY DONATION  1994   KNEE ARTHROPLASTY  01/01/2013   Procedure: COMPUTER ASSISTED TOTAL KNEE ARTHROPLASTY;  Surgeon: Kerrin Champagne, MD;  [facility];  Service: Orthopedics;  Laterality: Left;  Left computer assisted total knee replacement   LIPOMA EXCISION  2000   back   REPLACEMENT TOTAL KNEE  01/01/2013   "left" (01/01/2013)   TOTAL HIP ARTHROPLASTY Right 07/22/2015   Procedure: RIGHT TOTAL HIP ARTHROPLASTY ANTERIOR APPROACH;  Surgeon: Kathryne Hitch, MD;  [facility];  Service: Orthopedics;   Laterality: Right;   TOTAL KNEE ARTHROPLASTY Right 09/02/2023   Procedure: RIGHT TOTAL KNEE ARTHROPLASTY;  Surgeon: Kathryne Hitch, MD;  Location: [facility];  Service: Orthopedics;  Laterality: Right;   Patient Active Problem List   Diagnosis Date Noted   OSA on CPAP 09/27/2023   Hypertension 09/26/2023   Acute pain of right knee 09/26/2023   Angio-edema 09/25/2023   S/P total knee replacement 09/03/2023   Status post total right knee replacement 09/02/2023   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   Status post revision of total replacement of left knee 09/15/2018   Trochanteric bursitis, left hip 08/10/2017   Osteoarthritis of right hip 07/22/2015   Status post total replacement of right hip 07/22/2015   Acute blood loss anemia 01/04/2013    PCP: Caffie Damme, MD  REFERRING PROVIDER: Doneen Poisson, MD  REFERRING DIAG: s/p R TKA  THERAPY DIAG:  Difficulty in walking, not elsewhere classified  Stiffness of right knee, not elsewhere classified  Decreased functional activity tolerance  Rationale for Evaluation and Treatment: Rehabilitation  ONSET DATE: 09/03/23  SUBJECTIVE:   SUBJECTIVE STATEMENT: Patient reports feeling well, no new problems.  Was up a lot yesterday at church   PERTINENT HISTORY: Underwent elective R TKA 09/02/23.  Had allergic reaction to mustard 09/25/23 and  OUTPATIENT PHYSICAL THERAPY LOWER EXTREMITY TREATMENT   Patient Name: Diana Bowers MRN: [id number] DOB:03-15-1951, 72 y.o., female Today's Date: 10/24/2023  END OF SESSION:  PT End of Session - 10/24/23 1107     Visit Number 9    Date for PT Re-Evaluation 11/23/23    Progress Note Due on Visit 10    PT Start Time 1100    PT Stop Time 1145    PT Time Calculation (min) 45 min    Activity Tolerance Patient tolerated treatment well    Behavior During Therapy [facility] for tasks assessed/performed                Past Medical History:  Diagnosis Date   Anemia    Arthritis    "left knee" (01/01/2013)   Diabetes mellitus without complication (HCC)    borderline no med   DVT of lower extremity (deep venous thrombosis) (HCC) 12/27/2005   "LLE; from birth control pill" (01/01/2013)   GERD (gastroesophageal reflux disease)    Headache    pt. states migraines at times   Hypercholesteremia    Crestor   Hypertension    Past Surgical History:  Procedure Laterality Date   ANTERIOR CERVICAL DECOMP/DISCECTOMY FUSION  2006   CARPAL TUNNEL RELEASE  2000   "right" (01/01/2013)   CESAREAN SECTION  1978   COLONOSCOPY     ESOPHAGOGASTRODUODENOSCOPY     I & D KNEE WITH POLY EXCHANGE Left 09/15/2018   Procedure: POLY EXCHANGE LEFT KNEE,/,PATELLAR COMPONENT REVISION  STEROID INJECTION LEFT HIP;  Surgeon: Kathryne Hitch, MD;  Location: [facility];  Service: Orthopedics;  Laterality: Left;   KIDNEY DONATION  1994   KNEE ARTHROPLASTY  01/01/2013   Procedure: COMPUTER ASSISTED TOTAL KNEE ARTHROPLASTY;  Surgeon: Kerrin Champagne, MD;  [facility]: [facility];  Service: Orthopedics;  Laterality: Left;  Left computer assisted total knee replacement   LIPOMA EXCISION  2000   back   REPLACEMENT TOTAL KNEE  01/01/2013   "left" (01/01/2013)   TOTAL HIP ARTHROPLASTY Right 07/22/2015   Procedure: RIGHT TOTAL HIP ARTHROPLASTY ANTERIOR APPROACH;  Surgeon: Kathryne Hitch, MD;  [facility]: [facility];  Service: Orthopedics;   Laterality: Right;   TOTAL KNEE ARTHROPLASTY Right 09/02/2023   Procedure: RIGHT TOTAL KNEE ARTHROPLASTY;  Surgeon: Kathryne Hitch, MD;  Location: [facility];  Service: Orthopedics;  Laterality: Right;   Patient Active Problem List   Diagnosis Date Noted   OSA on CPAP 09/27/2023   Hypertension 09/26/2023   Acute pain of right knee 09/26/2023   Angio-edema 09/25/2023   S/P total knee replacement 09/03/2023   Status post total right knee replacement 09/02/2023   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   Status post revision of total replacement of left knee 09/15/2018   Trochanteric bursitis, left hip 08/10/2017   Osteoarthritis of right hip 07/22/2015   Status post total replacement of right hip 07/22/2015   Acute blood loss anemia 01/04/2013    PCP: Caffie Damme, MD  REFERRING PROVIDER: Doneen Poisson, MD  REFERRING DIAG: s/p R TKA  THERAPY DIAG:  Difficulty in walking, not elsewhere classified  Stiffness of right knee, not elsewhere classified  Decreased functional activity tolerance  Rationale for Evaluation and Treatment: Rehabilitation  ONSET DATE: 09/03/23  SUBJECTIVE:   SUBJECTIVE STATEMENT: Patient reports feeling well, no new problems.  Was up a lot yesterday at church   PERTINENT HISTORY: Underwent elective R TKA 09/02/23.  Had allergic reaction to mustard 09/25/23 and  OUTPATIENT PHYSICAL THERAPY LOWER EXTREMITY TREATMENT   Patient Name: Diana Bowers MRN: [id number] DOB:03-15-1951, 72 y.o., female Today's Date: 10/24/2023  END OF SESSION:  PT End of Session - 10/24/23 1107     Visit Number 9    Date for PT Re-Evaluation 11/23/23    Progress Note Due on Visit 10    PT Start Time 1100    PT Stop Time 1145    PT Time Calculation (min) 45 min    Activity Tolerance Patient tolerated treatment well    Behavior During Therapy [facility] for tasks assessed/performed                Past Medical History:  Diagnosis Date   Anemia    Arthritis    "left knee" (01/01/2013)   Diabetes mellitus without complication (HCC)    borderline no med   DVT of lower extremity (deep venous thrombosis) (HCC) 12/27/2005   "LLE; from birth control pill" (01/01/2013)   GERD (gastroesophageal reflux disease)    Headache    pt. states migraines at times   Hypercholesteremia    Crestor   Hypertension    Past Surgical History:  Procedure Laterality Date   ANTERIOR CERVICAL DECOMP/DISCECTOMY FUSION  2006   CARPAL TUNNEL RELEASE  2000   "right" (01/01/2013)   CESAREAN SECTION  1978   COLONOSCOPY     ESOPHAGOGASTRODUODENOSCOPY     I & D KNEE WITH POLY EXCHANGE Left 09/15/2018   Procedure: POLY EXCHANGE LEFT KNEE,/,PATELLAR COMPONENT REVISION  STEROID INJECTION LEFT HIP;  Surgeon: Kathryne Hitch, MD;  Location: [facility];  Service: Orthopedics;  Laterality: Left;   KIDNEY DONATION  1994   KNEE ARTHROPLASTY  01/01/2013   Procedure: COMPUTER ASSISTED TOTAL KNEE ARTHROPLASTY;  Surgeon: Kerrin Champagne, MD;  Location: [facility];  Service: Orthopedics;  Laterality: Left;  Left computer assisted total knee replacement   LIPOMA EXCISION  2000   back   REPLACEMENT TOTAL KNEE  01/01/2013   "left" (01/01/2013)   TOTAL HIP ARTHROPLASTY Right 07/22/2015   Procedure: RIGHT TOTAL HIP ARTHROPLASTY ANTERIOR APPROACH;  Surgeon: Kathryne Hitch, MD;  Location: [facility];  Service: Orthopedics;   Laterality: Right;   TOTAL KNEE ARTHROPLASTY Right 09/02/2023   Procedure: RIGHT TOTAL KNEE ARTHROPLASTY;  Surgeon: Kathryne Hitch, MD;  Location: [facility];  Service: Orthopedics;  Laterality: Right;   Patient Active Problem List   Diagnosis Date Noted   OSA on CPAP 09/27/2023   Hypertension 09/26/2023   Acute pain of right knee 09/26/2023   Angio-edema 09/25/2023   S/P total knee replacement 09/03/2023   Status post total right knee replacement 09/02/2023   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   Status post revision of total replacement of left knee 09/15/2018   Trochanteric bursitis, left hip 08/10/2017   Osteoarthritis of right hip 07/22/2015   Status post total replacement of right hip 07/22/2015   Acute blood loss anemia 01/04/2013    PCP: Caffie Damme, MD  REFERRING PROVIDER: Doneen Poisson, MD  REFERRING DIAG: s/p R TKA  THERAPY DIAG:  Difficulty in walking, not elsewhere classified  Stiffness of right knee, not elsewhere classified  Decreased functional activity tolerance  Rationale for Evaluation and Treatment: Rehabilitation  ONSET DATE: 09/03/23  SUBJECTIVE:   SUBJECTIVE STATEMENT: Patient reports feeling well, no new problems.  Was up a lot yesterday at church   PERTINENT HISTORY: Underwent elective R TKA 09/02/23.  Had allergic reaction to mustard 09/25/23 and  [facility] [address] [facility]. [place], [place], [address] Phone: [phone]   Fax:  [phone]  Patient Details  Name: Diana Bowers MRN: [id number] Date of Birth: 1951/12/07 Referring Provider:  Kathryne Hitch*  Encounter Date: 10/24/2023

## 2023-10-26 ENCOUNTER — Other Ambulatory Visit: Payer: Self-pay

## 2023-10-26 ENCOUNTER — Ambulatory Visit: Payer: Medicare Other

## 2023-10-26 DIAGNOSIS — R262 Difficulty in walking, not elsewhere classified: Secondary | ICD-10-CM

## 2023-10-26 DIAGNOSIS — R6889 Other general symptoms and signs: Secondary | ICD-10-CM

## 2023-10-26 DIAGNOSIS — M25661 Stiffness of right knee, not elsewhere classified: Secondary | ICD-10-CM

## 2023-10-26 NOTE — Therapy (Signed)
OUTPATIENT PHYSICAL THERAPY LOWER EXTREMITY TREATMENT  Progress Note Reporting Period 09/28/23 to 10/26/23  See note below for Objective Data and Assessment of Progress/Goals.     Patient Name: Diana Bowers MRN: [id number] DOB:[birth year], 72 y.o., female Today's Date: 10/26/2023  END OF SESSION:  PT End of Session - 10/26/23 1103     Visit Number 10    Date for PT Re-Evaluation 11/23/23    Progress Note Due on Visit 20    PT Start Time 1056    PT Stop Time 1140    PT Time Calculation (min) 44 min    Activity Tolerance Patient tolerated treatment well    Behavior During Therapy [facility] for tasks assessed/performed                 Past Medical History:  Diagnosis Date   Anemia    Arthritis    "left knee" (01/01/2013)   Diabetes mellitus without complication (HCC)    borderline no med   DVT of lower extremity (deep venous thrombosis) (HCC) 12/27/2005   "LLE; from birth control pill" (01/01/2013)   GERD (gastroesophageal reflux disease)    Headache    pt. states migraines at times   Hypercholesteremia    Crestor   Hypertension    Past Surgical History:  Procedure Laterality Date   ANTERIOR CERVICAL DECOMP/DISCECTOMY FUSION  2006   CARPAL TUNNEL RELEASE  2000   "right" (01/01/2013)   CESAREAN SECTION  1978   COLONOSCOPY     ESOPHAGOGASTRODUODENOSCOPY     I & D KNEE WITH POLY EXCHANGE Left 09/15/2018   Procedure: POLY EXCHANGE LEFT KNEE,/,PATELLAR COMPONENT REVISION  STEROID INJECTION LEFT HIP;  Surgeon: Kathryne Hitch, MD;  Location: [facility];  Service: Orthopedics;  Laterality: Left;   KIDNEY DONATION  1994   KNEE ARTHROPLASTY  01/01/2013   Procedure: COMPUTER ASSISTED TOTAL KNEE ARTHROPLASTY;  Surgeon: Kerrin Champagne, MD;  Location: [facility];  Service: Orthopedics;  Laterality: Left;  Left computer assisted total knee replacement   LIPOMA EXCISION  2000   back   REPLACEMENT TOTAL KNEE  01/01/2013   "left" (01/01/2013)   TOTAL HIP ARTHROPLASTY Right 07/22/2015    Procedure: RIGHT TOTAL HIP ARTHROPLASTY ANTERIOR APPROACH;  Surgeon: Kathryne Hitch, MD;  Location: [facility];  Service: Orthopedics;  Laterality: Right;   TOTAL KNEE ARTHROPLASTY Right 09/02/2023   Procedure: RIGHT TOTAL KNEE ARTHROPLASTY;  Surgeon: Kathryne Hitch, MD;  Location: [facility];  Service: Orthopedics;  Laterality: Right;   Patient Active Problem List   Diagnosis Date Noted   OSA on CPAP 09/27/2023   Hypertension 09/26/2023   Acute pain of right knee 09/26/2023   Angio-edema 09/25/2023   S/P total knee replacement 09/03/2023   Status post total right knee replacement 09/02/2023   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   Status post revision of total replacement of left knee 09/15/2018   Trochanteric bursitis, left hip 08/10/2017   Osteoarthritis of right hip 07/22/2015   Status post total replacement of right hip 07/22/2015   Acute blood loss anemia 01/04/2013    PCP: Caffie Damme, MD  REFERRING PROVIDER: Doneen Poisson, MD  REFERRING DIAG: s/p R TKA  THERAPY DIAG:  Difficulty in walking, not elsewhere classified  Stiffness of right knee, not elsewhere classified  Decreased functional activity tolerance  Rationale for Evaluation and Treatment: Rehabilitation  ONSET DATE: 09/03/23  SUBJECTIVE:   SUBJECTIVE STATEMENT: Patient reports feeling well, no new problems.  OUTPATIENT PHYSICAL THERAPY LOWER EXTREMITY TREATMENT  Progress Note Reporting Period 09/28/23 to 10/26/23  See note below for Objective Data and Assessment of Progress/Goals.     Patient Name: Diana Bowers MRN: [id number] DOB:[birth year], 72 y.o., female Today's Date: 10/26/2023  END OF SESSION:  PT End of Session - 10/26/23 1103     Visit Number 10    Date for PT Re-Evaluation 11/23/23    Progress Note Due on Visit 20    PT Start Time 1056    PT Stop Time 1140    PT Time Calculation (min) 44 min    Activity Tolerance Patient tolerated treatment well    Behavior During Therapy [facility] for tasks assessed/performed                 Past Medical History:  Diagnosis Date   Anemia    Arthritis    "left knee" (01/01/2013)   Diabetes mellitus without complication (HCC)    borderline no med   DVT of lower extremity (deep venous thrombosis) (HCC) 12/27/2005   "LLE; from birth control pill" (01/01/2013)   GERD (gastroesophageal reflux disease)    Headache    pt. states migraines at times   Hypercholesteremia    Crestor   Hypertension    Past Surgical History:  Procedure Laterality Date   ANTERIOR CERVICAL DECOMP/DISCECTOMY FUSION  2006   CARPAL TUNNEL RELEASE  2000   "right" (01/01/2013)   CESAREAN SECTION  1978   COLONOSCOPY     ESOPHAGOGASTRODUODENOSCOPY     I & D KNEE WITH POLY EXCHANGE Left 09/15/2018   Procedure: POLY EXCHANGE LEFT KNEE,/,PATELLAR COMPONENT REVISION  STEROID INJECTION LEFT HIP;  Surgeon: Kathryne Hitch, MD;  Location: [facility];  Service: Orthopedics;  Laterality: Left;   KIDNEY DONATION  1994   KNEE ARTHROPLASTY  01/01/2013   Procedure: COMPUTER ASSISTED TOTAL KNEE ARTHROPLASTY;  Surgeon: Kerrin Champagne, MD;  [facility]: [facility];  Service: Orthopedics;  Laterality: Left;  Left computer assisted total knee replacement   LIPOMA EXCISION  2000   back   REPLACEMENT TOTAL KNEE  01/01/2013   "left" (01/01/2013)   TOTAL HIP ARTHROPLASTY Right 07/22/2015    Procedure: RIGHT TOTAL HIP ARTHROPLASTY ANTERIOR APPROACH;  Surgeon: Kathryne Hitch, MD;  [facility]: [facility];  Service: Orthopedics;  Laterality: Right;   TOTAL KNEE ARTHROPLASTY Right 09/02/2023   Procedure: RIGHT TOTAL KNEE ARTHROPLASTY;  Surgeon: Kathryne Hitch, MD;  Location: [facility];  Service: Orthopedics;  Laterality: Right;   Patient Active Problem List   Diagnosis Date Noted   OSA on CPAP 09/27/2023   Hypertension 09/26/2023   Acute pain of right knee 09/26/2023   Angio-edema 09/25/2023   S/P total knee replacement 09/03/2023   Status post total right knee replacement 09/02/2023   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   Status post revision of total replacement of left knee 09/15/2018   Trochanteric bursitis, left hip 08/10/2017   Osteoarthritis of right hip 07/22/2015   Status post total replacement of right hip 07/22/2015   Acute blood loss anemia 01/04/2013    PCP: Caffie Damme, MD  REFERRING PROVIDER: Doneen Poisson, MD  REFERRING DIAG: s/p R TKA  THERAPY DIAG:  Difficulty in walking, not elsewhere classified  Stiffness of right knee, not elsewhere classified  Decreased functional activity tolerance  Rationale for Evaluation and Treatment: Rehabilitation  ONSET DATE: 09/03/23  SUBJECTIVE:   SUBJECTIVE STATEMENT: Patient reports feeling well, no new problems.  OUTPATIENT PHYSICAL THERAPY LOWER EXTREMITY TREATMENT  Progress Note Reporting Period 09/28/23 to 10/26/23  See note below for Objective Data and Assessment of Progress/Goals.     Patient Name: Diana Bowers MRN: [id number] DOB:[birth year], 72 y.o., female Today's Date: 10/26/2023  END OF SESSION:  PT End of Session - 10/26/23 1103     Visit Number 10    Date for PT Re-Evaluation 11/23/23    Progress Note Due on Visit 20    PT Start Time 1056    PT Stop Time 1140    PT Time Calculation (min) 44 min    Activity Tolerance Patient tolerated treatment well    Behavior During Therapy [facility] for tasks assessed/performed                 Past Medical History:  Diagnosis Date   Anemia    Arthritis    "left knee" (01/01/2013)   Diabetes mellitus without complication (HCC)    borderline no med   DVT of lower extremity (deep venous thrombosis) (HCC) 12/27/2005   "LLE; from birth control pill" (01/01/2013)   GERD (gastroesophageal reflux disease)    Headache    pt. states migraines at times   Hypercholesteremia    Crestor   Hypertension    Past Surgical History:  Procedure Laterality Date   ANTERIOR CERVICAL DECOMP/DISCECTOMY FUSION  2006   CARPAL TUNNEL RELEASE  2000   "right" (01/01/2013)   CESAREAN SECTION  1978   COLONOSCOPY     ESOPHAGOGASTRODUODENOSCOPY     I & D KNEE WITH POLY EXCHANGE Left 09/15/2018   Procedure: POLY EXCHANGE LEFT KNEE,/,PATELLAR COMPONENT REVISION  STEROID INJECTION LEFT HIP;  Surgeon: Kathryne Hitch, MD;  Location: [facility];  Service: Orthopedics;  Laterality: Left;   KIDNEY DONATION  1994   KNEE ARTHROPLASTY  01/01/2013   Procedure: COMPUTER ASSISTED TOTAL KNEE ARTHROPLASTY;  Surgeon: Kerrin Champagne, MD;  [facility]: [facility];  Service: Orthopedics;  Laterality: Left;  Left computer assisted total knee replacement   LIPOMA EXCISION  2000   back   REPLACEMENT TOTAL KNEE  01/01/2013   "left" (01/01/2013)   TOTAL HIP ARTHROPLASTY Right 07/22/2015    Procedure: RIGHT TOTAL HIP ARTHROPLASTY ANTERIOR APPROACH;  Surgeon: Kathryne Hitch, MD;  [facility];  Service: Orthopedics;  Laterality: Right;   TOTAL KNEE ARTHROPLASTY Right 09/02/2023   Procedure: RIGHT TOTAL KNEE ARTHROPLASTY;  Surgeon: Kathryne Hitch, MD;  Location: [facility];  Service: Orthopedics;  Laterality: Right;   Patient Active Problem List   Diagnosis Date Noted   OSA on CPAP 09/27/2023   Hypertension 09/26/2023   Acute pain of right knee 09/26/2023   Angio-edema 09/25/2023   S/P total knee replacement 09/03/2023   Status post total right knee replacement 09/02/2023   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   Status post revision of total replacement of left knee 09/15/2018   Trochanteric bursitis, left hip 08/10/2017   Osteoarthritis of right hip 07/22/2015   Status post total replacement of right hip 07/22/2015   Acute blood loss anemia 01/04/2013    PCP: Caffie Damme, MD  REFERRING PROVIDER: Doneen Poisson, MD  REFERRING DIAG: s/p R TKA  THERAPY DIAG:  Difficulty in walking, not elsewhere classified  Stiffness of right knee, not elsewhere classified  Decreased functional activity tolerance  Rationale for Evaluation and Treatment: Rehabilitation  ONSET DATE: 09/03/23  SUBJECTIVE:   SUBJECTIVE STATEMENT: Patient reports feeling well, no new problems.  OUTPATIENT PHYSICAL THERAPY LOWER EXTREMITY TREATMENT  Progress Note Reporting Period 09/28/23 to 10/26/23  See note below for Objective Data and Assessment of Progress/Goals.     Patient Name: Diana Bowers MRN: [id number] DOB:[birth year], 72 y.o., female Today's Date: 10/26/2023  END OF SESSION:  PT End of Session - 10/26/23 1103     Visit Number 10    Date for PT Re-Evaluation 11/23/23    Progress Note Due on Visit 20    PT Start Time 1056    PT Stop Time 1140    PT Time Calculation (min) 44 min    Activity Tolerance Patient tolerated treatment well    Behavior During Therapy [facility] for tasks assessed/performed                 Past Medical History:  Diagnosis Date   Anemia    Arthritis    "left knee" (01/01/2013)   Diabetes mellitus without complication (HCC)    borderline no med   DVT of lower extremity (deep venous thrombosis) (HCC) 12/27/2005   "LLE; from birth control pill" (01/01/2013)   GERD (gastroesophageal reflux disease)    Headache    pt. states migraines at times   Hypercholesteremia    Crestor   Hypertension    Past Surgical History:  Procedure Laterality Date   ANTERIOR CERVICAL DECOMP/DISCECTOMY FUSION  2006   CARPAL TUNNEL RELEASE  2000   "right" (01/01/2013)   CESAREAN SECTION  1978   COLONOSCOPY     ESOPHAGOGASTRODUODENOSCOPY     I & D KNEE WITH POLY EXCHANGE Left 09/15/2018   Procedure: POLY EXCHANGE LEFT KNEE,/,PATELLAR COMPONENT REVISION  STEROID INJECTION LEFT HIP;  Surgeon: Kathryne Hitch, MD;  Location: [facility];  Service: Orthopedics;  Laterality: Left;   KIDNEY DONATION  1994   KNEE ARTHROPLASTY  01/01/2013   Procedure: COMPUTER ASSISTED TOTAL KNEE ARTHROPLASTY;  Surgeon: Kerrin Champagne, MD;  [facility]: [facility];  Service: Orthopedics;  Laterality: Left;  Left computer assisted total knee replacement   LIPOMA EXCISION  2000   back   REPLACEMENT TOTAL KNEE  01/01/2013   "left" (01/01/2013)   TOTAL HIP ARTHROPLASTY Right 07/22/2015    Procedure: RIGHT TOTAL HIP ARTHROPLASTY ANTERIOR APPROACH;  Surgeon: Kathryne Hitch, MD;  [facility]: [facility];  Service: Orthopedics;  Laterality: Right;   TOTAL KNEE ARTHROPLASTY Right 09/02/2023   Procedure: RIGHT TOTAL KNEE ARTHROPLASTY;  Surgeon: Kathryne Hitch, MD;  Location: [facility];  Service: Orthopedics;  Laterality: Right;   Patient Active Problem List   Diagnosis Date Noted   OSA on CPAP 09/27/2023   Hypertension 09/26/2023   Acute pain of right knee 09/26/2023   Angio-edema 09/25/2023   S/P total knee replacement 09/03/2023   Status post total right knee replacement 09/02/2023   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   Status post revision of total replacement of left knee 09/15/2018   Trochanteric bursitis, left hip 08/10/2017   Osteoarthritis of right hip 07/22/2015   Status post total replacement of right hip 07/22/2015   Acute blood loss anemia 01/04/2013    PCP: Caffie Damme, MD  REFERRING PROVIDER: Doneen Poisson, MD  REFERRING DIAG: s/p R TKA  THERAPY DIAG:  Difficulty in walking, not elsewhere classified  Stiffness of right knee, not elsewhere classified  Decreased functional activity tolerance  Rationale for Evaluation and Treatment: Rehabilitation  ONSET DATE: 09/03/23  SUBJECTIVE:   SUBJECTIVE STATEMENT: Patient reports feeling well, no new problems.  reduced fall risk Baseline:  Goal status: 10/26/23: met  5.  6 min walk test, with minimal to no exertion, for improved ability to perform community ambulation Baseline: walked 2 min 30 sec with r walker then fatigued Goal status: 10/26/23:    PLAN:  PT FREQUENCY: 2x/week  PT DURATION: 8 weeks  PLANNED INTERVENTIONS: Therapeutic exercises, Therapeutic activity, Neuromuscular re-education, Balance training, Gait training, Patient/Family education, Self Care, and Joint mobilization  PLAN FOR NEXT SESSION: continue with endurance activities, progress with stretching for R knee flexion, progress strengthening R LE , gait, add proprioceptive activities as appropriate.  Early Chars, PT 10/26/2023, 12:51 PM [facility] [address] [facility] [place], [place], [address] Phone: [phone]   Fax:  [phone]  Patient Details  Name: Diana Bowers MRN: [id number] Date of Birth: [birth year] Referring Provider:  Kathryne Hitch*  Encounter Date: 10/26/2023

## 2023-10-27 ENCOUNTER — Other Ambulatory Visit: Payer: Self-pay | Admitting: Orthopaedic Surgery

## 2023-10-27 MED ORDER — TRAMADOL HCL 50 MG PO TABS: 50.0000 mg | ORAL_TABLET | Freq: Three times a day (TID) | ORAL | 0 refills | Status: DC | PRN

## 2023-10-31 ENCOUNTER — Ambulatory Visit: Payer: Medicare Other | Attending: Orthopaedic Surgery

## 2023-10-31 ENCOUNTER — Other Ambulatory Visit: Payer: Self-pay

## 2023-10-31 DIAGNOSIS — R262 Difficulty in walking, not elsewhere classified: Secondary | ICD-10-CM | POA: Insufficient documentation

## 2023-10-31 DIAGNOSIS — R6889 Other general symptoms and signs: Secondary | ICD-10-CM | POA: Diagnosis present

## 2023-10-31 DIAGNOSIS — M25661 Stiffness of right knee, not elsewhere classified: Secondary | ICD-10-CM | POA: Insufficient documentation

## 2023-10-31 NOTE — Therapy (Signed)
OUTPATIENT PHYSICAL THERAPY LOWER EXTREMITY TREATMENT     Patient Name: Diana Bowers MRN: [id number] DOB:[birth year], 72 y.o., female Today's Date: 10/31/2023  END OF SESSION:  PT End of Session - 10/31/23 1638     Visit Number 11    Date for PT Re-Evaluation 11/23/23    PT Start Time 1550    PT Stop Time 1635    PT Time Calculation (min) 45 min    Activity Tolerance Patient tolerated treatment well    Behavior During Therapy [facility] for tasks assessed/performed                  Past Medical History:  Diagnosis Date   Anemia    Arthritis    "left knee" (01/01/2013)   Diabetes mellitus without complication (HCC)    borderline no med   DVT of lower extremity (deep venous thrombosis) (HCC) 12/27/2005   "LLE; from birth control pill" (01/01/2013)   GERD (gastroesophageal reflux disease)    Headache    pt. states migraines at times   Hypercholesteremia    Crestor   Hypertension    Past Surgical History:  Procedure Laterality Date   ANTERIOR CERVICAL DECOMP/DISCECTOMY FUSION  2006   CARPAL TUNNEL RELEASE  2000   "right" (01/01/2013)   CESAREAN SECTION  1978   COLONOSCOPY     ESOPHAGOGASTRODUODENOSCOPY     I & D KNEE WITH POLY EXCHANGE Left 09/15/2018   Procedure: POLY EXCHANGE LEFT KNEE,/,PATELLAR COMPONENT REVISION  STEROID INJECTION LEFT HIP;  Surgeon: Kathryne Hitch, MD;  Location: [facility];  Service: Orthopedics;  Laterality: Left;   KIDNEY DONATION  1994   KNEE ARTHROPLASTY  01/01/2013   Procedure: COMPUTER ASSISTED TOTAL KNEE ARTHROPLASTY;  Surgeon: Kerrin Champagne, MD;  Location: [facility];  Service: Orthopedics;  Laterality: Left;  Left computer assisted total knee replacement   LIPOMA EXCISION  2000   back   REPLACEMENT TOTAL KNEE  01/01/2013   "left" (01/01/2013)   TOTAL HIP ARTHROPLASTY Right 07/22/2015   Procedure: RIGHT TOTAL HIP ARTHROPLASTY ANTERIOR APPROACH;  Surgeon: Kathryne Hitch, MD;  Location: [facility];  Service: Orthopedics;  Laterality: Right;    TOTAL KNEE ARTHROPLASTY Right 09/02/2023   Procedure: RIGHT TOTAL KNEE ARTHROPLASTY;  Surgeon: Kathryne Hitch, MD;  Location: [facility];  Service: Orthopedics;  Laterality: Right;   Patient Active Problem List   Diagnosis Date Noted   OSA on CPAP 09/27/2023   Hypertension 09/26/2023   Acute pain of right knee 09/26/2023   Angio-edema 09/25/2023   S/P total knee replacement 09/03/2023   Status post total right knee replacement 09/02/2023   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   Status post revision of total replacement of left knee 09/15/2018   Trochanteric bursitis, left hip 08/10/2017   Osteoarthritis of right hip 07/22/2015   Status post total replacement of right hip 07/22/2015   Acute blood loss anemia 01/04/2013    PCP: Caffie Damme, MD  REFERRING PROVIDER: Doneen Poisson, MD  REFERRING DIAG: s/p R TKA  THERAPY DIAG:  Difficulty in walking, not elsewhere classified  Stiffness of right knee, not elsewhere classified  Decreased functional activity tolerance  Rationale for Evaluation and Treatment: Rehabilitation  ONSET DATE: 09/03/23  SUBJECTIVE:   SUBJECTIVE STATEMENT: Patient reports feeling well, no new problems. L hip stiff     PERTINENT HISTORY: Underwent elective R TKA 09/02/23.  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  Home 09/27/23.  Completed home health PT prior to her allergic reaction. PAIN:  Are you having pain? Yes: NPRS scale: 0-5/10 Pain location: R knee Pain description: aches, Aggravating factors: movement Relieving factors: ice, meds  PRECAUTIONS: None  RED FLAGS: None   WEIGHT BEARING RESTRICTIONS: No  FALLS:  Has patient fallen in last 6 months? No  LIVING ENVIRONMENT: Lives with: lives with their spouse Lives in: House/apartment Stairs: Yes: External: 2 steps; on right going up Has following equipment at home: None  OCCUPATION: retired  PLOF:  Independent  PATIENT GOALS: [facility], want to go back to walking and exercises, water aerobics  NEXT MD VISIT: 09/13/23  OBJECTIVE:  Note: Objective measures were completed at Evaluation unless otherwise noted.  DIAGNOSTIC FINDINGS: na  PATIENT SURVEYS:  KOOS Jr: 15/28  COGNITION: Overall cognitive status: Within functional limits for tasks assessed     SENSATION: WFL  EDEMA:  Mod R knee, medially  POSTURE: flexed B hips, maintains extended R knee in standing, steristrips R ant knee, scar forming normally  PALPATION: Tender R patella  LOWER EXTREMITY ROM:  AAROM Right eval Left eval  Hip flexion    Hip extension    Hip abduction    Hip adduction    Hip internal rotation    Hip external rotation    Knee flexion 90   Knee extension 0   Ankle dorsiflexion    Ankle plantarflexion    Ankle inversion    Ankle eversion     (Blank rows = wfl)  LOWER EXTREMITY MMT: seated R LAQ 12 degree lag R SLR with 31 degree lag  MMT R quads 3- R hams 4- R hip flexion 3+   FUNCTIONAL TESTS:  Timed up and go (TUG): 24 6 minute walk test: 2 min 30 sec  GAIT: Distance walked: 180 Assistive device utilized: Walker - 2 wheeled Level of assistance: Modified independence Comments: decreased L hip extension noted. R knee with excellent extension in stance, tends to lock B knees into extension, shortened step length B   TODAY'S TREATMENT:                                                                                                                              DATE:  10/31/23:  Recumbent bicycle 6 min  Lateral heel taps L from 2" step light B UE support Seated B hamstring curls 25# 3 x 10 Seated long arc quads 10# 3 x 10 Standing in ll bars for R LE weight bearing and reaching with L LE in star pattern Supine SLR 5 reps 3 sets   Vasopneumatic 10 min 34 degrees R LE   10/26/23:  Therex: instructed in the following ex to improve her endurance, flexibility L  knee, stability, proprioception and strength: Recumbent bike 6 min B knee flexion, B push down, eccentric R LE up 25# 3 x10 B knee ext B push up, R LE lowering 5#, 3 x  10 Standing for pillowcase slides L LE with R LE wbing, mass practice, no UE suport Lateral heel taps  L from 2" step, needed R hand support, 15x   10/24/23:  Measured R knee flexion 120, R long arc quad 3 degree lag Recumbent cycle, (airex in seat) x 6 min, level 2 Lateral L heel taps from 4" step, in ll bars, hands on rails, mass practice 2 laps, 100' in clinic without device, patient with good movement R knee,  L lateral lean noted with weight bearing on L LE Standing B plantarflexor stretch and B heel raises Knee extension 5 #, 3 x 10, B up, R LE down Knee flexion 25#, 3 x 10 B down, R LE up  Supine for game ready R LE x 10 min 34 degrees, R LE on wedge for edema management and intermittent compression  10/19/23:therapeutic exercise:  Instructed/progressed the pt in the following exercises to engage motor recruitment R LE, proprioception, flexibility, endurance. R knee flexion 117, long arc quad with 10 degree lag Recumbent cycle(airex pad in seat).  Initially rocking for 3 min, then able to complete full revolution x 3 min Standing for pillowcase slides L LE with R LE weight bearing, all directions for balance Lateral heel taps L from 2" step, B UE support needed B knee extension 10#, B concentric, unilateral eccentric lowering with R LE 10 x 3 B knee flexion 25#, concentric B, then eccentric R.  10 x 3 B plantarflexor stretch forefeet on black bar  Supine for vasopneumatic R knee x 10 min with R LE elevated for edema/ pain management  Vasopneumatic x 10 min, 34 degrees R LE elevated on wedge   10/17/23: Nustep, level , LE's only, 6 min B plantarflexor stretch, forefeet on black bar, with heel raises, 15 sec holds, 10 reps heel raises R long arc quads, 2# 15 x 2 R hamstring curls, green band, 15 x 2 Lateral L  heel taps from 2 " step in ll bars with B UE support, mass practice Post leans on wall for B heel rocks, 15x Gait with st cane, cues to elongate stride L LE to improve habitually flexed R knee Pillow case slides L LE with R LE wbing, for/back, and cw/ccw. Required L UE support   Vasopneumatic x 10 min, 34 degrees R LE elevated on wedge   10/12/23: Nustep: level 5, 6 min Seated R knee flexion stretch R long arc quads, therapist assisted with overpressure into terminal knee extension, with eccentric lowering 15 x Standing for post leans on wall with heel rocks to cue terminal knee ext 15 x B heel raises with plantarflexor stretch, forefeet on black bar 15x Seated hamstring curls , green theraband 15x R  Vasopneumatic x 10 min, 34 degrees R LE elevated on wedge     10/10/23 Bike L1 partial circles x 2 minutes Supine TKR exercises- HS, Knee press, SAQ, x 10 each  Supine SLR, minimal lag noted. Difficult, but performed 2 x 5. Seated LAQ x 15 Knee PROM 0-107 Standing TKE against ball on wall, 10 x 5 sec holds. VASO x 10 min med pressure for inflammation and pain relief, leg elevated.   10/05/23:  Nustep level 5, x 6 min  NMES:  utilized with [place] e-stim , 4 sec holds, 12 sec rest, 4 pads, quads, combined with short arc quads, 8 min total One set SLR 10 reps, still lag noted but able to perform I Vasopneumatic, utilized with R LE elevated on  wedge, x 10 min, 34 degrees, to decrease inflammation post ex soreness.  10/03/23:  Nustep level 5,LE's only x 6 min  Measured R knee flex 103, ext with 10 degree lag Long arc quads, terminal knee extension assisted by PT, with eccentric lowering Seated hamstring curls green band,  2 x15 NMES:  utilized with [place] e-stim , 4 sec holds, 12 sec rest, 4 pads, quads, combined with quad sets, 1/2 foam roll under knee for tactile cues 3 min , then 3 min with assisted SLR Standing for heel toe rocks to cue quads and challenge balance, required B UE  suppport Lat heel taps, R LE on 2 " block, B UE support, to fatigue   09/30/23 Nustep L 4 LE only PROM flex and ext AROM after 0-103 Green tband HS curl 2 sets 10 3# LAQ 2 sets 10 Step tap fwd and laterally 6 inch 10 x each Step up 4inch fwd and laterally 10 x each  09/28/23: Eval, then therex:  Nustep level 3, UE and LE 5 min Seated for long arc quads with theraband assist for terminal knee ext Standing for R SLR Standing for heel /toe rocks Gait x 3 laps with r walker, for 2 min, 30 sec    PATIENT EDUCATION:  Education details: POC, goals Person educated: Patient Education method: Programmer, multimedia, Demonstration, Actor cues, Verbal cues, and Handouts Education comprehension: verbalized understanding, returned demonstration, verbal cues required, and tactile cues required  HOME EXERCISE PROGRAM: Access Code: [email or web address] URL: [email or web address] Date: 09/28/2023 Prepared by: Christofer Shen  Exercises - Standing Hip Flexion with Counter Support  - 1 x daily - 7 x weekly - 3 sets - 10 reps - Heel Toe Raises with Unilateral Counter Support  - 1 x daily - 7 x weekly - 3 sets - 10 reps - Seated Long Arc Quad with Strap  - 1 x daily - 7 x weekly - 3 sets - 10 reps+  ASSESSMENT:  CLINICAL IMPRESSION: Patient progressing very well now, 8 weeks post op from her TKA R.  Gradually increasing her flexibility R knee. R quads continues to improve.   Still using cane for activities in community, feels some discomfort /insecurity with lateral step downs/ eccentrics She is nearing completion of her goals, improved balance on R LE today.  Still has lag with SLR and needs to improve distal control of quads. She continues to benefit from skilled PT to address her goals.  OBJECTIVE IMPAIRMENTS: decreased activity tolerance, decreased endurance, decreased mobility, difficulty walking, decreased ROM, decreased strength, increased edema, increased fascial restrictions, impaired perceived  functional ability, impaired flexibility, and pain.   ACTIVITY LIMITATIONS: carrying, lifting, standing, squatting, stairs, transfers, bed mobility, and locomotion level  PARTICIPATION LIMITATIONS: meal prep, cleaning, laundry, driving, shopping, community activity, yard work, and church  PERSONAL FACTORS: Behavior pattern, Fitness, Past/current experiences, Time since onset of injury/illness/exacerbation, and 1-2 comorbidities: recent angioedema due to rxn to mustard, h/o R TKA, h/o L THA  are also affecting patient's functional outcome.   REHAB POTENTIAL: Good  CLINICAL DECISION MAKING: Stable/uncomplicated  EVALUATION COMPLEXITY: Low   GOALS: Goals reviewed with patient? Yes  SHORT TERM GOALS: Target date: 10/12/23 I HEP Baseline: Goal status: from rehab 09/30/23 10/26/23: met    LONG TERM GOALS: Target date: 11/23/23:   KOOS Jr:  Baseline: 15/28 Goal status: INITIAL  2.  ROM R knee -2 to 115 Baseline: 0 to 90 Goal status: in progress: -5 to 120  3.  Strength R LE  quads, hamstrings 4+/5 with MMT for efficiency with transfers Baseline: 3-/5 Goal status: 10/26/23:In progress MMT today quads 4+/5, hamstrings 5/5 SLR with 8 degree lag, needs to improve  4.  TUG score 14 sec or less for reduced fall risk Baseline:  Goal status: 10/26/23: met  5.  6 min walk test, with minimal to no exertion, for improved ability to perform community ambulation Baseline: walked 2 min 30 sec with r walker then fatigued Goal status: 10/26/23:    PLAN:  PT FREQUENCY: 2x/week  PT DURATION: 8 weeks  PLANNED INTERVENTIONS: Therapeutic exercises, Therapeutic activity, Neuromuscular re-education, Balance training, Gait training, Patient/Family education, Self Care, and Joint mobilization  PLAN FOR NEXT SESSION: continue with endurance activities, progress with stretching for R knee flexion, progress strengthening R LE , gait, add proprioceptive activities as appropriate.  Early Chars,  PT 10/31/2023, 4:40 PM [facility] [address] [place], [place], [address] Phone: [phone]   Fax:  [phone]  Patient Details  Name: AIESHA LELAND MRN: [id number] Date of Birth: 10/20/1951 Referring Provider:  Kathryne Hitch*  Encounter Date: 10/31/2023

## 2023-11-02 ENCOUNTER — Other Ambulatory Visit: Payer: Self-pay

## 2023-11-02 ENCOUNTER — Ambulatory Visit: Payer: Medicare Other

## 2023-11-02 DIAGNOSIS — R262 Difficulty in walking, not elsewhere classified: Secondary | ICD-10-CM | POA: Diagnosis not present

## 2023-11-02 DIAGNOSIS — R6889 Other general symptoms and signs: Secondary | ICD-10-CM

## 2023-11-02 DIAGNOSIS — M25661 Stiffness of right knee, not elsewhere classified: Secondary | ICD-10-CM

## 2023-11-02 NOTE — Therapy (Signed)
OUTPATIENT PHYSICAL THERAPY LOWER EXTREMITY TREATMENT     Patient Name: Diana Bowers MRN: [id number] DOB:1951/07/10, 72 y.o., female Today's Date: 11/02/2023  END OF SESSION:  PT End of Session - 11/02/23 1114     Visit Number 12    Date for PT Re-Evaluation 11/23/23    Progress Note Due on Visit 20    PT Start Time 1055    PT Stop Time 1135    PT Time Calculation (min) 40 min                   Past Medical History:  Diagnosis Date   Anemia    Arthritis    "left knee" (01/01/2013)   Diabetes mellitus without complication (HCC)    borderline no med   DVT of lower extremity (deep venous thrombosis) (HCC) 12/27/2005   "LLE; from birth control pill" (01/01/2013)   GERD (gastroesophageal reflux disease)    Headache    pt. states migraines at times   Hypercholesteremia    Crestor   Hypertension    Past Surgical History:  Procedure Laterality Date   ANTERIOR CERVICAL DECOMP/DISCECTOMY FUSION  2006   CARPAL TUNNEL RELEASE  2000   "right" (01/01/2013)   CESAREAN SECTION  1978   COLONOSCOPY     ESOPHAGOGASTRODUODENOSCOPY     I & D KNEE WITH POLY EXCHANGE Left 09/15/2018   Procedure: POLY EXCHANGE LEFT KNEE,/,PATELLAR COMPONENT REVISION  STEROID INJECTION LEFT HIP;  Surgeon: Kathryne Hitch, MD;  Location: [facility];  Service: Orthopedics;  Laterality: Left;   KIDNEY DONATION  1994   KNEE ARTHROPLASTY  01/01/2013   Procedure: COMPUTER ASSISTED TOTAL KNEE ARTHROPLASTY;  Surgeon: Kerrin Champagne, MD;  Location: [facility];  Service: Orthopedics;  Laterality: Left;  Left computer assisted total knee replacement   LIPOMA EXCISION  2000   back   REPLACEMENT TOTAL KNEE  01/01/2013   "left" (01/01/2013)   TOTAL HIP ARTHROPLASTY Right 07/22/2015   Procedure: RIGHT TOTAL HIP ARTHROPLASTY ANTERIOR APPROACH;  Surgeon: Kathryne Hitch, MD;  Location: [facility];  Service: Orthopedics;  Laterality: Right;   TOTAL KNEE ARTHROPLASTY Right 09/02/2023   Procedure: RIGHT TOTAL KNEE ARTHROPLASTY;   Surgeon: Kathryne Hitch, MD;  Location: [facility];  Service: Orthopedics;  Laterality: Right;   Patient Active Problem List   Diagnosis Date Noted   OSA on CPAP 09/27/2023   Hypertension 09/26/2023   Acute pain of right knee 09/26/2023   Angio-edema 09/25/2023   S/P total knee replacement 09/03/2023   Status post total right knee replacement 09/02/2023   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  Rationale for Evaluation and Treatment: Rehabilitation  ONSET DATE: 09/03/23  SUBJECTIVE:   SUBJECTIVE STATEMENT: Patient reports feeling well, no new problems. L hip stiff, hip hurts at night some   PERTINENT HISTORY: Underwent elective R TKA 09/02/23.  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  Home 09/27/23.  Completed home health PT prior to her allergic reaction. PAIN:  Are you having pain? Yes: NPRS scale: 0-5/10 Pain location: R  knee Pain description: aches, Aggravating factors: movement Relieving factors: ice, meds  PRECAUTIONS: None  RED FLAGS: None   WEIGHT BEARING RESTRICTIONS: No  FALLS:  Has patient fallen in last 6 months? No  LIVING ENVIRONMENT: Lives with: lives with their spouse Lives in: House/apartment Stairs: Yes: External: 2 steps; on right going up Has following equipment at home: None  OCCUPATION: retired  PLOF: Independent  PATIENT GOALS: [facility], want to go back to walking and exercises, water aerobics  NEXT MD VISIT: 09/13/23  OBJECTIVE:  Note: Objective measures were completed at  Evaluation unless otherwise noted.  DIAGNOSTIC FINDINGS: na  PATIENT SURVEYS:  KOOS Jr: 15/28  COGNITION: Overall cognitive status: Within functional limits for tasks assessed     SENSATION: WFL  EDEMA:  Mod R knee, medially  POSTURE: flexed B hips, maintains extended R knee in standing, steristrips R ant knee, scar forming normally  PALPATION: Tender R patella  LOWER EXTREMITY ROM:  AAROM Right eval Left eval  Hip flexion    Hip extension    Hip abduction    Hip adduction    Hip internal rotation    Hip external rotation    Knee flexion 90   Knee extension 0   Ankle dorsiflexion    Ankle plantarflexion    Ankle inversion    Ankle eversion     (Blank rows = wfl)  LOWER EXTREMITY MMT: seated R LAQ 12 degree lag R SLR with 31 degree lag  MMT R quads 3- R hams 4- R hip flexion 3+   FUNCTIONAL TESTS:  Timed up and go (TUG): 24 6 minute walk test: 2 min 30 sec  GAIT: Distance walked: 180 Assistive device utilized: Walker - 2 wheeled Level of assistance: Modified independence Comments: decreased L hip extension noted. R knee with excellent extension in stance, tends to lock B knees into extension, shortened step length B   TODAY'S TREATMENT:                                                                                                                              DATE:  11/02/23: therex to improve flexibility R knee, strength, stability, balance Recumbent bicycle 6 min  Supine for SLR, 5 reps, 3 sets Standing post leans on wall, B heel rocks,with distal quads cxn 15x Knee ext, con B , ecc lowering R, 10# 3 x 10 Knee flexion 25# con B, eccentric return, 3 x 10 Lateral heel taps L from 2" step light B UE support   10/31/23:  Recumbent bicycle 6 min  Lateral heel taps L from 2" step light B UE support Seated B hamstring curls 25# 3 x 10 Seated long arc quads 10# 3 x 10 Standing in ll bars for R LE weight bearing and reaching with L LE in star  pattern Supine SLR 5 reps 3 sets   Vasopneumatic 10 min 34 degrees R LE   10/26/23:  Therex: instructed in the following ex to improve her endurance, flexibility L knee, stability, proprioception and strength: Recumbent bike 6 min B knee flexion, B push down, eccentric R LE up 25# 3 x10 B knee ext B push up, R LE lowering 5#, 3 x 10 Standing for pillowcase slides L LE with R LE wbing, mass practice, no UE suport Lateral heel taps  L from 2" step, needed R hand support, 15x   10/24/23:  Measured R knee flexion 120, R long arc quad 3 degree lag Recumbent cycle, (airex in seat) x 6 min, level 2 Lateral L heel taps from 4" step, in ll bars, hands on rails, mass practice 2 laps, 100' in clinic without device, patient with good movement R knee,  L lateral lean noted with weight bearing on L LE Standing B plantarflexor stretch and B heel raises Knee extension 5 #, 3 x 10, B up, R LE down Knee flexion 25#, 3 x 10 B down, R LE up  Supine for game ready R LE x 10 min 34 degrees, R LE on wedge for edema management and intermittent compression  10/19/23:therapeutic exercise:  Instructed/progressed the pt in the following exercises to engage motor recruitment R LE, proprioception, flexibility, endurance. R knee flexion 117, long arc quad with 10 degree lag Recumbent cycle(airex pad in seat).  Initially rocking for 3 min, then able to complete full revolution x 3 min Standing for pillowcase slides L LE with R LE weight bearing, all directions for balance Lateral heel taps L from 2" step, B UE support needed B knee extension 10#, B concentric, unilateral eccentric lowering with R LE 10 x 3 B knee flexion 25#, concentric B, then eccentric R.  10 x 3 B plantarflexor stretch forefeet on black bar  Supine for vasopneumatic R knee x 10 min with R LE elevated for edema/ pain management  Vasopneumatic x 10 min, 34 degrees R LE elevated on wedge   10/17/23: Nustep, level , LE's only, 6 min B  plantarflexor stretch, forefeet on black bar, with heel raises, 15 sec holds, 10 reps heel raises R long arc quads, 2# 15 x 2 R hamstring curls, green band, 15 x 2 Lateral L heel taps from 2 " step in ll bars with B UE support, mass practice Post leans on wall for B heel rocks, 15x Gait with st cane, cues to elongate stride L LE to improve habitually flexed R knee Pillow case slides L LE with R LE wbing, for/back, and cw/ccw. Required L UE support   Vasopneumatic x 10 min, 34 degrees R LE elevated on wedge   10/12/23: Nustep: level 5, 6 min Seated R knee flexion stretch R long arc quads, therapist assisted with overpressure into terminal knee extension, with eccentric lowering 15 x Standing for post leans on wall with heel rocks to cue terminal knee ext 15 x B heel raises with plantarflexor stretch, forefeet on black bar 15x Seated hamstring curls , green theraband 15x R  Vasopneumatic x 10 min, 34 degrees R LE elevated on wedge     10/10/23 Bike L1 partial circles x 2 minutes Supine TKR exercises- HS, Knee press, SAQ, x 10 each  Supine SLR, minimal lag noted. Difficult, but performed 2 x 5. Seated LAQ x 15 Knee PROM 0-107 Standing TKE against ball on wall, 10 x 5 sec holds. VASO x 10 min med pressure for inflammation and pain relief, leg elevated.   10/05/23:  Nustep level 5, x 6  min  NMES:  utilized with [place] e-stim , 4 sec holds, 12 sec rest, 4 pads, quads, combined with short arc quads, 8 min total One set SLR 10 reps, still lag noted but able to perform I Vasopneumatic, utilized with R LE elevated on wedge, x 10 min, 34 degrees, to decrease inflammation post ex soreness.  10/03/23:  Nustep level 5,LE's only x 6 min  Measured R knee flex 103, ext with 10 degree lag Long arc quads, terminal knee extension assisted by PT, with eccentric lowering Seated hamstring curls green band,  2 x15 NMES:  utilized with [place] e-stim , 4 sec holds, 12 sec rest, 4 pads, quads,  combined with quad sets, 1/2 foam roll under knee for tactile cues 3 min , then 3 min with assisted SLR Standing for heel toe rocks to cue quads and challenge balance, required B UE suppport Lat heel taps, R LE on 2 " block, B UE support, to fatigue   09/30/23 Nustep L 4 LE only PROM flex and ext AROM after 0-103 Green tband HS curl 2 sets 10 3# LAQ 2 sets 10 Step tap fwd and laterally 6 inch 10 x each Step up 4inch fwd and laterally 10 x each  09/28/23: Eval, then therex:  Nustep level 3, UE and LE 5 min Seated for long arc quads with theraband assist for terminal knee ext Standing for R SLR Standing for heel /toe rocks Gait x 3 laps with r walker, for 2 min, 30 sec    PATIENT EDUCATION:  Education details: POC, goals Person educated: Patient Education method: Programmer, multimedia, Demonstration, Actor cues, Verbal cues, and Handouts Education comprehension: verbalized understanding, returned demonstration, verbal cues required, and tactile cues required  HOME EXERCISE PROGRAM: Access Code: [email or web address] URL: [email or web address] Date: 09/28/2023 Prepared by: Kallyn Demarcus  Exercises - Standing Hip Flexion with Counter Support  - 1 x daily - 7 x weekly - 3 sets - 10 reps - Heel Toe Raises with Unilateral Counter Support  - 1 x daily - 7 x weekly - 3 sets - 10 reps - Seated Long Arc Quad with Strap  - 1 x daily - 7 x weekly - 3 sets - 10 reps+  ASSESSMENT:  CLINICAL IMPRESSION: Patient progressing very well now, 8 weeks post op from her TKA R.  This week we have concentrated more on achieving strength and control of her R distal quads,  SLR with better control today.  She is still edematous med R knee so continuing with the vasopneumatic. She continues to benefit from skilled PT to address her goals. She is using st cane when out of the house, her L hip is restricted and she is circumducting it which affects her stability as well as her ongoing recovery with R  knee.  OBJECTIVE IMPAIRMENTS: decreased activity tolerance, decreased endurance, decreased mobility, difficulty walking, decreased ROM, decreased strength, increased edema, increased fascial restrictions, impaired perceived functional ability, impaired flexibility, and pain.   ACTIVITY LIMITATIONS: carrying, lifting, standing, squatting, stairs, transfers, bed mobility, and locomotion level  PARTICIPATION LIMITATIONS: meal prep, cleaning, laundry, driving, shopping, community activity, yard work, and church  PERSONAL FACTORS: Behavior pattern, Fitness, Past/current experiences, Time since onset of injury/illness/exacerbation, and 1-2 comorbidities: recent angioedema due to rxn to mustard, h/o R TKA, h/o L THA  are also affecting patient's functional outcome.   REHAB POTENTIAL: Good  CLINICAL DECISION MAKING: Stable/uncomplicated  EVALUATION COMPLEXITY: Low   GOALS: Goals reviewed with patient? Yes  SHORT TERM GOALS:  Target date: 10/12/23 I HEP Baseline: Goal status: from rehab 09/30/23 10/26/23: met    LONG TERM GOALS: Target date: 11/23/23:   KOOS Jr:  Baseline: 15/28 Goal status: INITIAL  2.  ROM R knee -2 to 115 Baseline: 0 to 90 Goal status: in progress: -5 to 120  3.  Strength R LE quads, hamstrings 4+/5 with MMT for efficiency with transfers Baseline: 3-/5 Goal status: 10/26/23:In progress MMT today quads 4+/5, hamstrings 5/5 SLR with 8 degree lag, needs to improve  4.  TUG score 14 sec or less for reduced fall risk Baseline:  Goal status: 10/26/23: met  5.  6 min walk test, with minimal to no exertion, for improved ability to perform community ambulation Baseline: walked 2 min 30 sec with r walker then fatigued Goal status: 10/26/23:    PLAN:  PT FREQUENCY: 2x/week  PT DURATION: 8 weeks  PLANNED INTERVENTIONS: Therapeutic exercises, Therapeutic activity, Neuromuscular re-education, Balance training, Gait training, Patient/Family education, Self Care, and  Joint mobilization  PLAN FOR NEXT SESSION: continue with endurance activities, progress with stretching for R knee flexion, progress strengthening R LE , gait, add proprioceptive activities as appropriate.  Early Chars, PT, DPT, OCS 11/02/2023, 12:53 PM [facility] [place], [place], [address] Phone: [phone]   Fax:  [phone]  Patient Details  Name: Diana Bowers MRN: [id number] Date of Birth: 1951-03-10 Referring Provider:  Kathryne Hitch*  Encounter Date: 11/02/2023

## 2023-11-07 ENCOUNTER — Other Ambulatory Visit: Payer: Self-pay

## 2023-11-07 ENCOUNTER — Ambulatory Visit: Payer: Medicare Other

## 2023-11-07 DIAGNOSIS — R262 Difficulty in walking, not elsewhere classified: Secondary | ICD-10-CM | POA: Diagnosis not present

## 2023-11-07 DIAGNOSIS — R6889 Other general symptoms and signs: Secondary | ICD-10-CM

## 2023-11-07 DIAGNOSIS — M25661 Stiffness of right knee, not elsewhere classified: Secondary | ICD-10-CM

## 2023-11-07 NOTE — Therapy (Signed)
OUTPATIENT PHYSICAL THERAPY LOWER EXTREMITY TREATMENT     Patient Name: Diana Bowers MRN: [id number] DOB:02-05-1951, 72 y.o., female Today's Date: 11/07/2023  END OF SESSION:  PT End of Session - 11/07/23 1145     Visit Number 13    Date for PT Re-Evaluation 11/23/23    Progress Note Due on Visit 20    PT Start Time 1100    PT Stop Time 1150    PT Time Calculation (min) 50 min    Activity Tolerance Patient tolerated treatment well    Behavior During Therapy [facility] for tasks assessed/performed                    Past Medical History:  Diagnosis Date   Anemia    Arthritis    "left knee" (01/01/2013)   Diabetes mellitus without complication (HCC)    borderline no med   DVT of lower extremity (deep venous thrombosis) (HCC) 12/27/2005   "LLE; from birth control pill" (01/01/2013)   GERD (gastroesophageal reflux disease)    Headache    pt. states migraines at times   Hypercholesteremia    Crestor   Hypertension    Past Surgical History:  Procedure Laterality Date   ANTERIOR CERVICAL DECOMP/DISCECTOMY FUSION  2006   CARPAL TUNNEL RELEASE  2000   "right" (01/01/2013)   CESAREAN SECTION  1978   COLONOSCOPY     ESOPHAGOGASTRODUODENOSCOPY     I & D KNEE WITH POLY EXCHANGE Left 09/15/2018   Procedure: POLY EXCHANGE LEFT KNEE,/,PATELLAR COMPONENT REVISION  STEROID INJECTION LEFT HIP;  Surgeon: Kathryne Hitch, MD;  Location: [facility];  Service: Orthopedics;  Laterality: Left;   KIDNEY DONATION  1994   KNEE ARTHROPLASTY  01/01/2013   Procedure: COMPUTER ASSISTED TOTAL KNEE ARTHROPLASTY;  Surgeon: Kerrin Champagne, MD;  Location: [facility];  Service: Orthopedics;  Laterality: Left;  Left computer assisted total knee replacement   LIPOMA EXCISION  2000   back   REPLACEMENT TOTAL KNEE  01/01/2013   "left" (01/01/2013)   TOTAL HIP ARTHROPLASTY Right 07/22/2015   Procedure: RIGHT TOTAL HIP ARTHROPLASTY ANTERIOR APPROACH;  Surgeon: Kathryne Hitch, MD;  Location: [facility];  Service:  Orthopedics;  Laterality: Right;   TOTAL KNEE ARTHROPLASTY Right 09/02/2023   Procedure: RIGHT TOTAL KNEE ARTHROPLASTY;  Surgeon: Kathryne Hitch, MD;  Location: [facility];  Service: Orthopedics;  Laterality: Right;   Patient Active Problem List   Diagnosis Date Noted   OSA on CPAP 09/27/2023   Hypertension 09/26/2023   Acute pain of right knee 09/26/2023   Angio-edema 09/25/2023   S/P total knee replacement 09/03/2023   Status post total right knee replacement 09/02/2023   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   Status post revision of total replacement of left knee 09/15/2018   Trochanteric bursitis, left hip 08/10/2017   Osteoarthritis of right hip 07/22/2015   Status post total replacement of right hip 07/22/2015   Acute blood loss anemia 01/04/2013    PCP: Caffie Damme, MD  REFERRING PROVIDER: Doneen Poisson, MD  REFERRING DIAG: s/p R TKA  THERAPY DIAG:  Difficulty in walking, not elsewhere classified  Decreased functional activity tolerance  Stiffness of right knee, not elsewhere classified  Rationale for Evaluation and Treatment: Rehabilitation  ONSET DATE: 09/03/23  SUBJECTIVE:   SUBJECTIVE STATEMENT: Patient reports feeling well, no new problems. L hip stiff, hip hurts at night some, R knee less swollen, not using cane to walk today  PERTINENT HISTORY: Underwent elective R TKA 09/02/23.  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  Home 09/27/23.  Completed home health PT prior to her allergic reaction. PAIN:  Are you having pain? Yes: NPRS scale: 0-5/10 Pain location: R knee Pain description: aches, Aggravating factors: movement Relieving factors: ice, meds  PRECAUTIONS: None  RED FLAGS: None   WEIGHT BEARING RESTRICTIONS: No  FALLS:  Has patient fallen in last 6 months? No  LIVING ENVIRONMENT: Lives with: lives with their spouse Lives in: House/apartment Stairs: Yes:  External: 2 steps; on right going up Has following equipment at home: None  OCCUPATION: retired  PLOF: Independent  PATIENT GOALS: [facility], want to go back to walking and exercises, water aerobics  NEXT MD VISIT: 09/13/23  OBJECTIVE:  Note: Objective measures were completed at Evaluation unless otherwise noted.  DIAGNOSTIC FINDINGS: na  PATIENT SURVEYS:  KOOS Jr: 15/28  COGNITION: Overall cognitive status: Within functional limits for tasks assessed     SENSATION: WFL  EDEMA:  Mod R knee, medially  POSTURE: flexed B hips, maintains extended R knee in standing, steristrips R ant knee, scar forming normally  PALPATION: Tender R patella  LOWER EXTREMITY ROM:  AAROM Right eval Left eval  Hip flexion    Hip extension    Hip abduction    Hip adduction    Hip internal rotation    Hip external rotation    Knee flexion 90   Knee extension 0   Ankle dorsiflexion    Ankle plantarflexion    Ankle inversion    Ankle eversion     (Blank rows = wfl)  LOWER EXTREMITY MMT: seated R LAQ 12 degree lag R SLR with 31 degree lag  MMT R quads 3- R hams 4- R hip flexion 3+   FUNCTIONAL TESTS:  Timed up and go (TUG): 24 6 minute walk test: 2 min 30 sec  GAIT: Distance walked: 180 Assistive device utilized: Walker - 2 wheeled Level of assistance: Modified independence Comments: decreased L hip extension noted. R knee with excellent extension in stance, tends to lock B knees into extension, shortened step length B   TODAY'S TREATMENT:                                                                                                                              DATE:  11/07/23: Therapeutic exercise; Recumbent bicycle 6 min SLR in supine R LE 2 sets 10 reps light assistance by therapist to cue pt to engage distal quads  Manual stretching by therapist L hip  IR /ER , gentle with lateral distraction of femoral jt. Gait training, alternating between normal gait  and elongated steps to improve stance time and reduce circumduction L LE Knee flexion 25# 3 x 10 Knee extension 10 # 3 x 10 Leg press 30#, B LE's 3 x 10 Leg press B ankle plantarflexion 30# 3 x 10  11/02/23: therex to improve flexibility R knee, strength, stability, balance Recumbent bicycle 6 min  Supine for SLR, 5 reps, 3 sets Standing post leans on wall, B heel rocks,with distal quads cxn 15x Knee ext, con B , ecc lowering R, 10# 3 x 10 Knee flexion 25# con B, eccentric return, 3 x 10 Lateral heel taps L from 2" step light B UE support  Vasopneumatic R knee at end of session for 10 min   10/31/23:  Recumbent bicycle 6 min  Lateral heel taps L from 2" step light B UE support Seated B hamstring curls 25# 3 x 10 Seated long arc quads 10# 3 x 10 Standing in ll bars for R LE weight bearing and reaching with L LE in star pattern Supine SLR 5 reps 3 sets   Vasopneumatic 10 min 34 degrees R LE   10/26/23:  Therex: instructed in the following ex to improve her endurance, flexibility L knee, stability, proprioception and strength: Recumbent bike 6 min B knee flexion, B push down, eccentric R LE up 25# 3 x10 B knee ext B push up, R LE lowering 5#, 3 x 10 Standing for pillowcase slides L LE with R LE wbing, mass practice, no UE suport Lateral heel taps  L from 2" step, needed R hand support, 15x   10/24/23:  Measured R knee flexion 120, R long arc quad 3 degree lag Recumbent cycle, (airex in seat) x 6 min, level 2 Lateral L heel taps from 4" step, in ll bars, hands on rails, mass practice 2 laps, 100' in clinic without device, patient with good movement R knee,  L lateral lean noted with weight bearing on L LE Standing B plantarflexor stretch and B heel raises Knee extension 5 #, 3 x 10, B up, R LE down Knee flexion 25#, 3 x 10 B down, R LE up  Supine for game ready R LE x 10 min 34 degrees, R LE on wedge for edema management and intermittent compression  10/19/23:therapeutic  exercise:  Instructed/progressed the pt in the following exercises to engage motor recruitment R LE, proprioception, flexibility, endurance. R knee flexion 117, long arc quad with 10 degree lag Recumbent cycle(airex pad in seat).  Initially rocking for 3 min, then able to complete full revolution x 3 min Standing for pillowcase slides L LE with R LE weight bearing, all directions for balance Lateral heel taps L from 2" step, B UE support needed B knee extension 10#, B concentric, unilateral eccentric lowering with R LE 10 x 3 B knee flexion 25#, concentric B, then eccentric R.  10 x 3 B plantarflexor stretch forefeet on black bar  Supine for vasopneumatic R knee x 10 min with R LE elevated for edema/ pain management  Vasopneumatic x 10 min, 34 degrees R LE elevated on wedge   10/17/23: Nustep, level , LE's only, 6 min B plantarflexor stretch, forefeet on black bar, with heel raises, 15 sec holds, 10 reps heel raises R long arc quads, 2# 15 x 2 R hamstring curls, green band, 15 x 2 Lateral L heel taps from 2 " step in ll bars with B UE support, mass practice Post leans on wall for B heel rocks, 15x Gait with st cane, cues to elongate stride L LE to improve habitually flexed R knee Pillow case slides L LE with R LE wbing, for/back, and cw/ccw. Required L UE support   Vasopneumatic x 10 min, 34 degrees R LE elevated on wedge  10/12/23: Nustep: level 5, 6 min Seated R knee flexion stretch R long arc quads, therapist assisted with overpressure into terminal knee extension, with eccentric lowering 15 x Standing for post leans on wall with heel rocks to cue terminal knee ext 15 x B heel raises with plantarflexor stretch, forefeet on black bar 15x Seated hamstring curls , green theraband 15x R  Vasopneumatic x 10 min, 34 degrees R LE elevated on wedge     10/10/23 Bike L1 partial circles x 2 minutes Supine TKR exercises- HS, Knee press, SAQ, x 10 each  Supine SLR, minimal lag noted.  Difficult, but performed 2 x 5. Seated LAQ x 15 Knee PROM 0-107 Standing TKE against ball on wall, 10 x 5 sec holds. VASO x 10 min med pressure for inflammation and pain relief, leg elevated.   10/05/23:  Nustep level 5, x 6 min  NMES:  utilized with [place] e-stim , 4 sec holds, 12 sec rest, 4 pads, quads, combined with short arc quads, 8 min total One set SLR 10 reps, still lag noted but able to perform I Vasopneumatic, utilized with R LE elevated on wedge, x 10 min, 34 degrees, to decrease inflammation post ex soreness.  10/03/23:  Nustep level 5,LE's only x 6 min  Measured R knee flex 103, ext with 10 degree lag Long arc quads, terminal knee extension assisted by PT, with eccentric lowering Seated hamstring curls green band,  2 x15 NMES:  utilized with [place] e-stim , 4 sec holds, 12 sec rest, 4 pads, quads, combined with quad sets, 1/2 foam roll under knee for tactile cues 3 min , then 3 min with assisted SLR Standing for heel toe rocks to cue quads and challenge balance, required B UE suppport Lat heel taps, R LE on 2 " block, B UE support, to fatigue   09/30/23 Nustep L 4 LE only PROM flex and ext AROM after 0-103 Green tband HS curl 2 sets 10 3# LAQ 2 sets 10 Step tap fwd and laterally 6 inch 10 x each Step up 4inch fwd and laterally 10 x each  09/28/23: Eval, then therex:  Nustep level 3, UE and LE 5 min Seated for long arc quads with theraband assist for terminal knee ext Standing for R SLR Standing for heel /toe rocks Gait x 3 laps with r walker, for 2 min, 30 sec    PATIENT EDUCATION:  Education details: POC, goals Person educated: Patient Education method: Programmer, multimedia, Demonstration, Actor cues, Verbal cues, and Handouts Education comprehension: verbalized understanding, returned demonstration, verbal cues required, and tactile cues required  HOME EXERCISE PROGRAM: Access Code: [email or web address] URL: [email or web address] Date:  09/28/2023 Prepared by: Tiaunna Buford  Exercises - Standing Hip Flexion with Counter Support  - 1 x daily - 7 x weekly - 3 sets - 10 reps - Heel Toe Raises with Unilateral Counter Support  - 1 x daily - 7 x weekly - 3 sets - 10 reps - Seated Long Arc Quad with Strap  - 1 x daily - 7 x weekly - 3 sets - 10 reps+  ASSESSMENT:  CLINICAL IMPRESSION: Patient progressing very well now, 9 weeks post op from her TKA R.  Better control of R knee extensor lag today with SLR.  Less edema so no vasopneumatic today. She continues to benefit from skilled PT to address her goals. Still circumducting LLE , vaulting on R LE , tried to address today.  Overall much improved.  Will need to measure ROM  next visit.  She will return to surgeon next week and most likely nearing dc status.   OBJECTIVE IMPAIRMENTS: decreased activity tolerance, decreased endurance, decreased mobility, difficulty walking, decreased ROM, decreased strength, increased edema, increased fascial restrictions, impaired perceived functional ability, impaired flexibility, and pain.   ACTIVITY LIMITATIONS: carrying, lifting, standing, squatting, stairs, transfers, bed mobility, and locomotion level  PARTICIPATION LIMITATIONS: meal prep, cleaning, laundry, driving, shopping, community activity, yard work, and church  PERSONAL FACTORS: Behavior pattern, Fitness, Past/current experiences, Time since onset of injury/illness/exacerbation, and 1-2 comorbidities: recent angioedema due to rxn to mustard, h/o R TKA, h/o L THA  are also affecting patient's functional outcome.   REHAB POTENTIAL: Good  CLINICAL DECISION MAKING: Stable/uncomplicated  EVALUATION COMPLEXITY: Low   GOALS: Goals reviewed with patient? Yes  SHORT TERM GOALS: Target date: 10/12/23 I HEP Baseline: Goal status: from rehab 09/30/23 10/26/23: met    LONG TERM GOALS: Target date: 11/23/23:   KOOS Jr:  Baseline: 15/28 Goal status: INITIAL  2.  ROM R knee -2 to  115 Baseline: 0 to 90 Goal status: in progress: -5 to 120  3.  Strength R LE quads, hamstrings 4+/5 with MMT for efficiency with transfers Baseline: 3-/5 Goal status: 10/26/23:In progress MMT today quads 4+/5, hamstrings 5/5 SLR with 8 degree lag, needs to improve  4.  TUG score 14 sec or less for reduced fall risk Baseline:  Goal status: 10/26/23: met  5.  6 min walk test, with minimal to no exertion, for improved ability to perform community ambulation Baseline: walked 2 min 30 sec with r walker then fatigued Goal status: 10/26/23: met   PLAN:  PT FREQUENCY: 2x/week  PT DURATION: 8 weeks  PLANNED INTERVENTIONS: Therapeutic exercises, Therapeutic activity, Neuromuscular re-education, Balance training, Gait training, Patient/Family education, Self Care, and Joint mobilization  PLAN FOR NEXT SESSION: continue with endurance activities, progress with stretching for R knee flexion, progress strengthening R LE , gait, add proprioceptive activities as appropriate.  Early Chars, PT, DPT, OCS 11/07/2023, 11:58 AM [place]. [place], [place], [address] Phone: [phone]   Fax:  [phone]  Patient Details  Name: Diana Bowers MRN: [id number] Date of Birth: 17-Jul-1951 Referring Provider:  Kathryne Hitch*  Encounter Date: 11/07/2023

## 2023-11-09 ENCOUNTER — Ambulatory Visit: Payer: Medicare Other

## 2023-11-14 ENCOUNTER — Ambulatory Visit (INDEPENDENT_AMBULATORY_CARE_PROVIDER_SITE_OTHER): Payer: Medicare Other | Admitting: Orthopaedic Surgery

## 2023-11-14 ENCOUNTER — Encounter: Payer: Self-pay | Admitting: Orthopaedic Surgery

## 2023-11-14 ENCOUNTER — Other Ambulatory Visit (INDEPENDENT_AMBULATORY_CARE_PROVIDER_SITE_OTHER): Payer: Medicare Other

## 2023-11-14 ENCOUNTER — Other Ambulatory Visit: Payer: Self-pay

## 2023-11-14 ENCOUNTER — Ambulatory Visit: Payer: Medicare Other

## 2023-11-14 DIAGNOSIS — Z96651 Presence of right artificial knee joint: Secondary | ICD-10-CM

## 2023-11-14 DIAGNOSIS — R262 Difficulty in walking, not elsewhere classified: Secondary | ICD-10-CM

## 2023-11-14 DIAGNOSIS — M25661 Stiffness of right knee, not elsewhere classified: Secondary | ICD-10-CM

## 2023-11-14 DIAGNOSIS — R6889 Other general symptoms and signs: Secondary | ICD-10-CM

## 2023-11-14 NOTE — Progress Notes (Signed)
The patient is now about 10 weeks status post a right total knee replacement.  She is being seen today in follow-up.  She is walking without assistive device.  She reports that therapy has been able to flex her knee to 120 degrees and her extension is also full.  She has no significant complaints other than some swelling.  On exam there is some swelling to be expected at 10 weeks but overall the knee looks great.  Her range of motion is entirely full and the knee feels ligamentously stable.  An AP and lateral of the right knee show well-seated total knee arthroplasty with no complicating features.  At this point she can continue to increase her activities as comfort allows.  The next time we need to see is not for 6 months unless she is having any issues at all.  Will have 2 views of her right knee at that visit.

## 2023-11-14 NOTE — Therapy (Signed)
OUTPATIENT PHYSICAL THERAPY LOWER EXTREMITY TREATMENT     Patient Name: Diana Bowers MRN: [id number] DOB:[birth year], 72 y.o., female Today's Date: 11/14/2023  END OF SESSION:  PT End of Session - 11/14/23 1100     Visit Number 14    Date for PT Re-Evaluation 11/23/23    Progress Note Due on Visit 20    PT Start Time 1057    PT Stop Time 1140    PT Time Calculation (min) 43 min    Activity Tolerance Patient tolerated treatment well    Behavior During Therapy [facility] for tasks assessed/performed                     Past Medical History:  Diagnosis Date   Anemia    Arthritis    "left knee" (01/01/2013)   Diabetes mellitus without complication (HCC)    borderline no med   DVT of lower extremity (deep venous thrombosis) (HCC) 12/27/2005   "LLE; from birth control pill" (01/01/2013)   GERD (gastroesophageal reflux disease)    Headache    pt. states migraines at times   Hypercholesteremia    Crestor   Hypertension    Past Surgical History:  Procedure Laterality Date   ANTERIOR CERVICAL DECOMP/DISCECTOMY FUSION  2006   CARPAL TUNNEL RELEASE  2000   "right" (01/01/2013)   CESAREAN SECTION  1978   COLONOSCOPY     ESOPHAGOGASTRODUODENOSCOPY     I & D KNEE WITH POLY EXCHANGE Left 09/15/2018   Procedure: POLY EXCHANGE LEFT KNEE,/,PATELLAR COMPONENT REVISION  STEROID INJECTION LEFT HIP;  Surgeon: Kathryne Hitch, MD;  Location: [facility];  Service: Orthopedics;  Laterality: Left;   KIDNEY DONATION  1994   KNEE ARTHROPLASTY  01/01/2013   Procedure: COMPUTER ASSISTED TOTAL KNEE ARTHROPLASTY;  Surgeon: Kerrin Champagne, MD;  Location: [facility];  Service: Orthopedics;  Laterality: Left;  Left computer assisted total knee replacement   LIPOMA EXCISION  2000   back   REPLACEMENT TOTAL KNEE  01/01/2013   "left" (01/01/2013)   TOTAL HIP ARTHROPLASTY Right 07/22/2015   Procedure: RIGHT TOTAL HIP ARTHROPLASTY ANTERIOR APPROACH;  Surgeon: Kathryne Hitch, MD;  Location: [facility];   Service: Orthopedics;  Laterality: Right;   TOTAL KNEE ARTHROPLASTY Right 09/02/2023   Procedure: RIGHT TOTAL KNEE ARTHROPLASTY;  Surgeon: Kathryne Hitch, MD;  Location: [facility];  Service: Orthopedics;  Laterality: Right;   Patient Active Problem List   Diagnosis Date Noted   OSA on CPAP 09/27/2023   Hypertension 09/26/2023   Acute pain of right knee 09/26/2023   Angio-edema 09/25/2023   S/P total knee replacement 09/03/2023   Status post total right knee replacement 09/02/2023   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   Status post revision of total replacement of left knee 09/15/2018   Trochanteric bursitis, left hip 08/10/2017   Osteoarthritis of right hip 07/22/2015   Status post total replacement of right hip 07/22/2015   Acute blood loss anemia 01/04/2013    PCP: Caffie Damme, MD  REFERRING PROVIDER: Doneen Poisson, MD  REFERRING DIAG: s/p R TKA  THERAPY DIAG:  Difficulty in walking, not elsewhere classified  Decreased functional activity tolerance  Stiffness of right knee, not elsewhere classified  Rationale for Evaluation and Treatment: Rehabilitation  ONSET DATE: 09/03/23  SUBJECTIVE:   SUBJECTIVE STATEMENT: Patient reports feeling well, no new problems. [facility] and walked, also participated in one class PERTINENT  HISTORY: Underwent elective R TKA 09/02/23.  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  Home 09/27/23.  Completed home health PT prior to her allergic reaction. PAIN:  Are you having pain? Yes: NPRS scale: 0-5/10 Pain location: R knee Pain description: aches, Aggravating factors: movement Relieving factors: ice, meds  PRECAUTIONS: None  RED FLAGS: None   WEIGHT BEARING RESTRICTIONS: No  FALLS:  Has patient fallen in last 6 months? No  LIVING ENVIRONMENT: Lives with: lives with their spouse Lives in: House/apartment Stairs: Yes:  External: 2 steps; on right going up Has following equipment at home: None  OCCUPATION: retired  PLOF: Independent  PATIENT GOALS: [facility], want to go back to walking and exercises, water aerobics  NEXT MD VISIT: 09/13/23  OBJECTIVE:  Note: Objective measures were completed at Evaluation unless otherwise noted.  DIAGNOSTIC FINDINGS: na  PATIENT SURVEYS:  KOOS Jr: 15/28  COGNITION: Overall cognitive status: Within functional limits for tasks assessed     SENSATION: WFL  EDEMA:  Mod R knee, medially  POSTURE: flexed B hips, maintains extended R knee in standing, steristrips R ant knee, scar forming normally  PALPATION: Tender R patella  LOWER EXTREMITY ROM:  AAROM Right eval Left eval  Hip flexion    Hip extension    Hip abduction    Hip adduction    Hip internal rotation    Hip external rotation    Knee flexion 90   Knee extension 0   Ankle dorsiflexion    Ankle plantarflexion    Ankle inversion    Ankle eversion     (Blank rows = wfl)  LOWER EXTREMITY MMT: seated R LAQ 12 degree lag R SLR with 31 degree lag  MMT R quads 3- R hams 4- R hip flexion 3+   FUNCTIONAL TESTS:  Timed up and go (TUG): 24 6 minute walk test: 2 min 30 sec  GAIT: Distance walked: 180 Assistive device utilized: Walker - 2 wheeled Level of assistance: Modified independence Comments: decreased L hip extension noted. R knee with excellent extension in stance, tends to lock B knees into extension, shortened step length B   TODAY'S TREATMENT:                                                                                                                              DATE:  11/14/23:therex:  Recumbent bicycle x 5 min, with seat in cushion Measured R knee flexion 120 in sitting, AROM R knee ext in supine 0 R long arc quad with 7 degree extensor lag SLR able to complete 10 reps without lag then begins to lag with fatigue Stair climbing, able to ascend 8" steps  reciprocally, unable to descend from 8" step but able to from 4" step  B Knee extension 10#, 15 reps x 2 sets B Knee flexion 35# 15 reps, 2 sets B Leg press 30#, 15 x 2 Ankle  plantarflexion B 30# 15 x 2    11/07/23: Therapeutic exercise; Recumbent bicycle 6 min SLR in supine R LE 2 sets 10 reps light assistance by therapist to cue pt to engage distal quads  Manual stretching by therapist L hip  IR /ER , gentle with lateral distraction of femoral jt. Gait training, alternating between normal gait and elongated steps to improve stance time and reduce circumduction L LE Knee flexion 25# 3 x 10 Knee extension 10 # 3 x 10 Leg press 30#, B LE's 3 x 10 Leg press B ankle plantarflexion 30# 3 x 10   11/02/23: therex to improve flexibility R knee, strength, stability, balance Recumbent bicycle 6 min  Supine for SLR, 5 reps, 3 sets Standing post leans on wall, B heel rocks,with distal quads cxn 15x Knee ext, con B , ecc lowering R, 10# 3 x 10 Knee flexion 25# con B, eccentric return, 3 x 10 Lateral heel taps L from 2" step light B UE support  Vasopneumatic R knee at end of session for 10 min   10/31/23:  Recumbent bicycle 6 min  Lateral heel taps L from 2" step light B UE support Seated B hamstring curls 25# 3 x 10 Seated long arc quads 10# 3 x 10 Standing in ll bars for R LE weight bearing and reaching with L LE in star pattern Supine SLR 5 reps 3 sets   Vasopneumatic 10 min 34 degrees R LE   10/26/23:  Therex: instructed in the following ex to improve her endurance, flexibility L knee, stability, proprioception and strength: Recumbent bike 6 min B knee flexion, B push down, eccentric R LE up 25# 3 x10 B knee ext B push up, R LE lowering 5#, 3 x 10 Standing for pillowcase slides L LE with R LE wbing, mass practice, no UE suport Lateral heel taps  L from 2" step, needed R hand support, 15x   10/24/23:  Measured R knee flexion 120, R long arc quad 3 degree lag Recumbent cycle,  (airex in seat) x 6 min, level 2 Lateral L heel taps from 4" step, in ll bars, hands on rails, mass practice 2 laps, 100' in clinic without device, patient with good movement R knee,  L lateral lean noted with weight bearing on L LE Standing B plantarflexor stretch and B heel raises Knee extension 5 #, 3 x 10, B up, R LE down Knee flexion 25#, 3 x 10 B down, R LE up  Supine for game ready R LE x 10 min 34 degrees, R LE on wedge for edema management and intermittent compression  10/19/23:therapeutic exercise:  Instructed/progressed the pt in the following exercises to engage motor recruitment R LE, proprioception, flexibility, endurance. R knee flexion 117, long arc quad with 10 degree lag Recumbent cycle(airex pad in seat).  Initially rocking for 3 min, then able to complete full revolution x 3 min Standing for pillowcase slides L LE with R LE weight bearing, all directions for balance Lateral heel taps L from 2" step, B UE support needed B knee extension 10#, B concentric, unilateral eccentric lowering with R LE 10 x 3 B knee flexion 25#, concentric B, then eccentric R.  10 x 3 B plantarflexor stretch forefeet on black bar  Supine for vasopneumatic R knee x 10 min with R LE elevated for edema/ pain management  Vasopneumatic x 10 min, 34 degrees R LE elevated on wedge   10/17/23: Nustep, level , LE's only, 6 min B  plantarflexor stretch, forefeet on black bar, with heel raises, 15 sec holds, 10 reps heel raises R long arc quads, 2# 15 x 2 R hamstring curls, green band, 15 x 2 Lateral L heel taps from 2 " step in ll bars with B UE support, mass practice Post leans on wall for B heel rocks, 15x Gait with st cane, cues to elongate stride L LE to improve habitually flexed R knee Pillow case slides L LE with R LE wbing, for/back, and cw/ccw. Required L UE support   Vasopneumatic x 10 min, 34 degrees R LE elevated on wedge   10/12/23: Nustep: level 5, 6 min Seated R knee flexion  stretch R long arc quads, therapist assisted with overpressure into terminal knee extension, with eccentric lowering 15 x Standing for post leans on wall with heel rocks to cue terminal knee ext 15 x B heel raises with plantarflexor stretch, forefeet on black bar 15x Seated hamstring curls , green theraband 15x R  Vasopneumatic x 10 min, 34 degrees R LE elevated on wedge     10/10/23 Bike L1 partial circles x 2 minutes Supine TKR exercises- HS, Knee press, SAQ, x 10 each  Supine SLR, minimal lag noted. Difficult, but performed 2 x 5. Seated LAQ x 15 Knee PROM 0-107 Standing TKE against ball on wall, 10 x 5 sec holds. VASO x 10 min med pressure for inflammation and pain relief, leg elevated.   10/05/23:  Nustep level 5, x 6 min  NMES:  utilized with [place] e-stim , 4 sec holds, 12 sec rest, 4 pads, quads, combined with short arc quads, 8 min total One set SLR 10 reps, still lag noted but able to perform I Vasopneumatic, utilized with R LE elevated on wedge, x 10 min, 34 degrees, to decrease inflammation post ex soreness.  10/03/23:  Nustep level 5,LE's only x 6 min  Measured R knee flex 103, ext with 10 degree lag Long arc quads, terminal knee extension assisted by PT, with eccentric lowering Seated hamstring curls green band,  2 x15 NMES:  utilized with [place] e-stim , 4 sec holds, 12 sec rest, 4 pads, quads, combined with quad sets, 1/2 foam roll under knee for tactile cues 3 min , then 3 min with assisted SLR Standing for heel toe rocks to cue quads and challenge balance, required B UE suppport Lat heel taps, R LE on 2 " block, B UE support, to fatigue   09/30/23 Nustep L 4 LE only PROM flex and ext AROM after 0-103 Green tband HS curl 2 sets 10 3# LAQ 2 sets 10 Step tap fwd and laterally 6 inch 10 x each Step up 4inch fwd and laterally 10 x each  09/28/23: Eval, then therex:  Nustep level 3, UE and LE 5 min Seated for long arc quads with theraband assist for  terminal knee ext Standing for R SLR Standing for heel /toe rocks Gait x 3 laps with r walker, for 2 min, 30 sec    PATIENT EDUCATION:  Education details: POC, goals Person educated: Patient Education method: Programmer, multimedia, Demonstration, Actor cues, Verbal cues, and Handouts Education comprehension: verbalized understanding, returned demonstration, verbal cues required, and tactile cues required  HOME EXERCISE PROGRAM: Access Code: [email or web address] URL: [email or web address] Date: 09/28/2023 Prepared by: Erion Weightman  Exercises - Standing Hip Flexion with Counter Support  - 1 x daily - 7 x weekly - 3 sets - 10 reps - Heel Toe Raises with Unilateral Counter Support  -  1 x daily - 7 x weekly - 3 sets - 10 reps - Seated Long Arc Quad with Strap  - 1 x daily - 7 x weekly - 3 sets - 10 reps+  ASSESSMENT:  CLINICAL IMPRESSION: Patient progressing very well now, 10 weeks post op from her TKA R.  Better control of R knee extensor lag today with SLR.  Less edema so no vasopneumatic again today. She is performing all of her functional tasks without difficulty or R knee pain.  To see her surgeon today.  Goals are met so either DC today or at next scheduled visit, she wishes to await her surgeons appt today.   Overall much improved.  Will need to measure ROM next visit.  She will return to surgeon next week and most likely nearing dc status.   OBJECTIVE IMPAIRMENTS: decreased activity tolerance, decreased endurance, decreased mobility, difficulty walking, decreased ROM, decreased strength, increased edema, increased fascial restrictions, impaired perceived functional ability, impaired flexibility, and pain.   ACTIVITY LIMITATIONS: carrying, lifting, standing, squatting, stairs, transfers, bed mobility, and locomotion level  PARTICIPATION LIMITATIONS: meal prep, cleaning, laundry, driving, shopping, community activity, yard work, and church  PERSONAL FACTORS: Behavior pattern, Fitness,  Past/current experiences, Time since onset of injury/illness/exacerbation, and 1-2 comorbidities: recent angioedema due to rxn to mustard, h/o R TKA, h/o L THA  are also affecting patient's functional outcome.   REHAB POTENTIAL: Good  CLINICAL DECISION MAKING: Stable/uncomplicated  EVALUATION COMPLEXITY: Low   GOALS: Goals reviewed with patient? Yes  SHORT TERM GOALS: Target date: 10/12/23 I HEP Baseline: Goal status: from rehab 09/30/23 10/26/23: met    LONG TERM GOALS: Target date: 11/23/23:   KOOS Jr:  Baseline: 15/28 Goal status: 11/14/23:  1/28  2.  ROM R knee -2 to 115 Baseline: 0 to 90 Goal status: in progress: -5 to 120 11/14/23: met 0 to 120  3.  Strength R LE quads, hamstrings 4+/5 with MMT for efficiency with transfers Baseline: 3-/5 Goal status: 10/26/23:In progress MMT today quads 4+/5, hamstrings 5/5 SLR with 8 degree lag, needs to improve 11/14/23: met , no lag with SLR , MMT with burst testing 5/5 hamstrings, quads  4.  TUG score 14 sec or less for reduced fall risk Baseline:  Goal status: 10/26/23: met  5.  6 min walk test, with minimal to no exertion, for improved ability to perform community ambulation Baseline: walked 2 min 30 sec with r walker then fatigued Goal status: 10/26/23: met   PLAN:  PT FREQUENCY: 2x/week  PT DURATION: 8 weeks  PLANNED INTERVENTIONS: Therapeutic exercises, Therapeutic activity, Neuromuscular re-education, Balance training, Gait training, Patient/Family education, Self Care, and Joint mobilization  PLAN FOR NEXT SESSION: continue with endurance activities, , add proprioceptive activities as appropriate.  Early Chars, PT, DPT, OCS 11/14/2023, 12:19 PM [facility] [address] [facility] [address] [place] [place], [place], [address] Phone: [phone]   Fax:  [phone]  Patient Details  Name: Diana Bowers MRN: [id number] Date of Birth: Jul 23, 1951 Referring Provider:  Kathryne Hitch*  Encounter Date: 11/14/2023

## 2023-11-16 ENCOUNTER — Ambulatory Visit: Payer: Medicare Other

## 2023-11-21 ENCOUNTER — Ambulatory Visit: Payer: Medicare Other

## 2023-11-23 ENCOUNTER — Ambulatory Visit: Payer: Medicare Other

## 2023-12-07 ENCOUNTER — Other Ambulatory Visit: Payer: Self-pay | Admitting: Orthopaedic Surgery

## 2023-12-08 MED ORDER — TRAMADOL HCL 50 MG PO TABS: 50.0000 mg | ORAL_TABLET | Freq: Three times a day (TID) | ORAL | 0 refills | Status: DC | PRN

## 2024-03-05 ENCOUNTER — Other Ambulatory Visit: Payer: Self-pay

## 2024-03-05 ENCOUNTER — Encounter: Payer: Self-pay | Admitting: Sports Medicine

## 2024-03-05 ENCOUNTER — Other Ambulatory Visit (INDEPENDENT_AMBULATORY_CARE_PROVIDER_SITE_OTHER): Payer: Self-pay

## 2024-03-05 ENCOUNTER — Ambulatory Visit (INDEPENDENT_AMBULATORY_CARE_PROVIDER_SITE_OTHER): Payer: 59 | Admitting: Sports Medicine

## 2024-03-05 DIAGNOSIS — M25552 Pain in left hip: Secondary | ICD-10-CM

## 2024-03-05 DIAGNOSIS — M1612 Unilateral primary osteoarthritis, left hip: Secondary | ICD-10-CM

## 2024-03-05 DIAGNOSIS — E1169 Type 2 diabetes mellitus with other specified complication: Secondary | ICD-10-CM

## 2024-03-05 MED ORDER — METHYLPREDNISOLONE ACETATE 40 MG/ML IJ SUSP
80.0000 mg | INTRAMUSCULAR | Status: AC | PRN
Start: 2024-03-05 — End: 2024-03-05
  Administered 2024-03-05: 80 mg via INTRA_ARTICULAR

## 2024-03-05 MED ORDER — LIDOCAINE HCL 1 % IJ SOLN
4.0000 mL | INTRAMUSCULAR | Status: AC | PRN
Start: 2024-03-05 — End: 2024-03-05
  Administered 2024-03-05: 4 mL

## 2024-03-05 NOTE — Progress Notes (Signed)
 Diana Bowers - 73 y.o. female MRN [id number]  Date of birth: [birth year]  Office Visit Note: Visit Date: 03/05/2024 PCP: Caffie Damme, MD Referred by: Caffie Damme, MD  Subjective: Chief Complaint  Patient presents with   Left Hip - Pain   HPI: Diana Bowers is a pleasant 73 y.o. female who presents today for acute on chronic left hip pain with OA. She is s/p R-THA.  She did have a prior right hip arthroplasty with Dr. Magnus Ivan.  Over 1 year ago back on 01/27/2023 I did provide an ultrasound-guided intra-articular left hip injection which gave her good relief until here recently over the past few months that her hip pain has begun hurting again, causing pain with walking and other ADLs.  He does use tramadol 50 mg only as needed for breakthrough pain.  She is a type-II diabetic, but well-controlled.  She has had this monitored by her PCP, she is not on any medication for this.  er last A1c was: Lab Results  Component Value Date   HGBA1C 6.4 (H) 08/23/2023   Pertinent ROS were reviewed with the patient and found to be negative unless otherwise specified above in HPI.   Assessment & Plan: Visit Diagnoses:  1. Unilateral primary osteoarthritis, left hip   2. Pain in left hip   3. Type 2 diabetes mellitus with other specified complication, without long-term current use of insulin (HCC)    Plan: Impression is exacerbation of chronic left-sided osteoarthritis which is moderate to severe.  Received good relief over 1 year ago from a prior hip injection until here recently.  Through shared decision making, we did proceed with ultrasound-guided left hip intra-articular injection.  Advised on 48 hours of modified rest/activity and then may return to walking and other activity as her pain allows.  She may use ice/heat for any postinjection pain.  For her hip pain, she may continue her tramadol 50 mg only as needed for breakthrough pain.  She has very well-controlled diabetes, did advise on transient  glucose elevation.  Continue adequate hydration and good diet control.  I would like to see her back in about 6 weeks to ensure she receives good relief from the injection.  We discussed that for some reason she is not receiving adequate pain relief, she could be a candidate for hip replacement.  Follow-up: Return in about 6 weeks (around 04/16/2024) for for f/u of L-hip .   Meds & Orders: No orders of the defined types were placed in this encounter.   Orders Placed This Encounter  Procedures   Large Joint Inj: L hip joint   US Guided Needle Placement - No Linked Charges   XR HIP UNILAT W OR W/O PELVIS 2-3 VIEWS LEFT     Procedures: Large Joint Inj: L hip joint on 03/05/2024 3:37 PM Indications: pain Details: 22 G 3.5 in needle, ultrasound-guided anterior approach Medications: 4 mL lidocaine 1 %; 80 mg methylPREDNISolone acetate 40 MG/ML Outcome: tolerated well, no immediate complications  Procedure: US-guided intra-articular hip injection, left After discussion on risks/benefits/indications and informed verbal consent was obtained, a timeout was performed. Patient was lying supine on exam table. The hip was cleaned with betadine and alcohol swabs. Then utilizing ultrasound guidance, the patient's femoral head and neck junction was identified and subsequently injected with 4:2 lidocaine:depomedrol via an in-plane approach with ultrasound visualization of the injectate administered into the hip joint. Patient tolerated procedure well without immediate complications.  Procedure, treatment alternatives, risks and benefits  explained, specific risks discussed. Consent was given by the patient. Immediately prior to procedure a time out was called to verify the correct patient, procedure, equipment, support staff and site/side marked as required. Patient was prepped and draped in the usual sterile fashion.          Clinical History: No specialty comments available.  She reports that she has  never smoked. She has never used smokeless tobacco.  Recent Labs    08/23/23 1026  HGBA1C 6.4*    Objective:    Physical Exam  Gen: Well-appearing, in no acute distress; non-toxic CV: Well-perfused. Warm.  Resp: Breathing unlabored on room air; no wheezing. Psych: Fluid speech in conversation; appropriate affect; normal thought process  Ortho Exam - Left hip: There is no bony tenderness or overlying soft tissue swelling about the left hip.  There is pain with endrange internal logroll, positive FADIR and Stinchfield testing.  Imaging: XR HIP UNILAT W OR W/O PELVIS 2-3 VIEWS LEFT Result Date: 03/05/2024 2 views of the left hip including AP pelvis and left hip lateral film were ordered and reviewed by myself today.  X-rays demonstrate a slightly elongated femoral head, there is at least moderate to severe arthritic change with high-grade narrowing of the inferior medial aspect of the hip joint.  The superior lateral aspect still has cartilage surface intact.  There is slight sclerosis of the femoral head at the medial compartment with a drop osteophyte noted.  Her degree of hip arthritis has progressed slightly from her x-ray from 12/2020. Status post right hip THA in good alignment.   Past Medical/Family/Surgical/Social History: Medications & Allergies reviewed per EMR, new medications updated. 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 states migraines at times   Hypercholesteremia    Crestor   Hypertension    Family History  Problem Relation Age of Onset   Breast cancer Neg Hx    Past Surgical History:  Procedure Laterality Date   ANTERIOR CERVICAL DECOMP/DISCECTOMY FUSION  2006   CARPAL TUNNEL RELEASE  2000   "right" (01/01/2013)   CESAREAN SECTION  1978   COLONOSCOPY     ESOPHAGOGASTRODUODENOSCOPY     I & D KNEE WITH POLY EXCHANGE Left 09/15/2018   Procedure: POLY EXCHANGE LEFT KNEE,/,PATELLAR COMPONENT REVISION  STEROID INJECTION LEFT HIP;  Surgeon: Kathryne Hitch, MD;  Location: [facility];  Service: Orthopedics;  Laterality: Left;   KIDNEY DONATION  1994   KNEE ARTHROPLASTY  01/01/2013   Procedure: COMPUTER ASSISTED TOTAL KNEE ARTHROPLASTY;  Surgeon: Kerrin Champagne, MD;  [facility];  Service: Orthopedics;  Laterality: Left;  Left computer assisted total knee replacement   LIPOMA EXCISION  2000   back   REPLACEMENT TOTAL KNEE  01/01/2013   "left" (01/01/2013)   TOTAL HIP ARTHROPLASTY Right 07/22/2015   Procedure: RIGHT TOTAL HIP ARTHROPLASTY ANTERIOR APPROACH;  Surgeon: Kathryne Hitch, MD;  Location: [facility];  Service: Orthopedics;  Laterality: Right;   TOTAL KNEE ARTHROPLASTY Right 09/02/2023   Procedure: RIGHT TOTAL KNEE ARTHROPLASTY;  Surgeon: Kathryne Hitch, MD;  Location: [facility];  Service: Orthopedics;  Laterality: Right;   Social History   Occupational History   Not on file  Tobacco Use   Smoking status: Never   Smokeless tobacco: Never  Vaping Use   Vaping status: Never Used  Substance and Sexual Activity   Alcohol use: No   Drug use: No   Sexual activity: Yes

## 2024-03-09 ENCOUNTER — Other Ambulatory Visit: Payer: Self-pay | Admitting: Orthopaedic Surgery

## 2024-03-09 MED ORDER — TRAMADOL HCL 50 MG PO TABS: 50.0000 mg | ORAL_TABLET | Freq: Three times a day (TID) | ORAL | 0 refills | Status: DC | PRN

## 2024-04-16 ENCOUNTER — Ambulatory Visit (INDEPENDENT_AMBULATORY_CARE_PROVIDER_SITE_OTHER): Payer: PRIVATE HEALTH INSURANCE | Admitting: Sports Medicine

## 2024-04-16 ENCOUNTER — Encounter: Payer: Self-pay | Admitting: Sports Medicine

## 2024-04-16 DIAGNOSIS — M1612 Unilateral primary osteoarthritis, left hip: Secondary | ICD-10-CM | POA: Diagnosis not present

## 2024-04-16 NOTE — Progress Notes (Signed)
 Diana Bowers - 72 y.o. female MRN [id number]  Date of birth: Dec 20, 1951  Office Visit Note: Visit Date: 04/16/2024 PCP: Bertrum Brodie, MD Referred by: Bertrum Brodie, MD  Subjective: Chief Complaint  Patient presents with   Left Hip - Follow-up   HPI: Diana Bowers is a pleasant 72 y.o. female who presents today for follow-up of left hip osteoarthritis.  We did perform ultrasound-guided left intra-articular hip injection on 03/05/2024, Deneshia states she continues to do well and had great improvement with the injection.  Occasionally feels a twinge or small cramp in the groin but much more manageable and able to walk off.  Previously she was having pain that was waking her at night, this has resolved.  She is status post right hip THA with Dr. Lucienne Ryder.  Pertinent ROS were reviewed with the patient and found to be negative unless otherwise specified above in HPI.   Assessment & Plan: Visit Diagnoses:  1. Unilateral primary osteoarthritis, left hip    Plan: Impression is chronic left hip pain in the setting of moderate to severe osteoarthritis.  She received excellent relief of her pain and function from her ultrasound-guided intra-articular injection 6 weeks ago.  We discussed the intermittent use of injections that need to be at least 3 months apart.  She walks about 5 miles daily, she will continue this and her physical activity for mobility and range of motion.  She does have tramadol  50 mg to use only for breakthrough pain.  She may return/follow-up with me as needed.  She did receive good lasting relief from this last injection, but we did discuss that for some reason if she is not receiving adequate pain relief or prolonged relief, she could be a candidate for hip replacement down the road.  Follow-up: Return if symptoms worsen or fail to improve.   Meds & Orders: No orders of the defined types were placed in this encounter.  No orders of the defined types were placed in this  encounter.    Procedures: No procedures performed      Clinical History: No specialty comments available.  She reports that she has never smoked. She has never used smokeless tobacco.  Recent Labs    08/23/23 1026  HGBA1C 6.4*    Objective:    Physical Exam  Gen: Well-appearing, in no acute distress; non-toxic CV: Well-perfused. Warm.  Resp: Breathing unlabored on room air; no wheezing. Psych: Fluid speech in conversation; appropriate affect; normal thought process  Ortho Exam - Left hip: There is no bony TTP, no redness swelling or effusion.  There is mild restriction with internal greater than external logroll but without pain today.  Negative FADIR test.  Able to perform hip flexion without difficulty.  Imaging:  03/05/24: XR HIP UNILAT W OR W/O PELVIS 2-3 VIEWS LEFT 2 views of the left hip including AP pelvis and left hip lateral film were  ordered and reviewed by myself today.  X-rays demonstrate a slightly  elongated femoral head, there is at least moderate to severe arthritic  change with high-grade narrowing of the inferior medial aspect of the hip  joint.  The superior lateral aspect still has cartilage surface intact.   There is slight sclerosis of the femoral head at the medial compartment  with a drop osteophyte noted.  Her degree of hip arthritis has progressed  slightly from her x-ray from 12/2020. Status post right hip THA in good  alignment.  Past Medical/Family/Surgical/Social History: Medications & Allergies reviewed  per EMR, new medications updated. 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 states migraines at times   Hypercholesteremia    Crestor    Hypertension    Family History  Problem Relation Age of Onset   Breast cancer Neg Hx    Past Surgical History:  Procedure Laterality Date   ANTERIOR CERVICAL DECOMP/DISCECTOMY FUSION  2006   CARPAL TUNNEL RELEASE  2000   "right" (01/01/2013)   CESAREAN SECTION  1978   COLONOSCOPY     ESOPHAGOGASTRODUODENOSCOPY     I & D KNEE WITH POLY EXCHANGE Left 09/15/2018   Procedure: POLY EXCHANGE LEFT KNEE,/,PATELLAR COMPONENT REVISION  STEROID INJECTION LEFT HIP;  Surgeon: Arnie Lao, MD;  Location: [facility];  Service: Orthopedics;  Laterality: Left;   KIDNEY DONATION  1994   KNEE ARTHROPLASTY  01/01/2013   Procedure: COMPUTER ASSISTED TOTAL KNEE ARTHROPLASTY;  Surgeon: Alphonso Jean, MD;  Location: [facility];  Service: Orthopedics;  Laterality: Left;  Left computer assisted total knee replacement   LIPOMA EXCISION  2000   back   REPLACEMENT TOTAL KNEE  01/01/2013   "left" (01/01/2013)   TOTAL HIP ARTHROPLASTY Right 07/22/2015   Procedure: RIGHT TOTAL HIP ARTHROPLASTY ANTERIOR APPROACH;  Surgeon: Arnie Lao, MD;  Location: [facility];  Service: Orthopedics;  Laterality: Right;   TOTAL KNEE ARTHROPLASTY Right 09/02/2023   Procedure: RIGHT TOTAL KNEE ARTHROPLASTY;  Surgeon: Arnie Lao, MD;  Location: [facility];  Service: Orthopedics;  Laterality: Right;   Social History   Occupational History   Not on file   Tobacco Use   Smoking status: Never   Smokeless tobacco: Never  Vaping Use   Vaping status: Never Used  Substance and Sexual Activity   Alcohol use: No   Drug use: No   Sexual activity: Yes

## 2024-04-16 NOTE — Progress Notes (Signed)
 Patient says that her hip is feeling much better since the injection. She says that she will occasionally have a cramp in the front of the hip, but she is able to work it out when it happens. She says that otherwise she does not notice the hip anymore. She has been able to go out and walk without pain.

## 2024-05-14 ENCOUNTER — Other Ambulatory Visit (INDEPENDENT_AMBULATORY_CARE_PROVIDER_SITE_OTHER): Payer: Self-pay

## 2024-05-14 ENCOUNTER — Encounter: Payer: Self-pay | Admitting: Orthopaedic Surgery

## 2024-05-14 ENCOUNTER — Ambulatory Visit: Payer: Medicare Other | Admitting: Orthopaedic Surgery

## 2024-05-14 DIAGNOSIS — Z96651 Presence of right artificial knee joint: Secondary | ICD-10-CM

## 2024-05-14 NOTE — Progress Notes (Signed)
 The patient is a 73 year old female who is now 8 months out from a right total knee arthroplasty.  She also has a right total hip and a left total knee replacement.  She walked 5 miles a day.  She turned 73 later this month.  She says she is doing great.  We have known her for a long period of time and she does look great.  Her right knee has excellent range of motion and is ligamentously stable.  2 views of the right knee show well-seated total knee arthroplasty with no complicating features.  At this point follow-up can be as needed since she is doing so well if she has any issues she should let us  know.

## 2024-06-04 ENCOUNTER — Other Ambulatory Visit: Payer: Self-pay | Admitting: Family Medicine

## 2024-06-04 DIAGNOSIS — Z1231 Encounter for screening mammogram for malignant neoplasm of breast: Secondary | ICD-10-CM

## 2024-06-05 ENCOUNTER — Emergency Department (HOSPITAL_BASED_OUTPATIENT_CLINIC_OR_DEPARTMENT_OTHER)
Admission: EM | Admit: 2024-06-05 | Discharge: 2024-06-05 | Disposition: A | Discharge status: Home / Self Care | Attending: Emergency Medicine | Admitting: Emergency Medicine

## 2024-06-05 ENCOUNTER — Other Ambulatory Visit: Payer: Self-pay

## 2024-06-05 ENCOUNTER — Encounter (HOSPITAL_BASED_OUTPATIENT_CLINIC_OR_DEPARTMENT_OTHER): Payer: Self-pay | Admitting: Emergency Medicine

## 2024-06-05 DIAGNOSIS — Z79899 Other long term (current) drug therapy: Secondary | ICD-10-CM | POA: Insufficient documentation

## 2024-06-05 DIAGNOSIS — E119 Type 2 diabetes mellitus without complications: Secondary | ICD-10-CM | POA: Insufficient documentation

## 2024-06-05 DIAGNOSIS — M542 Cervicalgia: Secondary | ICD-10-CM | POA: Insufficient documentation

## 2024-06-05 DIAGNOSIS — I10 Essential (primary) hypertension: Secondary | ICD-10-CM | POA: Insufficient documentation

## 2024-06-05 DIAGNOSIS — M25511 Pain in right shoulder: Secondary | ICD-10-CM | POA: Diagnosis not present

## 2024-06-05 MED ORDER — HYDROCODONE-ACETAMINOPHEN 5-325 MG PO TABS: 1.0000 | ORAL_TABLET | Freq: Four times a day (QID) | ORAL | 0 refills | Status: AC | PRN

## 2024-06-05 MED ORDER — LIDOCAINE 5 % EX PTCH
1.0000 | MEDICATED_PATCH | CUTANEOUS | 0 refills | Status: AC
Start: 2024-06-05 — End: 2024-06-15

## 2024-06-05 MED ORDER — METHYLPREDNISOLONE 4 MG PO TBPK: ORAL_TABLET | ORAL | 0 refills | Status: DC

## 2024-06-05 MED ORDER — TIZANIDINE HCL 4 MG PO TABS: 4.0000 mg | ORAL_TABLET | Freq: Three times a day (TID) | ORAL | 0 refills | Status: DC | PRN

## 2024-06-05 NOTE — ED Triage Notes (Addendum)
 Right shoulder pain , woke up with this pain she said . Reports pain worse when she turns her neck to right . Was unable to see or ortho doctor until next week . No injury or fall

## 2024-06-05 NOTE — Discharge Instructions (Addendum)
 You like have a strain in your neck and/or right shoulder that is causing your symptoms. You are being discharged with a pain regimen as below and please follow up with the Orthopedic doctor as scheduled. -Lidocaine  patches to the affected area -Steroid dose pack (please follow the instruction on the bottle to taper over the next 6 days) -Tizanidine  (muscle relaxer) as needed every 8 hours. Please do not use when driving as can cause sedation. -Noroc as needed every 6 hours. Please do not use when driving as can cause sedation.

## 2024-06-05 NOTE — ED Provider Notes (Signed)
 [facility] Provider Note  CSN: [id number] Arrival date & time: 06/05/24 1214  Chief Complaint(s) Shoulder Pain  HPI Diana Bowers is a 73 y.o. female with PMHx HTN, GERD, HLD and T2DM presents with right shoulder pain.  Began suddenly when she got up on 6/8. Felt sore the entire day. Denies inciting injury or trauma. No prior surgery to that area. Also having pain in her neck that radiates down her arm. H/o neck surgery many years ago per patient. Only has one kidney. Took Tramadol  50mg  x1 for the pain. States Tylenol  does not work.  Past Medical History Past Medical History:  Diagnosis Date   Anemia    Arthritis    "left knee" (01/01/2013)   Diabetes mellitus without complication (HCC)    borderline no med   DVT of lower extremity (deep venous thrombosis) (HCC) 12/27/2005   "LLE; from birth control pill" (01/01/2013)   GERD (gastroesophageal reflux disease)    Headache    pt. 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 Authorizing Provider  HYDROcodone -acetaminophen  (NORCO/VICODIN) 5-325 MG tablet Take 1 tablet by mouth every 6 (six) hours as needed for up to 7 days for moderate pain (pain score 4-6). 06/05/24 06/12/24 Yes Jonne Netters,  MD  lidocaine  (LIDODERM ) 5 % Place 1 patch onto the skin daily for 10 days. Remove & Discard patch within 12 hours or as directed by MD 06/05/24 06/15/24 Yes Jonne Netters, MD  methylPREDNISolone  (MEDROL  DOSEPAK) 4 MG TBPK tablet Take 24mg  (6 pills) on day 1, then take 20mg  (5 pills) on day 2, then take 16mg  (4 pills) on day 3, then take 12mg  (3 pills) on day 4, then take 8mg  (2 pills) on day 5, then take 4mg  (1 pill) on day 6 06/05/24  Yes Jonne Netters, MD  tiZANidine  (ZANAFLEX ) 4 MG tablet Take 1 tablet (4 mg total) by mouth every 8 (eight) hours as needed for muscle spasms. 06/05/24  Yes Jonne Netters, MD  Cholecalciferol  (VITAMIN D -3 PO) Take 1 capsule by mouth daily.    [provider]  diltiazem  (CARDIZEM  CD) 180 MG 24 hr capsule Take 180 mg by mouth in the morning and at bedtime.    [provider]  EPINEPHrine  0.3 mg/0.3 mL IJ SOAJ injection Inject 0.3 mg into the muscle as needed for anaphylaxis. 09/27/23   Hadley Leu, NP  loteprednol  (LOTEMAX ) 0.5 % ophthalmic suspension Place 1 drop into both eyes 2 (two) times daily.    [provider]  pantoprazole  (PROTONIX ) 40 MG tablet Take 40 mg by mouth at bedtime. 08/30/18   [provider]  rosuvastatin  (CRESTOR ) 10 MG tablet Take 10 mg by mouth at bedtime.    [provider]  traMADol  (ULTRAM ) 50 MG tablet Take 1 tablet (50 mg total) by mouth every 8 (eight) hours as needed for severe pain (pain score 7-10). 03/09/24   Arnie Lao, MD                                                                                                                                    Past Surgical History Past Surgical History:  Procedure Laterality Date   ANTERIOR CERVICAL DECOMP/DISCECTOMY FUSION  2006   CARPAL TUNNEL RELEASE  2000   "right" (01/01/2013)   CESAREAN SECTION  1978   COLONOSCOPY     ESOPHAGOGASTRODUODENOSCOPY     I & D KNEE WITH POLY EXCHANGE Left 09/15/2018   Procedure: POLY EXCHANGE  LEFT KNEE,/,PATELLAR COMPONENT REVISION  STEROID INJECTION LEFT HIP;  Surgeon: Arnie Lao, MD;  Location: [facility];  Service: Orthopedics;  Laterality: Left;   KIDNEY DONATION  1994   KNEE ARTHROPLASTY  01/01/2013   Procedure: COMPUTER ASSISTED TOTAL KNEE ARTHROPLASTY;  Surgeon: Alphonso Jean, MD;  Location: [facility];  Service: Orthopedics;  Laterality: Left;  Left computer assisted total knee replacement   LIPOMA EXCISION  2000   back   REPLACEMENT TOTAL KNEE  01/01/2013   "left" (01/01/2013)   TOTAL HIP ARTHROPLASTY Right 07/22/2015   Procedure: RIGHT TOTAL HIP ARTHROPLASTY ANTERIOR APPROACH;  Surgeon: Arnie Lao, MD;  Location: [facility];  Service: Orthopedics;  Laterality: Right;   TOTAL KNEE ARTHROPLASTY Right 09/02/2023   Procedure: RIGHT TOTAL KNEE ARTHROPLASTY;  Surgeon: Arnie Lao, MD;  Location: [facility];  Service: Orthopedics;  Laterality: Right;   Family History Family History  Problem Relation Age of Onset   Breast cancer Neg Hx     Social History Social History   Tobacco Use   Smoking status: Never   Smokeless tobacco: Never  Vaping Use   Vaping status: Never Used  Substance Use Topics   Alcohol use: No   Drug use: No   Allergies Penicillins and Oxycontin  [oxycodone ]  Review of Systems A thorough review of systems was obtained and all systems are negative except as noted in the HPI and PMH.   Physical Exam Vital Signs  I have reviewed the triage vital signs BP (!) 148/83 (BP Location: Left Arm)   Pulse 68   Temp 97.6 F (36.4 C) (Oral)   Resp 17   Wt 93.4 kg   SpO2 100%   BMI 36.49 kg/m  Physical Exam General: Well-appearing, alert, NAD Eyes: Anicteric sclera ENTM: Moist mucus membranes. Cardiovascular: RRR without murmur Respiratory: CTAB. Normal WOB on RA MSK:  Back/right arm: -No erythema, ecchymosis or deformity noted -TTP over cervical spinous process and paraspinal musculature. TTP over anterior right shoulder -Positive  Spurling on R side -Full ROM of right shoulder, mild pain with significant IR -Negative Neer, Hawkins and empty can -2+ radial pulse -Sensation intact  ED Results and Treatments Labs (  all labs ordered are listed, but only abnormal results are displayed) Labs Reviewed - No data to display                                                                                                                        Radiology No results found.  Pertinent labs & imaging results that were available during my care of the patient were reviewed by me and considered in my medical decision making (see MDM for details).  Medications Ordered in ED Medications - No data to display                                                                 Medical Decision Making / ED Course    Medical Decision Making:    Diana Bowers is a 73 y.o. female with PMHx HTN, GERD, HLD and T2DM. The complaint involves an extensive differential diagnosis and also carries with it a high risk of complications and morbidity.  Serious etiology was considered. Ddx includes but is not limited to: cervical radiculopathy, rotate cuff impingement, rotator cuff tear, malpositioned cervical hardware  Complete initial physical exam performed, notably the patient was in no distress.    Reviewed and confirmed nursing documentation for past medical history, family history, social history.  Vital signs reviewed.     Brief summary:  73 year old female with history as above presenting with neck and right shoulder pain without inciting injury. Likely positional strain but discussed obtains to obtain additional imaging with the patient to evaluation for disc herniation or cervical hardware defect but patient declined. She prefers to follow up with orthopedics next week as scheduled. Discharged with pain regimen including lidocaine  patches, medrol  dose pack, Norco and muscle relaxer. Advised  Additional history obtained: -External records  from outside source obtained and reviewed including: Chart review including previous notes, labs, imaging, consultation notes including: OP Orthopedic notes   Medicines ordered and prescription drug management: Meds ordered this encounter  Medications   lidocaine  (LIDODERM ) 5 %    Sig: Place 1 patch onto the skin daily for 10 days. Remove & Discard patch within 12 hours or as directed by MD    Dispense:  10 patch    Refill:  0   tiZANidine  (ZANAFLEX ) 4 MG tablet    Sig: Take 1 tablet (4 mg total) by mouth every 8 (eight) hours as needed for muscle spasms.    Dispense:  30 tablet    Refill:  0   HYDROcodone -acetaminophen  (NORCO/VICODIN) 5-325 MG tablet    Sig: Take 1 tablet by mouth every 6 (six) hours as needed for up to 7 days for moderate pain (pain score 4-6).    Dispense:  28 tablet    Refill:  0  methylPREDNISolone  (MEDROL  DOSEPAK) 4 MG TBPK tablet    Sig: Take 24mg  (6 pills) on day 1, then take 20mg  (5 pills) on day 2, then take 16mg  (4 pills) on day 3, then take 12mg  (3 pills) on day 4, then take 8mg  (2 pills) on day 5, then take 4mg  (1 pill) on day 6    Dispense:  21 tablet    Refill:  0    -I have reviewed the patients home medicines and have made adjustments as needed   Reevaluation: After the interventions noted above, I reevaluated the patient and found that they have stayed the same  Co morbidities that complicate the patient evaluation  Past Medical History:  Diagnosis Date   Anemia    Arthritis    "left knee" (01/01/2013)   Diabetes mellitus without complication (HCC)    borderline no med   DVT of lower extremity (deep venous thrombosis) (HCC) 12/27/2005   "LLE; from birth control pill" (01/01/2013)   GERD (gastroesophageal reflux disease)    Headache    pt. states migraines at times   Hypercholesteremia    Crestor    Hypertension       Dispostion: Disposition decision including need for hospitalization was considered, and patient discharged from emergency  department.    Final Clinical Impression(s) / ED Diagnoses Final diagnoses:  Neck pain  Acute pain of right shoulder        Jonne Netters, MD 06/05/24 1405    Tegeler, Marine Sia, MD 06/05/24 1524

## 2024-06-11 ENCOUNTER — Telehealth: Payer: Self-pay

## 2024-06-11 NOTE — Telephone Encounter (Signed)
 Pharmacy Patient Advocate Encounter   Received notification from CoverMyMeds that prior authorization for LIDOCAINE  5% PATCHES is required/requested.   Insurance verification completed.   The patient is insured through [facility] .   PA required; PA submitted to above mentioned insurance via CoverMyMeds Key/confirmation #/EOC [id number]. Status is pending

## 2024-06-12 NOTE — Telephone Encounter (Signed)
 Pharmacy Patient Advocate Encounter  Received notification from [employer] that Prior Authorization for LIDOCAINE  5% PATCHES has been DENIED.  Full denial letter will be uploaded to the media tab. See denial reason below.  This drug used for PAIN IN RIGHT SHOULDER is not an approved use. Medicare Part D rules states the drug must be used for a medically-accepted indication.    LIDOCAINE  4% PATCHES AVAILABLE OTC  PA #/Case ID/Reference #: [id number]

## 2024-06-13 ENCOUNTER — Ambulatory Visit (INDEPENDENT_AMBULATORY_CARE_PROVIDER_SITE_OTHER): Admitting: Physician Assistant

## 2024-06-13 ENCOUNTER — Other Ambulatory Visit: Payer: Self-pay

## 2024-06-13 DIAGNOSIS — M25511 Pain in right shoulder: Secondary | ICD-10-CM

## 2024-06-13 MED ORDER — LIDOCAINE HCL 1 % IJ SOLN
3.0000 mL | INTRAMUSCULAR | Status: AC | PRN
  Administered 2024-06-13: 3 mL

## 2024-06-13 MED ORDER — TRAMADOL HCL 50 MG PO TABS: 50.0000 mg | ORAL_TABLET | Freq: Three times a day (TID) | ORAL | 0 refills | Status: DC | PRN

## 2024-06-13 MED ORDER — METHYLPREDNISOLONE ACETATE 40 MG/ML IJ SUSP
40.0000 mg | INTRAMUSCULAR | Status: AC | PRN
  Administered 2024-06-13: 40 mg via INTRA_ARTICULAR

## 2024-06-13 NOTE — Progress Notes (Signed)
 HPI: [name] comes in today with acute onset right shoulder pain started 2 weeks ago no known injury.  She states she went to bed 1 evening woke up the next day with shoulder pain points to the anterior aspect of the shoulder shoulder area pain.  Pain with reaching behind her.  Denies any numbness tingling down the arm.  Constant achy pain.  She was seen at [facility] ED on 610 given a 60 taper of prednisone and hydrocodone .  She states that since taking the Dosepak which she finished this past Sunday she has had decreased pain in the shoulder but still having discomfort.  No known injury.  She does use Lidoderm  patches over the shoulder also.  Review of systems: Denies any fevers chills or ongoing infections  Physical exam: General Well-developed well-nourished pleasant female in no acute distress. Psych: Alert and oriented x 3 Respirations: Unlabored Bilateral shoulders: 5 out of 5 strength with external and internal rotation against resistance.  Negative liftoff test negative.  Impingement testing is positive on the right only.  Nontender over the [facility] joint with palpation bilaterally.  Abduction is negative bilaterally.  Full overhead activity bilateral shoulders.  Radiographs: 3 views right shoulder well located.  Severe AC joint arthritic changes.  Downsloping of the acromion.  Glenohumeral joint overall well-maintained.  No acute fractures acute findings.  Impression: Right shoulder impingement  Plan: Discussed with her treatment which includes formal therapy for range of motion strengthening of shoulder, home exercises and injection subacromial right shoulder.  She would like to try home exercises and an injection.  Will see her back in 2 weeks to see how she is doing overall.  She did ask for a refill on her tramadol .  Therefore tramadol  was sent to her pharmacy.       Procedure Note  Patient: Diana Bowers             Date of Birth: 06-03-1951           MRN: [id number]              Visit Date: 06/13/2024  Procedures: Visit Diagnoses:  1. Acute pain of right shoulder     Large Joint Inj: R subacromial bursa on 06/13/2024 8:44 AM Indications: pain Details: 22 G 1.5 in needle, superior approach  Arthrogram: No  Medications: 3 mL lidocaine  1 %; 40 mg methylPREDNISolone  acetate 40 MG/ML Outcome: tolerated well, no immediate complications Procedure, treatment alternatives, risks and benefits explained, specific risks discussed. Consent was given by the patient. Immediately prior to procedure a time out was called to verify the correct patient, procedure, equipment, support staff and site/side marked as required. Patient was prepped and draped in the usual sterile fashion.

## 2024-06-15 ENCOUNTER — Telehealth: Payer: Self-pay | Admitting: Physician Assistant

## 2024-06-15 NOTE — Telephone Encounter (Signed)
 Patient called and said Diana Bowers sent in some medication for her but when she tried to get it something happen and wants to know if you could call it in again. [phone]

## 2024-06-18 ENCOUNTER — Telehealth: Payer: Self-pay | Admitting: Physician Assistant

## 2024-06-18 ENCOUNTER — Other Ambulatory Visit: Payer: Self-pay | Admitting: Physician Assistant

## 2024-06-18 MED ORDER — TRAMADOL HCL 50 MG PO TABS: 50.0000 mg | ORAL_TABLET | Freq: Three times a day (TID) | ORAL | 0 refills | Status: DC | PRN

## 2024-06-18 NOTE — Telephone Encounter (Signed)
 Left voicemail to return call if she received medication and clarify pharmacy.

## 2024-06-18 NOTE — Telephone Encounter (Signed)
 Sent request to [facility] to resend.

## 2024-06-18 NOTE — Telephone Encounter (Signed)
 Pt called stating no she did get medication yet Pharmacy states we need to call.  Pharmacy is [facility]

## 2024-06-18 NOTE — Telephone Encounter (Signed)
 Called patient

## 2024-07-04 ENCOUNTER — Ambulatory Visit: Admitting: Physician Assistant

## 2024-07-19 ENCOUNTER — Ambulatory Visit
Admission: RE | Admit: 2024-07-19 | Discharge: 2024-07-19 | Disposition: A | Discharge status: Home / Self Care | Source: Ambulatory Visit | Attending: Family Medicine | Admitting: Family Medicine

## 2024-07-19 DIAGNOSIS — Z1231 Encounter for screening mammogram for malignant neoplasm of breast: Secondary | ICD-10-CM

## 2024-07-30 ENCOUNTER — Other Ambulatory Visit: Payer: Self-pay

## 2024-07-30 ENCOUNTER — Encounter: Payer: Self-pay | Admitting: Sports Medicine

## 2024-07-30 ENCOUNTER — Ambulatory Visit (INDEPENDENT_AMBULATORY_CARE_PROVIDER_SITE_OTHER): Admitting: Sports Medicine

## 2024-07-30 DIAGNOSIS — M1612 Unilateral primary osteoarthritis, left hip: Secondary | ICD-10-CM | POA: Diagnosis not present

## 2024-07-30 DIAGNOSIS — Z96641 Presence of right artificial hip joint: Secondary | ICD-10-CM

## 2024-07-30 DIAGNOSIS — G8929 Other chronic pain: Secondary | ICD-10-CM

## 2024-07-30 DIAGNOSIS — M25552 Pain in left hip: Secondary | ICD-10-CM | POA: Diagnosis not present

## 2024-07-30 MED ORDER — LIDOCAINE HCL 1 % IJ SOLN
4.0000 mL | INTRAMUSCULAR | Status: AC | PRN
Start: 2024-07-30 — End: 2024-07-30
  Administered 2024-07-30: 4 mL

## 2024-07-30 MED ORDER — METHYLPREDNISOLONE ACETATE 40 MG/ML IJ SUSP
80.0000 mg | INTRAMUSCULAR | Status: AC | PRN
  Administered 2024-07-30: 80 mg via INTRA_ARTICULAR

## 2024-07-30 NOTE — Progress Notes (Signed)
 Diana Bowers - 73 y.o. female MRN [id number]  Date of birth: [birth year]  Office Visit Note: Visit Date: 07/30/2024 PCP: Claudene Round, MD Referred by: Claudene Round, MD  Subjective: Chief Complaint  Patient presents with   Left Hip - Pain   HPI: Diana Bowers is a pleasant 73 y.o. female who presents today for acute on chronic left hip pain with known osteoarthritis.  Back in March of this year, we did perform ultrasound-guided left hip intra-articular injection which gave her great relief for many months, until the past few weeks to months when her pain has gotten progressively worse.  Her pain feels the same in the anterior hip in the groin.  She is status post right hip total arthroplasty with Dr. Vernetta in the past, she is aware at some point she may need to proceed with this on the left hip.  She has taken tramadol  50 mg for breakthrough pain only.  She is a type-II diabetic, but well-controlled. She is diet-controlled, not on any medicine for this. Lab Results  Component Value Date   HGBA1C 6.4 (H) 08/23/2023   Pertinent ROS were reviewed with the patient and found to be negative unless otherwise specified above in HPI.   Assessment & Plan: Visit Diagnoses:  1. Unilateral primary osteoarthritis, left hip   2. Chronic left hip pain   3. Status post total replacement of right hip    Plan: Impression is acute exacerbation of chronic left hip pain with advanced osteoarthritis.  She is status post right hip total arthroplasty, but is not interested in proceeding with hip replacement at this time.  She has received excellent relief for at least 3-4 months in the past from intra-articular injection.  Through shared decision making, did proceed with left hip ultrasound-guided hip injection, tolerated well.  Advised on postinjection protocol.  She does have tramadol  50 mg that she takes for breakthrough pain, okay to use this postinjection and with long-term pain going forward.  We discussed  the infrequent nature of the injections as needed to help with physical function.  She will follow-up as needed.  Follow-up: Return if symptoms worsen or fail to improve.   Meds & Orders: No orders of the defined types were placed in this encounter.   Orders Placed This Encounter  Procedures   Large Joint Inj   US  Guided Needle Placement - No Linked Charges     Procedures: Large Joint Inj: L hip joint on 07/30/2024 10:03 AM Indications: pain Details: 22 G 3.5 in needle, ultrasound-guided anterior approach Medications: 4 mL lidocaine  1 %; 80 mg methylPREDNISolone  acetate 40 MG/ML Outcome: tolerated well, no immediate complications  Procedure: US -guided intra-articular hip injection, Left After discussion on risks/benefits/indications and informed verbal consent was obtained, a timeout was performed. Patient was lying supine on exam table. The hip was cleaned with betadine and alcohol swabs. Then utilizing ultrasound guidance, the patient's femoral head and neck junction was identified and subsequently injected with 4:2 lidocaine :depomedrol via an in-plane approach with ultrasound visualization of the injectate administered into the hip joint. Patient tolerated procedure well without immediate complications.  Procedure, treatment alternatives, risks and benefits explained, specific risks discussed. Consent was given by the patient. Immediately prior to procedure a time out was called to verify the correct patient, procedure, equipment, support staff and site/side marked as required. Patient was prepped and draped in the usual sterile fashion.          Clinical History: No specialty comments available.  She reports that she has never smoked. She has never used smokeless tobacco.  Recent Labs    08/23/23 1026  HGBA1C 6.4*    Objective:    Physical Exam  Gen: Well-appearing, in no acute distress; non-toxic CV: Well-perfused. Warm.  Resp: Breathing unlabored on room air; no  wheezing. Psych: Fluid speech in conversation; appropriate affect; normal thought process  Ortho Exam - Left hip: There is no redness swelling or effusion.  There is pain with internal and external logroll.  She has to help assist with flexion getting up onto the table for the left hip.  Positive Stinchfield test.  Imaging: No results found.  Past Medical/Family/Surgical/Social History: Medications & Allergies reviewed per EMR, new medications updated. 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 states migraines at times   Hypercholesteremia    Crestor    Hypertension    Family History  Problem Relation Age of Onset   Breast cancer Neg Hx    Past Surgical History:  Procedure Laterality Date   ANTERIOR CERVICAL DECOMP/DISCECTOMY FUSION  2006   CARPAL TUNNEL RELEASE  2000   right (01/01/2013)   CESAREAN SECTION  1978   COLONOSCOPY     ESOPHAGOGASTRODUODENOSCOPY     I & D KNEE WITH POLY EXCHANGE Left 09/15/2018   Procedure: POLY EXCHANGE LEFT KNEE,/,PATELLAR COMPONENT REVISION  STEROID INJECTION LEFT HIP;  Surgeon: Vernetta Lonni GRADE, MD;  Location: [facility];  Service: Orthopedics;  Laterality: Left;   KIDNEY DONATION  1994   KNEE ARTHROPLASTY  01/01/2013   Procedure: COMPUTER ASSISTED TOTAL KNEE ARTHROPLASTY;  Surgeon: Lynwood FORBES Better, MD;  Location: [facility];  Service: Orthopedics;  Laterality: Left;  Left computer assisted total knee replacement   LIPOMA EXCISION  2000   back   REPLACEMENT TOTAL KNEE  01/01/2013   left (01/01/2013)   TOTAL HIP ARTHROPLASTY Right 07/22/2015   Procedure: RIGHT TOTAL HIP ARTHROPLASTY ANTERIOR APPROACH;  Surgeon: Lonni GRADE Vernetta, MD;  Location: [facility];  Service: Orthopedics;  Laterality: Right;   TOTAL KNEE ARTHROPLASTY Right 09/02/2023   Procedure: RIGHT TOTAL KNEE ARTHROPLASTY;  Surgeon: Vernetta Lonni GRADE, MD;  Location: [facility];  Service: Orthopedics;  Laterality: Right;   Social History   Occupational History   Not on file  Tobacco Use   Smoking status: Never   Smokeless tobacco: Never  Vaping Use   Vaping status: Never Used  Substance and Sexual Activity   Alcohol use: No   Drug use: No   Sexual activity: Yes

## 2024-07-30 NOTE — Progress Notes (Signed)
 Patient says that she got great relief from the injection in March. Her pain began to return about 1 month ago and has gotten progressively worse since then. She says that the pain is still in the front of the hip and groin and feels similar to the pain she had prior to the injection.

## 2024-10-29 ENCOUNTER — Encounter: Payer: Self-pay | Admitting: Radiology

## 2024-11-05 ENCOUNTER — Other Ambulatory Visit: Payer: Self-pay

## 2024-11-05 ENCOUNTER — Ambulatory Visit (INDEPENDENT_AMBULATORY_CARE_PROVIDER_SITE_OTHER): Admitting: Sports Medicine

## 2024-11-05 ENCOUNTER — Encounter: Payer: Self-pay | Admitting: Sports Medicine

## 2024-11-05 DIAGNOSIS — M1612 Unilateral primary osteoarthritis, left hip: Secondary | ICD-10-CM | POA: Diagnosis not present

## 2024-11-05 DIAGNOSIS — M25552 Pain in left hip: Secondary | ICD-10-CM | POA: Diagnosis not present

## 2024-11-05 DIAGNOSIS — G8929 Other chronic pain: Secondary | ICD-10-CM

## 2024-11-05 DIAGNOSIS — E1169 Type 2 diabetes mellitus with other specified complication: Secondary | ICD-10-CM

## 2024-11-05 MED ORDER — LIDOCAINE HCL 1 % IJ SOLN
4.0000 mL | INTRAMUSCULAR | Status: AC | PRN
  Administered 2024-11-05: 4 mL

## 2024-11-05 MED ORDER — METHYLPREDNISOLONE ACETATE 40 MG/ML IJ SUSP
80.0000 mg | INTRAMUSCULAR | Status: AC | PRN
  Administered 2024-11-05: 80 mg via INTRA_ARTICULAR

## 2024-11-05 NOTE — Progress Notes (Signed)
 Diana Bowers - 73 y.o. female MRN [id number]  Date of birth: March 28, 1951  Office Visit Note: Visit Date: 11/05/2024 PCP: Claudene Round, MD Referred by: Claudene Round, MD  Subjective: Chief Complaint  Patient presents with   Left Hip - Pain   HPI: Diana Bowers is a pleasant 73 y.o. female who presents today for acute on chronic left hip pain with known OA.  Here recently the left hip has become painful again.  Back on 07/30/24 we did perform ultrasound-guided intra-articular hip injection which gave her great relief until recently.  She does follow peripherally with Dr. Vernetta as she has performed right hip arthroplasty years ago.  She states as long as the injections continue to work, she would like to continue this and hold off on surgical intervention.  This past weekend she did have to take tramadol  50 mg for breakthrough pain as she had pain at church.  She uses this infrequently only a few times a week when her pain is bad, recently has been more exacerbated.  She is a type-II diabetic, but well-controlled. She is diet-controlled, not on any medicine for this.  Lab Results  Component Value Date   HGBA1C 6.4 (H) 08/23/2023   Pertinent ROS were reviewed with the patient and found to be negative unless otherwise specified above in HPI.   Assessment & Plan: Visit Diagnoses:  1. Unilateral primary osteoarthritis, left hip   2. Chronic left hip pain   3. Type 2 diabetes mellitus with other specified complication, without long-term current use of insulin  (HCC)    - 1 chronic with exacerbation, prescription medication management  Plan: Impression is acute exacerbation of chronic left hip pain with severe osteoarthritis.  She is status post right total hip arthroplasty and bilateral knee arthroplasty, however previous injections for the left hip have been doing well and lasting months so she would like to continue pursuing this and nonsurgical treatments at this time.  Through shared decision  making, we did proceed with left hip ultrasound-guided injection, patient tolerated well.  Advised on postinjection protocol, may use ice/heat or Tylenol  for postinjection pain.  Given her degree of arthritis, she may continue her tramadol  50 mg to take for breakthrough pain only.  She will continue remaining active with walking and other activity keep the hip mobile.  As long as she continues finding benefit from the injections, did discuss the infrequent use of these injections. If at some point injections are not as helpful or are waning in affect, we could have to consider reevaluating surgical replacement with Dr. Vernetta.  She will follow-up with us  as needed.  Follow-up: Return if symptoms worsen or fail to improve.   Meds & Orders: No orders of the defined types were placed in this encounter.   Orders Placed This Encounter  Procedures   Large Joint Inj   US  Guided Needle Placement - No Linked Charges     Procedures: Large Joint Inj: L hip joint on 11/05/2024 3:10 PM Indications: pain Details: 22 G 3.5 in needle, ultrasound-guided anterior approach Medications: 4 mL lidocaine  1 %; 80 mg methylPREDNISolone  acetate 40 MG/ML Outcome: tolerated well, no immediate complications  Procedure: US -guided intra-articular hip injection, Left After discussion on risks/benefits/indications and informed verbal consent was obtained, a timeout was performed. Patient was lying supine on exam table. The hip was cleaned with betadine and alcohol swabs. Then utilizing ultrasound guidance, the patient's femoral head and neck junction was identified and subsequently injected with 4:2 lidocaine :depomedrol  via an in-plane approach with ultrasound visualization of the injectate administered into the hip joint. Patient tolerated procedure well without immediate complications.  Procedure, treatment alternatives, risks and benefits explained, specific risks discussed. Consent was given by the patient. Immediately  prior to procedure a time out was called to verify the correct patient, procedure, equipment, support staff and site/side marked as required. Patient was prepped and draped in the usual sterile fashion.          Clinical History: No specialty comments available.  She reports that she has never smoked. She has never used smokeless tobacco. No results for input(s): HGBA1C, LABURIC in the last 8760 hours.  Objective:    Physical Exam  Gen: Well-appearing, in no acute distress; non-toxic CV: Well-perfused. Warm.  Resp: Breathing unlabored on room air; no wheezing. Psych: Fluid speech in conversation; appropriate affect; normal thought process  Ortho Exam - Left hip: No redness swelling or effusion of the hip joint. There is limited range of motion with int > external logroll. + FADIR and Stinchfield testing.  Patient does have to assist with hip flexion getting the hip up onto the table.  Imaging:  03/05/24: 2 views of the left hip including AP pelvis and left hip lateral film were  ordered and reviewed by myself today.  X-rays demonstrate a slightly  elongated femoral head, there is at least moderate to severe arthritic  change with high-grade narrowing of the inferior medial aspect of the hip  joint.  The superior lateral aspect still has cartilage surface intact.   There is slight sclerosis of the femoral head at the medial compartment  with a drop osteophyte noted.  Her degree of hip arthritis has progressed  slightly from her x-ray from 12/2020. Status post right hip THA in good  alignment.   Past Medical/Family/Surgical/Social History: Medications & Allergies reviewed per EMR, new medications updated. Patient Active Problem List   Diagnosis Date Noted   OSA on CPAP 09/27/2023   Hypertension 09/26/2023   Acute pain of right knee 09/26/2023   Angio-edema 09/25/2023   S/P total knee replacement 09/03/2023   Status post total right knee replacement 09/02/2023   Polyethylene  wear of left knee joint prosthesis 09/15/2018   Status post revision of total replacement of left knee 09/15/2018   Trochanteric bursitis, left hip 08/10/2017   Osteoarthritis of right hip 07/22/2015   Status post total replacement of right hip 07/22/2015   Acute blood loss anemia 01/04/2013   Past Medical History:  Diagnosis Date   Anemia    Arthritis    left knee (01/01/2013)   Diabetes mellitus without complication (HCC)    borderline no med   DVT of lower extremity (deep venous thrombosis) (HCC) 12/27/2005   LLE; from birth control pill (01/01/2013)   GERD (gastroesophageal reflux disease)    Headache    pt. states migraines at times   Hypercholesteremia    Crestor    Hypertension    Family History  Problem Relation Age of Onset   Breast cancer Neg Hx    Past Surgical History:  Procedure Laterality Date   ANTERIOR CERVICAL DECOMP/DISCECTOMY FUSION  2006   CARPAL TUNNEL RELEASE  2000   right (01/01/2013)   CESAREAN SECTION  1978   COLONOSCOPY     ESOPHAGOGASTRODUODENOSCOPY     I & D KNEE WITH POLY EXCHANGE Left 09/15/2018   Procedure: POLY EXCHANGE LEFT KNEE,/,PATELLAR COMPONENT REVISION  STEROID INJECTION LEFT HIP;  Surgeon: Vernetta Lonni GRADE, MD;  Location:  [facility];  Service: Orthopedics;  Laterality: Left;   KIDNEY DONATION  1994   KNEE ARTHROPLASTY  01/01/2013   Procedure: COMPUTER ASSISTED TOTAL KNEE ARTHROPLASTY;  Surgeon: Lynwood FORBES Better, MD;  Location: [facility];  Service: Orthopedics;  Laterality: Left;  Left computer assisted total knee replacement   LIPOMA EXCISION  2000   back   REPLACEMENT TOTAL KNEE  01/01/2013   left (01/01/2013)   TOTAL HIP ARTHROPLASTY Right 07/22/2015   Procedure: RIGHT TOTAL HIP ARTHROPLASTY ANTERIOR APPROACH;  Surgeon: [name], MD;  Location: [facility];  Service: Orthopedics;  Laterality: Right;   TOTAL KNEE ARTHROPLASTY Right 09/02/2023   Procedure: RIGHT TOTAL KNEE ARTHROPLASTY;  Surgeon: Poli Lonni CINDERELLA, MD;  Location:  [facility];  Service: Orthopedics;  Laterality: Right;   Social History   Occupational History   Not on file  Tobacco Use   Smoking status: Never   Smokeless tobacco: Never  Vaping Use   Vaping status: Never Used  Substance and Sexual Activity   Alcohol use: No   Drug use: No   Sexual activity: Yes

## 2024-11-05 NOTE — Progress Notes (Signed)
 Patient says that she did very well with the last injection, and had relief until last week. She says that her pain feels similar to the way it felt before her last injection, and she would like to repeat that injection before the holidays.

## 2024-12-05 ENCOUNTER — Ambulatory Visit (INDEPENDENT_AMBULATORY_CARE_PROVIDER_SITE_OTHER): Admitting: Orthopaedic Surgery

## 2024-12-05 VITALS — Ht 63.0 in | Wt 221.0 lb

## 2024-12-05 DIAGNOSIS — M1612 Unilateral primary osteoarthritis, left hip: Secondary | ICD-10-CM | POA: Diagnosis not present

## 2024-12-05 DIAGNOSIS — G8929 Other chronic pain: Secondary | ICD-10-CM

## 2024-12-05 DIAGNOSIS — M25552 Pain in left hip: Secondary | ICD-10-CM | POA: Diagnosis not present

## 2024-12-05 NOTE — Progress Notes (Signed)
 The patient is well-known to us .  We have been following her now for left hip osteoarthritis for some time now.  We replaced her right hip back in 2016 and her right knee in 2024.  She has been dealing with left hip pain for over a year now.  She has had steroid injections for a while in her left hip joint with the last 1 being on November 10 of this year.  She does ambulate using a cane.  At this point her left hip pain is daily and it is detrimentally affecting her mobility, her quality of life and her actives daily living to the point she does wish to treat with a hip replacement.  She is diabetic and tries to keep her blood glucose under good control.  She is not on blood thinning medication.  On exam her left hip has severe pain with any internal and external rotation with stiffness on rotation as well.  It is quite severe.  X-rays previously of her left hip show complete loss of the joint space with bone-on-bone wear.  There are cystic changes and osteophytes around the left hip.  The right hip replacement is in good position.  We had a long and thorough discussion again about left hip replacement surgery.  Her BMI is 39 and she knows to be try to continue working on weight loss.  We will work on setting her up for surgery in early February because that will be 3 months and she has had a steroid injection in that left hip joint.  She understands this as well.  All questions and concerns were answered and addressed.  Having had joint replacement surgery before she is fully aware of the risks and benefits of surgery and what to expect from an intraoperative and postoperative standpoint.

## 2024-12-12 ENCOUNTER — Telehealth: Payer: Self-pay | Admitting: Orthopaedic Surgery

## 2024-12-12 ENCOUNTER — Other Ambulatory Visit: Payer: Self-pay | Admitting: Orthopaedic Surgery

## 2024-12-12 MED ORDER — TRAMADOL HCL 50 MG PO TABS: 50.0000 mg | ORAL_TABLET | Freq: Three times a day (TID) | ORAL | 0 refills | Status: DC | PRN

## 2024-12-12 NOTE — Telephone Encounter (Signed)
 Pt called saying that she needs a refill of Tramadol . Pharmacy is [facility] on [address] in [place]. Call back number is [phone]

## 2025-01-09 ENCOUNTER — Other Ambulatory Visit: Payer: Self-pay | Admitting: Orthopaedic Surgery

## 2025-01-10 ENCOUNTER — Other Ambulatory Visit: Payer: Self-pay | Admitting: Physician Assistant

## 2025-01-10 ENCOUNTER — Telehealth: Payer: Self-pay | Admitting: Orthopaedic Surgery

## 2025-01-10 DIAGNOSIS — Z01818 Encounter for other preprocedural examination: Secondary | ICD-10-CM

## 2025-01-10 NOTE — Telephone Encounter (Signed)
Patient called. She would like Tramadol called in for her. 

## 2025-01-16 NOTE — Patient Instructions (Addendum)
 SURGICAL WAITING ROOM VISITATION Patients having surgery or a procedure may have no more than 2 support people in the waiting area - these visitors may rotate in the visitor waiting room.   Due to an increase in RSV and influenza rates and associated hospitalizations, children ages 78 and under may not visit patients in [facility] hospitals. If the patient needs to stay at the hospital during part of their recovery, the visitor guidelines for inpatient rooms apply.  PRE-OP VISITATION  Pre-op nurse will coordinate an appropriate time for 1 support person to accompany the patient in pre-op.  This support person may not rotate.  This visitor will be contacted when the time is appropriate for the visitor to come back in the pre-op area.  Please refer to the [facility] website for the visitor guidelines for Inpatients (after your surgery is over and you are in a regular room).  You are not required to quarantine at this time prior to your surgery. However, you must do this: Hand Hygiene often Do NOT share personal items Notify your provider if you are in close contact with someone who has COVID or you develop fever 100.4 or greater, new onset of sneezing, cough, sore throat, shortness of breath or body aches.  If you test positive for Covid or have been in contact with anyone that has tested positive in the last 10 days please notify you surgeon.    Your procedure is scheduled on:  01/25/25  Report to [facility] Main Entrance: [place] entrance where the [employer] is available.   Report to admitting at: 7:15 AM  Call this number if you have any questions or problems the morning of surgery [phone]  FOLLOW ANY ADDITIONAL PRE OP INSTRUCTIONS YOU RECEIVED FROM YOUR SURGEON'S OFFICE!!!  Do not eat food after Midnight the night prior to your surgery/procedure.  After Midnight you may have the following liquids until: 6:45 AM DAY OF SURGERY  Clear Liquid Diet Water  Black  Coffee (sugar ok, NO MILK/CREAM OR CREAMERS)  Tea (sugar ok, NO MILK/CREAM OR CREAMERS) regular and decaf                             Plain Jell-O  with no fruit (NO RED)                                           Fruit ices (not with fruit pulp, NO RED)                                     Popsicles (NO RED)                                                                  Juice: NO CITRUS JUICES: only apple, WHITE grape, WHITE cranberry Sports drinks like Gatorade or Powerade (NO RED)   The day of surgery:  Drink ONE (1) Pre-Surgery Clear G2 at : 6:45 AM the morning of surgery. Drink in one sitting. Do not sip.  This drink was given  to you during your hospital pre-op appointment visit. Nothing else to drink after completing the Pre-Surgery Clear Ensure or G2 : No candy, chewing gum or throat lozenges.    Oral Hygiene is also important to reduce your risk of infection.        Remember - BRUSH YOUR TEETH THE MORNING OF SURGERY WITH YOUR REGULAR TOOTHPASTE  Do NOT smoke after Midnight the night before surgery.  STOP TAKING all Vitamins, Herbs and supplements 1 week before your surgery.   Take ONLY these medicines the morning of surgery with A SIP OF WATER : Diltiazem .Use eye drops as usual.                   You may not have any metal on your body including hair pins, jewelry, and body piercing  Do not wear make-up, lotions, powders, perfumes / cologne, or deodorant  Do not wear nail polish including gel and S&S, artificial / acrylic nails, or any other type of covering on natural nails including finger and toenails. If you have artificial nails, gel coating, etc., that needs to be removed by a nail salon, Please have this removed prior to surgery. Not doing so may mean that your surgery could be cancelled or delayed if the Surgeon or anesthesia staff feels like they are unable to monitor you safely.   Do not shave 48 hours prior to surgery to avoid nicks in your skin which may contribute to  postoperative infections.   Contacts, Hearing Aids, dentures or bridgework may not be worn into surgery. DENTURES WILL BE REMOVED PRIOR TO SURGERY PLEASE DO NOT APPLY Poly grip OR ADHESIVES!!!  You may bring a small overnight bag with you on the day of surgery, only pack items that are not valuable. [facility] IS NOT RESPONSIBLE   FOR VALUABLES THAT ARE LOST OR STOLEN.   Patients discharged on the day of surgery will not be allowed to drive home.  Someone NEEDS to stay with you for the first 24 hours after anesthesia.  Do not bring your home medications to the hospital. The Pharmacy will dispense medications listed on your medication list to you during your admission in the Hospital.  Special Instructions: Bring a copy of your healthcare power of attorney and living will documents the day of surgery, if you wish to have them scanned into your [place] [facility] Records- EPIC  Please read over the following fact sheets you were given: IF YOU HAVE QUESTIONS ABOUT YOUR PRE-OP INSTRUCTIONS, PLEASE CALL [phone]  PATIENT SIGNATURE_________________________________  NURSE SIGNATURE__________________________________  ________________________________________________________________________  Pre-operative 4 CHG Bath Instructions  DYNA-Hex 4 Chlorhexidine  Gluconate 4% Solution Antiseptic 4 fl. oz   You can play a key role in reducing the risk of infection after surgery. Your skin needs to be as free of germs as possible. You can reduce the number of germs on your skin by washing with CHG (chlorhexidine  gluconate) soap before surgery. CHG is an antiseptic soap that kills germs and continues to kill germs even after washing.   DO NOT use if you have an allergy to chlorhexidine /CHG or antibacterial soaps. If your skin becomes reddened or irritated, stop using the CHG and notify one of our RNs at   Please shower with the CHG soap starting 4 days before surgery using the following schedule:      Please keep in mind the following:  DO NOT shave, including legs and underarms, starting the day of your first shower.   You may shave your  face at any point before/day of surgery.  Place clean sheets on your bed the day you start using CHG soap. Use a clean washcloth (not used since being washed) for each shower. DO NOT sleep with pets once you start using the CHG.  CHG Shower Instructions:  If you choose to wash your hair and private area, wash first with your normal shampoo/soap.  After you use shampoo/soap, rinse your hair and body thoroughly to remove shampoo/soap residue.  Turn the water  OFF and apply about 3 tablespoons (45 ml) of CHG soap to a CLEAN washcloth.  Apply CHG soap ONLY FROM YOUR NECK DOWN TO YOUR TOES (washing for 3-5 minutes)  DO NOT use CHG soap on face, private areas, open wounds, or sores.  Pay special attention to the area where your surgery is being performed.  If you are having back surgery, having someone wash your back for you may be helpful. Wait 2 minutes after CHG soap is applied, then you may rinse off the CHG soap.  Pat dry with a clean towel  Put on clean clothes/pajamas   If you choose to wear lotion, please use ONLY the CHG-compatible lotions on the back of this paper.     Additional instructions for the day of surgery: DO NOT APPLY any lotions, deodorants, cologne, or perfumes.   Put on clean/comfortable clothes.  Brush your teeth.  Ask your nurse before applying any prescription medications to the skin.   CHG Compatible Lotions   Aveeno Moisturizing lotion  Cetaphil Moisturizing Cream  Cetaphil Moisturizing Lotion  Clairol Herbal Essence Moisturizing Lotion, Dry Skin  Clairol Herbal Essence Moisturizing Lotion, Extra Dry Skin  Clairol Herbal Essence Moisturizing Lotion, Normal Skin  Curel Age Defying Therapeutic Moisturizing Lotion with Alpha Hydroxy  Curel Extreme Care Body Lotion  Curel Soothing Hands Moisturizing Hand Lotion   Curel Therapeutic Moisturizing Cream, Fragrance-Free  Curel Therapeutic Moisturizing Lotion, Fragrance-Free  Curel Therapeutic Moisturizing Lotion, Original Formula  Eucerin Daily Replenishing Lotion  Eucerin Dry Skin Therapy Plus Alpha Hydroxy Crme  Eucerin Dry Skin Therapy Plus Alpha Hydroxy Lotion  Eucerin Original Crme  Eucerin Original Lotion  Eucerin Plus Crme Eucerin Plus Lotion  Eucerin TriLipid Replenishing Lotion  Keri Anti-Bacterial Hand Lotion  Keri Deep Conditioning Original Lotion Dry Skin Formula Softly Scented  Keri Deep Conditioning Original Lotion, Fragrance Free Sensitive Skin Formula  Keri Lotion Fast Absorbing Fragrance Free Sensitive Skin Formula  Keri Lotion Fast Absorbing Softly Scented Dry Skin Formula  Keri Original Lotion  Keri Skin Renewal Lotion Keri Silky Smooth Lotion  Keri Silky Smooth Sensitive Skin Lotion  Nivea Body Creamy Conditioning Oil  Nivea Body Extra Enriched Lotion  Nivea Body Original Lotion  Nivea Body Sheer Moisturizing Lotion Nivea Crme  Nivea Skin Firming Lotion  NutraDerm 30 Skin Lotion  NutraDerm Skin Lotion  NutraDerm Therapeutic Skin Cream  NutraDerm Therapeutic Skin Lotion  ProShield Protective Hand Cream  Provon moisturizing lotion  Incentive Spirometer  An incentive spirometer is a tool that can help keep your lungs clear and active. This tool measures how well you are filling your lungs with each breath. Taking long deep breaths may help reverse or decrease the chance of developing breathing (pulmonary) problems (especially infection) following: A long period of time when you are unable to move or be active. BEFORE THE PROCEDURE  If the spirometer includes an indicator to show your best effort, your nurse or respiratory therapist will set it to a desired goal. If possible, sit up straight  or lean slightly forward. Try not to slouch. Hold the incentive spirometer in an upright position. INSTRUCTIONS FOR USE  Sit on the  edge of your bed if possible, or sit up as far as you can in bed or on a chair. Hold the incentive spirometer in an upright position. Breathe out normally. Place the mouthpiece in your mouth and seal your lips tightly around it. Breathe in slowly and as deeply as possible, raising the piston or the ball toward the top of the column. Hold your breath for 3-5 seconds or for as long as possible. Allow the piston or ball to fall to the bottom of the column. Remove the mouthpiece from your mouth and breathe out normally. Rest for a few seconds and repeat Steps 1 through 7 at least 10 times every 1-2 hours when you are awake. Take your time and take a few normal breaths between deep breaths. The spirometer may include an indicator to show your best effort. Use the indicator as a goal to work toward during each repetition. After each set of 10 deep breaths, practice coughing to be sure your lungs are clear. If you have an incision (the cut made at the time of surgery), support your incision when coughing by placing a pillow or rolled up towels firmly against it. Once you are able to get out of bed, walk around indoors and cough well. You may stop using the incentive spirometer when instructed by your caregiver.  RISKS AND COMPLICATIONS Take your time so you do not get dizzy or light-headed. If you are in pain, you may need to take or ask for pain medication before doing incentive spirometry. It is harder to take a deep breath if you are having pain. AFTER USE Rest and breathe slowly and easily. It can be helpful to keep track of a log of your progress. Your caregiver can provide you with a simple table to help with this. If you are using the spirometer at home, follow these instructions: SEEK MEDICAL CARE IF:  You are having difficultly using the spirometer. You have trouble using the spirometer as often as instructed. Your pain medication is not giving enough relief while using the spirometer. You  develop fever of 100.5 F (38.1 C) or higher. SEEK IMMEDIATE MEDICAL CARE IF:  You cough up bloody sputum that had not been present before. You develop fever of 102 F (38.9 C) or greater. You develop worsening pain at or near the incision site. MAKE SURE YOU:  Understand these instructions. Will watch your condition. Will get help right away if you are not doing well or get worse. Document Released: 04/25/2007 Document Revised: 03/06/2012 Document Reviewed: 06/26/2007 [facility] Patient Information 2014 [place], [place].   ________________________________________________________________________

## 2025-01-17 ENCOUNTER — Encounter (HOSPITAL_COMMUNITY): Payer: Self-pay

## 2025-01-17 ENCOUNTER — Other Ambulatory Visit: Payer: Self-pay

## 2025-01-17 ENCOUNTER — Encounter (HOSPITAL_COMMUNITY)
Admission: RE | Admit: 2025-01-17 | Discharge: 2025-01-17 | Disposition: A | Discharge status: Home / Self Care | Source: Ambulatory Visit | Attending: Orthopaedic Surgery | Admitting: Orthopaedic Surgery

## 2025-01-17 VITALS — BP 161/77 | HR 61 | Temp 98.8°F | Ht 63.0 in | Wt 218.0 lb

## 2025-01-17 DIAGNOSIS — E119 Type 2 diabetes mellitus without complications: Secondary | ICD-10-CM | POA: Diagnosis not present

## 2025-01-17 DIAGNOSIS — I10 Essential (primary) hypertension: Secondary | ICD-10-CM | POA: Diagnosis not present

## 2025-01-17 DIAGNOSIS — I491 Atrial premature depolarization: Secondary | ICD-10-CM | POA: Insufficient documentation

## 2025-01-17 DIAGNOSIS — Z01818 Encounter for other preprocedural examination: Secondary | ICD-10-CM | POA: Diagnosis not present

## 2025-01-17 DIAGNOSIS — R001 Bradycardia, unspecified: Secondary | ICD-10-CM | POA: Diagnosis not present

## 2025-01-17 LAB — GLUCOSE, CAPILLARY: Glucose-Capillary: 107 mg/dL — ABNORMAL HIGH (ref 70–99)

## 2025-01-17 LAB — CBC
HCT: 42.6 % (ref 36.0–46.0)
Hemoglobin: 12.9 g/dL (ref 12.0–15.0)
MCH: 25.4 pg — ABNORMAL LOW (ref 26.0–34.0)
MCHC: 30.3 g/dL (ref 30.0–36.0)
MCV: 83.9 fL (ref 80.0–100.0)
Platelets: 203 K/uL (ref 150–400)
RBC: 5.08 MIL/uL (ref 3.87–5.11)
RDW: 13.7 % (ref 11.5–15.5)
WBC: 8.2 K/uL (ref 4.0–10.5)
nRBC: 0 % (ref 0.0–0.2)

## 2025-01-17 LAB — BASIC METABOLIC PANEL WITH GFR
Anion gap: 9 (ref 5–15)
BUN: 18 mg/dL (ref 8–23)
CO2: 27 mmol/L (ref 22–32)
Calcium: 10.3 mg/dL (ref 8.9–10.3)
Chloride: 105 mmol/L (ref 98–111)
Creatinine, Ser: 0.92 mg/dL (ref 0.44–1.00)
GFR, Estimated: >60 mL/min
Glucose, Bld: 95 mg/dL (ref 70–99)
Potassium: 4.6 mmol/L (ref 3.5–5.1)
Sodium: 141 mmol/L (ref 135–145)

## 2025-01-17 LAB — HEMOGLOBIN A1C
Hgb A1c MFr Bld: 6.1 % — ABNORMAL HIGH (ref 4.8–5.6)
Mean Plasma Glucose: 128.37 mg/dL

## 2025-01-17 LAB — SURGICAL PCR SCREEN
MRSA, PCR: NEGATIVE
Staphylococcus aureus: NEGATIVE

## 2025-01-17 NOTE — Progress Notes (Signed)
 For Anesthesia: PCP - Claudene Round, MD  Cardiologist - N/A  Bowel Prep reminder:  Chest x-ray -  EKG - 01/17/25 Stress Test -  ECHO -  Cardiac Cath -  Pacemaker/ICD device last checked: Pacemaker orders received: Device Rep notified:  Spinal Cord Stimulator: N/A  Sleep Study - Yes CPAP - Yes  Fasting Blood Sugar - N/A Checks Blood Sugar ___0__ times a day Date and result of last Hgb A1c-  Last dose of GLP1 agonist- N/A GLP1 instructions: Hold 7 days prior to schedule (Hold 24 hours-daily)   Last dose of SGLT-2 inhibitors- N/A SGLT-2 instructions: Hold 72 hours prior to surgery  Blood Thinner Instructions:N/A Last Dose: Time last taken:  Aspirin  Instructions:N/A Last Dose: Time last taken:  Activity level: Can go up a flight of stairs and activities of daily living without stopping and without chest pain and/or shortness of breath   Able to exercise without chest pain and/or shortness of breath  Anesthesia review: Hx: HTN,DVT,DIA,OSA(CPAP).  Patient denies shortness of breath, fever, cough and chest pain at PAT appointment   Patient verbalized understanding of instructions that were reviewed over the telephone.

## 2025-01-24 NOTE — H&P (Signed)
 TOTAL HIP ADMISSION H&P  Patient is admitted for left total hip arthroplasty.  Subjective:  Chief Complaint: left hip pain  HPI: Diana Bowers, 74 y.o. female, has a history of pain and functional disability in the left hip(s) due to arthritis and patient has failed non-surgical conservative treatments for greater than 12 weeks to include NSAID's and/or analgesics, corticosteriod injections, use of assistive devices, weight reduction as appropriate, and activity modification.  Onset of symptoms was gradual starting a few years ago with gradually worsening course since that time.The patient noted no past surgery on the left hip(s).  Patient currently rates pain in the left hip at 10 out of 10 with activity. Patient has night pain, worsening of pain with activity and weight bearing, pain that interfers with activities of daily living, and pain with passive range of motion. Patient has evidence of subchondral sclerosis, periarticular osteophytes, and joint space narrowing by imaging studies. This condition presents safety issues increasing the risk of falls.  There is no current active infection.  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 states migraines at times   Hypercholesteremia    Crestor    Hypertension     Past Surgical History:  Procedure Laterality Date   ANTERIOR CERVICAL DECOMP/DISCECTOMY FUSION  2006   CARPAL TUNNEL RELEASE  2000   right (01/01/2013)   CESAREAN SECTION  1978   COLONOSCOPY     ESOPHAGOGASTRODUODENOSCOPY     I & D KNEE WITH POLY EXCHANGE Left 09/15/2018   Procedure: POLY EXCHANGE LEFT KNEE,/,PATELLAR COMPONENT REVISION  STEROID INJECTION LEFT HIP;  Surgeon: Vernetta Lonni GRADE, MD;  Location: [facility];  Service: Orthopedics;  Laterality: Left;   KIDNEY DONATION  1994   KNEE ARTHROPLASTY  01/01/2013   Procedure: COMPUTER ASSISTED TOTAL KNEE ARTHROPLASTY;  Surgeon: Lynwood FORBES Better, MD;  Location: [facility] OR;  Service: Orthopedics;  Laterality: Left;  Left computer assisted total knee replacement   LIPOMA EXCISION  2000   back   REPLACEMENT TOTAL KNEE  01/01/2013   left (01/01/2013)   TOTAL HIP ARTHROPLASTY Right 07/22/2015   Procedure: RIGHT TOTAL HIP ARTHROPLASTY ANTERIOR APPROACH;  Surgeon: Lonni GRADE Vernetta, MD;  Location: [facility] OR;  Service: Orthopedics;  Laterality: Right;   TOTAL KNEE ARTHROPLASTY Right 09/02/2023   Procedure: RIGHT TOTAL KNEE ARTHROPLASTY;  Surgeon: Vernetta Lonni GRADE, MD;  Location: [facility];  Service: Orthopedics;  Laterality: Right;    No current facility-administered medications for this encounter.   Current Outpatient Medications  Medication Sig Dispense Refill Last  Dose/Taking   Cholecalciferol  (VITAMIN D -3 PO) Take 1 capsule by mouth daily.   Taking   diltiazem  (CARDIZEM  CD) 180 MG 24 hr capsule Take 180 mg by mouth in the morning and at bedtime.   Taking   EPINEPHrine  0.3 mg/0.3 mL IJ SOAJ injection Inject 0.3 mg into the muscle as needed for anaphylaxis. 1 each 2 Taking As Needed   loteprednol  (LOTEMAX ) 0.5 % ophthalmic  suspension Place 1 drop into both eyes 2 (two) times daily.   Taking   pantoprazole  (PROTONIX ) 40 MG tablet Take 40 mg by mouth at bedtime.  3 Taking   rosuvastatin  (CRESTOR ) 10 MG tablet Take 10 mg by mouth at bedtime.   Taking   traMADol  (ULTRAM ) 50 MG tablet Take 1 tablet (50 mg total) by mouth every 8 (eight) hours as needed for severe pain (pain score 7-10). 30 tablet 0 Taking As Needed   Allergies[1]  Social History   Tobacco Use   Smoking status: Never   Smokeless tobacco: Never  Substance Use Topics   Alcohol use: No    Family History  Problem Relation Age of Onset   Breast cancer Neg Hx      Review of Systems  Objective:  Physical Exam Vitals reviewed.  Constitutional:      Appearance: Normal appearance.  HENT:     Head: Normocephalic and atraumatic.  Eyes:     Extraocular Movements: Extraocular movements intact.     Pupils: Pupils are equal, round, and reactive to light.  Cardiovascular:     Rate and Rhythm: Normal rate and regular rhythm.  Pulmonary:     Effort: Pulmonary effort is normal.     Breath sounds: Normal breath sounds.  Abdominal:     Palpations: Abdomen is soft.  Musculoskeletal:     Cervical back: Normal range of motion and neck supple.     Left hip: Tenderness and bony tenderness present. Decreased range of motion. Decreased strength.  Neurological:     Mental Status: She is alert and oriented to person, place, and time.  Psychiatric:        Behavior: Behavior normal.     Vital signs in last 24 hours:    Labs:   Estimated body mass index is 38.62 kg/m as calculated from the following:   Height as of 01/17/25: 5' 3 (1.6 m).   Weight as of 01/17/25: 98.9 kg.   Imaging Review Plain radiographs demonstrate severe degenerative joint disease of the left hip(s). The bone quality appears to be good for age and reported activity level.      Assessment/Plan:  End stage arthritis, left hip(s)  The patient history, physical  examination, clinical judgement of the provider and imaging studies are consistent with end stage degenerative joint disease of the left hip(s) and total hip arthroplasty is deemed medically necessary. The treatment options including medical management, injection therapy, arthroscopy and arthroplasty were discussed at length. The risks and benefits of total hip arthroplasty were presented and reviewed. The risks due to aseptic loosening, infection, stiffness, dislocation/subluxation,  thromboembolic complications and other imponderables were discussed.  The patient acknowledged the explanation, agreed to proceed with the plan and consent was signed. Patient is being admitted for inpatient treatment for surgery, pain control, PT, OT, prophylactic antibiotics, VTE prophylaxis, progressive ambulation and ADL's and discharge planning.The patient is planning to be discharged home with home health services       [1]  Allergies Allergen Reactions   No [employer] Allergies Swelling  HONEY-tongue swelling (patient indicates reaction cause hospitalization)   Penicillins Hives   Oxycontin  [Oxycodone ] Nausea And Vomiting and Other (See Comments)    Stomach upset

## 2025-01-25 ENCOUNTER — Observation Stay (HOSPITAL_COMMUNITY)

## 2025-01-25 ENCOUNTER — Observation Stay (HOSPITAL_COMMUNITY)
Admission: RE | Admit: 2025-01-25 | Discharge: 2025-01-28 | Disposition: A | Attending: Orthopaedic Surgery | Admitting: Orthopaedic Surgery

## 2025-01-25 ENCOUNTER — Ambulatory Visit (HOSPITAL_COMMUNITY)

## 2025-01-25 ENCOUNTER — Encounter (HOSPITAL_COMMUNITY): Payer: Self-pay | Admitting: Orthopaedic Surgery

## 2025-01-25 ENCOUNTER — Ambulatory Visit (HOSPITAL_COMMUNITY): Payer: Self-pay | Admitting: Anesthesiology

## 2025-01-25 ENCOUNTER — Encounter (HOSPITAL_COMMUNITY): Admission: RE | Payer: Self-pay | Source: Home / Self Care

## 2025-01-25 ENCOUNTER — Other Ambulatory Visit: Payer: Self-pay

## 2025-01-25 DIAGNOSIS — G4733 Obstructive sleep apnea (adult) (pediatric): Secondary | ICD-10-CM

## 2025-01-25 DIAGNOSIS — M1612 Unilateral primary osteoarthritis, left hip: Principal | ICD-10-CM | POA: Diagnosis present

## 2025-01-25 DIAGNOSIS — I10 Essential (primary) hypertension: Secondary | ICD-10-CM

## 2025-01-25 DIAGNOSIS — E119 Type 2 diabetes mellitus without complications: Secondary | ICD-10-CM | POA: Diagnosis not present

## 2025-01-25 DIAGNOSIS — Z79899 Other long term (current) drug therapy: Secondary | ICD-10-CM | POA: Insufficient documentation

## 2025-01-25 DIAGNOSIS — Z96642 Presence of left artificial hip joint: Secondary | ICD-10-CM

## 2025-01-25 LAB — GLUCOSE, CAPILLARY
Glucose-Capillary: 86 mg/dL (ref 70–99)
Glucose-Capillary: 74 mg/dL (ref 70–99)

## 2025-01-25 LAB — TYPE AND SCREEN
ABO/RH(D): O POS
Antibody Screen: NEGATIVE

## 2025-01-25 MED ORDER — ACETAMINOPHEN 325 MG PO TABS: 325.0000 mg | ORAL_TABLET | Freq: Four times a day (QID) | ORAL | Status: DC | PRN

## 2025-01-25 MED ORDER — PHENYLEPHRINE HCL-NACL 20-0.9 MG/250ML-% IV SOLN
INTRAVENOUS | Status: DC | PRN
  Administered 2025-01-25: 25 ug/min via INTRAVENOUS

## 2025-01-25 MED ORDER — CHLORHEXIDINE GLUCONATE 0.12 % MT SOLN
15.0000 mL | Freq: Once | OROMUCOSAL | Status: AC
  Administered 2025-01-25: 15 mL via OROMUCOSAL

## 2025-01-25 MED ORDER — METOCLOPRAMIDE HCL 5 MG/ML IJ SOLN: 5.0000 mg | Freq: Three times a day (TID) | INTRAMUSCULAR | Status: DC | PRN

## 2025-01-25 MED ORDER — ONDANSETRON HCL 4 MG/2ML IJ SOLN
INTRAMUSCULAR | Status: DC | PRN
  Administered 2025-01-25: 4 mg via INTRAVENOUS

## 2025-01-25 MED ORDER — LOTEPREDNOL ETABONATE 0.5 % OP SUSP
1.0000 [drp] | Freq: Two times a day (BID) | OPHTHALMIC | Status: DC
  Administered 2025-01-25 – 2025-01-28 (×6): 1 [drp] via OPHTHALMIC
  Filled 2025-01-25: qty 5

## 2025-01-25 MED ORDER — STERILE WATER FOR IRRIGATION IR SOLN
Status: DC | PRN
  Administered 2025-01-25: 2000 mL

## 2025-01-25 MED ORDER — LACTATED RINGERS IV SOLN: INTRAVENOUS | Status: DC

## 2025-01-25 MED ORDER — ONDANSETRON HCL 4 MG/2ML IJ SOLN
INTRAMUSCULAR | Status: AC
  Filled 2025-01-25: qty 2

## 2025-01-25 MED ORDER — METHOCARBAMOL 500 MG PO TABS
ORAL_TABLET | ORAL | Status: AC
  Filled 2025-01-25: qty 1

## 2025-01-25 MED ORDER — METHOCARBAMOL 1000 MG/10ML IJ SOLN: 500.0000 mg | Freq: Four times a day (QID) | INTRAMUSCULAR | Status: DC | PRN

## 2025-01-25 MED ORDER — ONDANSETRON HCL 4 MG/2ML IJ SOLN: 4.0000 mg | Freq: Four times a day (QID) | INTRAMUSCULAR | Status: DC | PRN

## 2025-01-25 MED ORDER — MORPHINE SULFATE (PF) 2 MG/ML IV SOLN
0.5000 mg | INTRAVENOUS | Status: DC | PRN
  Administered 2025-01-25 – 2025-01-26 (×3): 1 mg via INTRAVENOUS
  Filled 2025-01-25 (×2): qty 1

## 2025-01-25 MED ORDER — OXYCODONE HCL 5 MG PO TABS
5.0000 mg | ORAL_TABLET | Freq: Once | ORAL | Status: AC | PRN
  Administered 2025-01-25: 5 mg via ORAL

## 2025-01-25 MED ORDER — ASPIRIN 81 MG PO CHEW
81.0000 mg | CHEWABLE_TABLET | Freq: Two times a day (BID) | ORAL | Status: DC
  Administered 2025-01-25 – 2025-01-28 (×6): 81 mg via ORAL
  Filled 2025-01-25 (×6): qty 1

## 2025-01-25 MED ORDER — OXYCODONE HCL 5 MG PO TABS
ORAL_TABLET | ORAL | Status: AC
  Filled 2025-01-25: qty 1

## 2025-01-25 MED ORDER — ONDANSETRON HCL 4 MG PO TABS: 4.0000 mg | ORAL_TABLET | Freq: Four times a day (QID) | ORAL | Status: DC | PRN

## 2025-01-25 MED ORDER — INSULIN ASPART 100 UNIT/ML IJ SOLN: 0.0000 [IU] | INTRAMUSCULAR | Status: DC | PRN

## 2025-01-25 MED ORDER — METHOCARBAMOL 500 MG PO TABS
500.0000 mg | ORAL_TABLET | Freq: Four times a day (QID) | ORAL | Status: DC | PRN
  Administered 2025-01-25 – 2025-01-26 (×3): 500 mg via ORAL
  Filled 2025-01-25 (×2): qty 1

## 2025-01-25 MED ORDER — MORPHINE SULFATE (PF) 2 MG/ML IV SOLN
INTRAVENOUS | Status: AC
  Filled 2025-01-25: qty 1

## 2025-01-25 MED ORDER — POLYETHYLENE GLYCOL 3350 17 G PO PACK
17.0000 g | PACK | Freq: Every day | ORAL | Status: DC | PRN
  Administered 2025-01-27: 17 g via ORAL
  Filled 2025-01-25: qty 1

## 2025-01-25 MED ORDER — HYDROCODONE-ACETAMINOPHEN 7.5-325 MG PO TABS
1.0000 | ORAL_TABLET | ORAL | Status: DC | PRN
  Administered 2025-01-25: 1 via ORAL
  Administered 2025-01-25: 2 via ORAL
  Administered 2025-01-25 – 2025-01-26 (×2): 1 via ORAL
  Administered 2025-01-26: 2 via ORAL
  Administered 2025-01-26 – 2025-01-28 (×4): 1 via ORAL
  Filled 2025-01-25 (×4): qty 1
  Filled 2025-01-25: qty 2
  Filled 2025-01-25 (×2): qty 1
  Filled 2025-01-25: qty 2

## 2025-01-25 MED ORDER — PHENOL 1.4 % MT LIQD: 1.0000 | OROMUCOSAL | Status: DC | PRN

## 2025-01-25 MED ORDER — PROPOFOL 500 MG/50ML IV EMUL
INTRAVENOUS | Status: DC | PRN
  Administered 2025-01-25: 100 ug/kg/min via INTRAVENOUS

## 2025-01-25 MED ORDER — METOCLOPRAMIDE HCL 5 MG PO TABS: 5.0000 mg | ORAL_TABLET | Freq: Three times a day (TID) | ORAL | Status: DC | PRN

## 2025-01-25 MED ORDER — HYDROCODONE-ACETAMINOPHEN 5-325 MG PO TABS
1.0000 | ORAL_TABLET | ORAL | Status: DC | PRN
  Administered 2025-01-26 – 2025-01-27 (×3): 1 via ORAL
  Administered 2025-01-27: 2 via ORAL
  Filled 2025-01-25 (×2): qty 1
  Filled 2025-01-25: qty 2
  Filled 2025-01-25: qty 1

## 2025-01-25 MED ORDER — EPHEDRINE 5 MG/ML INJ
INTRAVENOUS | Status: AC
  Filled 2025-01-25: qty 5

## 2025-01-25 MED ORDER — MENTHOL 3 MG MT LOZG: 1.0000 | LOZENGE | OROMUCOSAL | Status: DC | PRN

## 2025-01-25 MED ORDER — POVIDONE-IODINE 10 % EX SWAB: 2.0000 | Freq: Once | CUTANEOUS | Status: DC

## 2025-01-25 MED ORDER — PROPOFOL 10 MG/ML IV BOLUS
INTRAVENOUS | Status: DC | PRN
  Administered 2025-01-25 (×2): 10 mg via INTRAVENOUS

## 2025-01-25 MED ORDER — PANTOPRAZOLE SODIUM 40 MG PO TBEC
40.0000 mg | DELAYED_RELEASE_TABLET | Freq: Every day | ORAL | Status: DC
  Administered 2025-01-25 – 2025-01-27 (×3): 40 mg via ORAL
  Filled 2025-01-25 (×3): qty 1

## 2025-01-25 MED ORDER — CEFAZOLIN SODIUM-DEXTROSE 2-4 GM/100ML-% IV SOLN
2.0000 g | Freq: Four times a day (QID) | INTRAVENOUS | Status: AC
  Administered 2025-01-25 (×2): 2 g via INTRAVENOUS
  Filled 2025-01-25 (×2): qty 100

## 2025-01-25 MED ORDER — MEPERIDINE HCL 25 MG/ML IJ SOLN: 6.2500 mg | INTRAMUSCULAR | Status: DC | PRN

## 2025-01-25 MED ORDER — HYDROMORPHONE HCL 1 MG/ML IJ SOLN
0.2500 mg | INTRAMUSCULAR | Status: DC | PRN
  Administered 2025-01-25 (×4): 0.5 mg via INTRAVENOUS

## 2025-01-25 MED ORDER — SODIUM CHLORIDE 0.9 % IV SOLN: INTRAVENOUS | Status: DC

## 2025-01-25 MED ORDER — OXYCODONE HCL 5 MG/5ML PO SOLN: 5.0000 mg | Freq: Once | ORAL | Status: AC | PRN

## 2025-01-25 MED ORDER — HYDROCODONE-ACETAMINOPHEN 7.5-325 MG PO TABS
ORAL_TABLET | ORAL | Status: AC
  Filled 2025-01-25: qty 1

## 2025-01-25 MED ORDER — ALUM & MAG HYDROXIDE-SIMETH 200-200-20 MG/5ML PO SUSP: 30.0000 mL | ORAL | Status: DC | PRN

## 2025-01-25 MED ORDER — PROPOFOL 1000 MG/100ML IV EMUL
INTRAVENOUS | Status: AC
  Filled 2025-01-25: qty 100

## 2025-01-25 MED ORDER — DROPERIDOL 2.5 MG/ML IJ SOLN: 0.6250 mg | Freq: Once | INTRAMUSCULAR | Status: DC | PRN

## 2025-01-25 MED ORDER — ORAL CARE MOUTH RINSE: 15.0000 mL | Freq: Once | OROMUCOSAL | Status: AC

## 2025-01-25 MED ORDER — ACETAMINOPHEN 500 MG PO TABS
1000.0000 mg | ORAL_TABLET | Freq: Once | ORAL | Status: AC
  Administered 2025-01-25: 1000 mg via ORAL

## 2025-01-25 MED ORDER — 0.9 % SODIUM CHLORIDE (POUR BTL) OPTIME
TOPICAL | Status: DC | PRN
  Administered 2025-01-25: 1000 mL

## 2025-01-25 MED ORDER — HYDROMORPHONE HCL 1 MG/ML IJ SOLN
INTRAMUSCULAR | Status: AC
  Filled 2025-01-25: qty 1

## 2025-01-25 MED ORDER — CEFAZOLIN SODIUM-DEXTROSE 2-4 GM/100ML-% IV SOLN
2.0000 g | INTRAVENOUS | Status: AC
  Administered 2025-01-25: 2 g via INTRAVENOUS
  Filled 2025-01-25: qty 100

## 2025-01-25 MED ORDER — DIPHENHYDRAMINE HCL 12.5 MG/5ML PO ELIX: 12.5000 mg | ORAL_SOLUTION | ORAL | Status: DC | PRN

## 2025-01-25 MED ORDER — SENNA 8.6 MG PO TABS
1.0000 | ORAL_TABLET | Freq: Every day | ORAL | Status: DC
  Administered 2025-01-25 – 2025-01-28 (×4): 8.6 mg via ORAL
  Filled 2025-01-25 (×4): qty 1

## 2025-01-25 MED ORDER — SODIUM CHLORIDE 0.9 % IR SOLN
Status: DC | PRN
  Administered 2025-01-25: 1000 mL

## 2025-01-25 MED ORDER — ACETAMINOPHEN 10 MG/ML IV SOLN: 1000.0000 mg | Freq: Once | INTRAVENOUS | Status: DC | PRN

## 2025-01-25 MED ORDER — DILTIAZEM HCL ER COATED BEADS 180 MG PO CP24
180.0000 mg | ORAL_CAPSULE | Freq: Two times a day (BID) | ORAL | Status: DC
  Administered 2025-01-25 – 2025-01-28 (×6): 180 mg via ORAL
  Filled 2025-01-25 (×6): qty 1

## 2025-01-25 MED ORDER — TRANEXAMIC ACID-NACL 1000-0.7 MG/100ML-% IV SOLN
1000.0000 mg | INTRAVENOUS | Status: AC
  Administered 2025-01-25: 1000 mg via INTRAVENOUS
  Filled 2025-01-25: qty 100

## 2025-01-25 MED ORDER — EPHEDRINE SULFATE (PRESSORS) 25 MG/5ML IV SOSY
PREFILLED_SYRINGE | INTRAVENOUS | Status: DC | PRN
  Administered 2025-01-25: 5 mg via INTRAVENOUS

## 2025-01-25 NOTE — Plan of Care (Signed)
" °  Problem: Education: Goal: Knowledge of the prescribed therapeutic regimen will improve Outcome: Progressing   Problem: Bowel/Gastric: Goal: Gastrointestinal status for postoperative course will improve Outcome: Progressing   Problem: Cardiac: Goal: Ability to maintain an adequate cardiac output Outcome: Progressing   "

## 2025-01-25 NOTE — Interval H&P Note (Signed)
 History and Physical Interval Note: The patient understands that she is here today for a left total hip replacement to treat significant left hip pain and arthritis.  There has been no acute or interval change in her medical status.  The risks and benefits of surgery have been discussed in detail and informed consent has been obtained.  The left operative hip has been marked.  01/25/2025 8:37 AM  Diana Bowers  has presented today for surgery, with the diagnosis of osteoarthritis left hip.  The various methods of treatment have been discussed with the patient and family. After consideration of risks, benefits and other options for treatment, the patient has consented to  Procedures: ARTHROPLASTY, HIP, TOTAL, ANTERIOR APPROACH (Left) as a surgical intervention.  The patient's history has been reviewed, patient examined, no change in status, stable for surgery.  I have reviewed the patient's chart and labs.  Questions were answered to the patient's satisfaction.     [name]

## 2025-01-25 NOTE — Transfer of Care (Signed)
 Immediate Anesthesia Transfer of Care Note  Patient: Diana Bowers  Procedure(s) Performed: ARTHROPLASTY, HIP, TOTAL, ANTERIOR APPROACH (Left: Hip)  Patient Location: PACU  Anesthesia Type:MAC and Spinal  Level of Consciousness: awake  Airway & Oxygen Therapy: Patient Spontanous Breathing and Patient connected to face mask oxygen  Post-op Assessment: Report given to RN and Post -op Vital signs reviewed and stable  Post vital signs: Reviewed and stable  Last Vitals:  Vitals Value Taken Time  BP 124/62 01/25/25 11:55  Temp    Pulse 52 01/25/25 11:58  Resp 12 01/25/25 11:58  SpO2 100 % 01/25/25 11:58  Vitals shown include unfiled device data.  Last Pain:  Vitals:   01/25/25 0826  TempSrc:   PainSc: 5          Complications: No notable events documented.

## 2025-01-25 NOTE — Anesthesia Preprocedure Evaluation (Addendum)
 "                                  Anesthesia Evaluation  Patient identified by MRN, date of birth, ID band Patient awake    Reviewed: Allergy & Precautions, NPO status , Patient's Chart, lab work & pertinent test results  Airway Mallampati: II       Dental no notable dental hx.    Pulmonary sleep apnea    Pulmonary exam normal        Cardiovascular hypertension, Pt. on medications Normal cardiovascular exam     Neuro/Psych  Headaches  negative psych ROS   GI/Hepatic Neg liver ROS,GERD  Medicated,,  Endo/Other  diabetes    Renal/GU negative Renal ROS     Musculoskeletal  (+) Arthritis ,    Abdominal   Peds  Hematology  (+) anemia   Anesthesia Other Findings   Reproductive/Obstetrics                              Anesthesia Physical Anesthesia Plan  ASA: 3  Anesthesia Plan: Spinal   Post-op Pain Management: Tylenol  PO (pre-op)*   Induction: Intravenous  PONV Risk Score and Plan: 3 and Ondansetron , Dexamethasone  and Midazolam   Airway Management Planned: Natural Airway and Simple Face Mask  Additional Equipment: None  Intra-op Plan:   Post-operative Plan:   Informed Consent: I have reviewed the patients History and Physical, chart, labs and discussed the procedure including the risks, benefits and alternatives for the proposed anesthesia with the patient or authorized representative who has indicated his/her understanding and acceptance.       Plan Discussed with: CRNA  Anesthesia Plan Comments: (Lab Results      Component                Value               Date                      WBC                      8.2                 01/17/2025                HGB                      12.9                01/17/2025                HCT                      42.6                01/17/2025                MCV                      83.9                01/17/2025                PLT  203                  01/17/2025           )         Anesthesia Quick Evaluation  "

## 2025-01-25 NOTE — Evaluation (Signed)
 Physical Therapy Evaluation Patient Details Name: Diana Bowers MRN: [id number] DOB: 26-May-1951 Today's Date: 01/25/2025  History of Present Illness  Pt s/p L THR and with hx of R THR and bil TKR  Clinical Impression  Pt s/p L THR and presents with decreased L LE strength/ROM and post op pain limiting functional mobility.  This date, pt requiring increased time and significant assist to exit bed and ambulate short distance in room.  Pt should progress to dc home with family assist.        If plan is discharge home, recommend the following: A little help with walking and/or transfers;A little help with bathing/dressing/bathroom;Assistance with cooking/housework;Assist for transportation;Help with stairs or ramp for entrance   Can travel by private vehicle        Equipment Recommendations    Recommendations for Other Services       Functional Status Assessment Patient has had a recent decline in their functional status and demonstrates the ability to make significant improvements in function in a reasonable and predictable amount of time.     Precautions / Restrictions Precautions Precautions: Fall Restrictions Weight Bearing Restrictions Per Provider Order: Yes LLE Weight Bearing Per Provider Order: Weight bearing as tolerated      Mobility  Bed Mobility Overal bed mobility: Needs Assistance Bed Mobility: Supine to Sit     Supine to sit: HOB elevated, Used rails, Min assist, Mod assist, +2 for physical assistance, +2 for safety/equipment     General bed mobility comments: INcreased time with cues for sequence    Transfers Overall transfer level: Needs assistance Equipment used: Rolling walker (2 wheels) Transfers: Sit to/from Stand Sit to Stand: Min assist, +2 physical assistance, +2 safety/equipment, From elevated surface           General transfer comment: cues for LE management and use of UEs to self assist    Ambulation/Gait Ambulation/Gait assistance: Min  assist, +2 physical assistance, +2 safety/equipment Gait Distance (Feet): 8 Feet Assistive device: Rolling walker (2 wheels) Gait Pattern/deviations: Step-to pattern, Decreased step length - right, Decreased step length - left, Shuffle, Trunk flexed Gait velocity: decr     General Gait Details: cues for sequence, posture and position from [employer]            Wheelchair Mobility     Tilt Bed    Modified Rankin (Stroke Patients Only)       Balance Overall balance assessment: Needs assistance Sitting-balance support: No upper extremity supported, Feet supported Sitting balance-Leahy Scale: Good     Standing balance support: Bilateral upper extremity supported Standing balance-Leahy Scale: Poor                               Pertinent Vitals/Pain Pain Assessment Pain Assessment: 0-10 Pain Score: 7  Pain Location: L hip Pain Descriptors / Indicators: Aching, Sore Pain Intervention(s): Limited activity within patient's tolerance, Monitored during session, Premedicated before session, Ice applied    Home Living Family/patient expects to be discharged to:: Private residence Living Arrangements: Spouse/significant other Available Help at Discharge: Family Type of Home: House Home Access: Stairs to enter Entrance Stairs-Rails: None Entrance Stairs-Number of Steps: 2   Home Layout: One level Home Equipment: [employer] (2 wheels);Cane - single point      Prior Function Prior Level of Function : Independent/Modified Independent  Extremity/Trunk Assessment   Upper Extremity Assessment Upper Extremity Assessment: Overall WFL for tasks assessed    Lower Extremity Assessment Lower Extremity Assessment: LLE deficits/detail    Cervical / Trunk Assessment Cervical / Trunk Assessment: Normal  Communication   Communication Communication: No apparent difficulties    Cognition Arousal: Alert Behavior During Therapy:  WFL for tasks assessed/performed   PT - Cognitive impairments: No apparent impairments                         Following commands: Intact       Cueing Cueing Techniques: Verbal cues     General Comments      Exercises Total Joint Exercises Ankle Circles/Pumps: AROM, Both, 15 reps, Supine   Assessment/Plan    PT Assessment Patient needs continued PT services  PT Problem List Decreased strength;Decreased range of motion;Decreased activity tolerance;Decreased balance;Decreased mobility;Decreased knowledge of use of DME;Pain       PT Treatment Interventions DME instruction;Gait training;Stair training;Functional mobility training;Therapeutic activities;Therapeutic exercise;Patient/family education    PT Goals (Current goals can be found in the Care Plan section)  Acute Rehab PT Goals Patient Stated Goal: Regain IND PT Goal Formulation: With patient Time For Goal Achievement: 01/31/25 Potential to Achieve Goals: Good    Frequency 7X/week     Co-evaluation               AM-PAC PT 6 Clicks Mobility  Outcome Measure Help needed turning from your back to your side while in a flat bed without using bedrails?: A Lot Help needed moving from lying on your back to sitting on the side of a flat bed without using bedrails?: A Lot Help needed moving to and from a bed to a chair (including a wheelchair)?: A Lot Help needed standing up from a chair using your arms (e.g., wheelchair or bedside chair)?: A Lot Help needed to walk in hospital room?: A Lot Help needed climbing 3-5 steps with a railing? : Total 6 Click Score: 11    End of Session Equipment Utilized During Treatment: Gait belt Activity Tolerance: Patient limited by fatigue;Patient limited by pain Patient left: in chair;with call bell/phone within reach;with chair alarm set Nurse Communication: Mobility status PT Visit Diagnosis: Difficulty in walking, not elsewhere classified (R26.2)    Time:  8356-8291 PT Time Calculation (min) (ACUTE ONLY): 25 min   Charges:   PT Evaluation $PT Eval Low Complexity: 1 Low PT Treatments $Gait Training: 8-22 mins PT General Charges $$ ACUTE PT VISIT: 1 Visit         [facility] PT Acute Rehabilitation Services Office [phone]   Kyona Chauncey 01/25/2025, 5:25 PM

## 2025-01-25 NOTE — Op Note (Signed)
 "    Operative Note  Date of operation: 01/25/2025 Preoperative diagnosis: Left hip primary osteoarthritis Postoperative diagnosis: Same  Procedure: Left direct anterior total hip arthroplasty  Implants: Implant Name Type Inv. Item Serial No. Manufacturer Lot No. LRB No. Used Action  CUP SECTOR GRIPTON - [id number] Cup CUP SECTOR GRIPTON  [employer]PU[employer] [id number] Left 1 Implanted  LINER ACET PNNCL PLUS4 NEUTRAL - [id number] Hips LINER ACET PNNCL PLUS4 NEUTRAL  DEPUY ORTHOPAEDICS [id number] Left 1 Implanted  STEM FEMORAL SZ6 HIGH ACTIS - [id number] Stem STEM FEMORAL SZ6 HIGH ACTIS  DEPUY ORTHOPAEDICS [id number] Left 1 Implanted  BALL HIP CERAMIC - [id number] Hips BALL HIP CERAMIC  DEPUY ORTHOPA[employer] [id number] Left 1 Implanted   Surgeon: Lonni GRADE. Vernetta, MD Assistant: Tory Gaskins, PA-C  Anesthesia: Spinal EBL: 250 cc Antibiotics: IV Ancef  Complications: None  Indications: The patient is a 74 year old female well-known to us .  She has well-documented severe arthritis involving her left hip.  She has tried and failed all forms of conservative treatment for over a year now.  She wished to proceed with a left hip replacement at this point and we agree with this as well-based on her clinical exam and x-ray findings.  We have replaced her right hip in the past as well as both knees.  She is fully aware of the risks and benefits of joint replacement surgery given all that she has had before.  She understands that our goals are hopefully decreased pain, improved mobility and improve quality of life.  Procedure description: After informed consent was obtained and appropriate left hip was marked, the patient was brought to the operating room and sat up on the stretcher where spinal anesthesia was obtained.  She was then laid in supine position on stretcher and a Foley catheter was placed.  Traction boots were placed on both her feet and then she was placed supine on the Hana fracture table with  a perineal post and placed in both legs and inline skeletal traction places no traction applied.  Her left operative hip and pelvis were assessed radiographically.  The left hip was prepped and draped with DuraPrep and sterile drapes.  A timeout was called and she was then offended the correct patient and correct the left hip.  An incision was then made just inferior and posterior to the ASIS and carried slightly obliquely down the leg.  Dissection was carried down to the tensor fascia lata muscle and the tensor fascia was then divided longitudinally to proceed with a direct intra broach the hip.  Circumflex vessels were identified and cauterized.  The hip capsule identified and opened up in L-type format finding a moderate joint effusion.  Cobra retractors were then placed around the medial and lateral femoral neck and a femoral neck cut was made with an oscillating saw just proximal to the lesser trochanter and this cut was completed with an osteotome.  A corkscrew guide was placed in the femoral head and the femoral head was removed in its entirety and there was a wide area devoid of cartilage.  A bent Hohmann was then placed over the medial acetabular rim and parts of the acetabular labrum and debris removed.  Reaming was then initiated from a size 43 reamer and stepwise increments going up to a size 49 reamer with all reamers placed under direct visualization and the last reamer also placed under direct fluoroscopy in order to obtain the depth and reaming, the inclination and anteversion.  The real [employer]  GRIPTION acetabular component size 50 was then placed without difficulty followed by a 32+4 polythene liner.  Attention was then turned to the femur.  With the left leg externally rotated to 120 degrees, extended and adducted, a Mueller retractors placed medially and a Hohmann tractor by the greater trochanter.  The lateral joint capsule was released and a box cutting osteotome was used into the femoral  canal.  Broaching was then initiated using the Actis broaching system from a size 0 going up to a size 6.  With a size 6 in place we trialed a standard offset femoral neck based off her anatomy and a 32+1 trial head ball.  We brought the left leg over and up and with traction and internal rotation reduced the pelvis.  Based on clinical and radiographic assessment we felt like we needed more offset and leg length.  We dislocated the hip and of the trial components.  We then placed the real Actis femoral component size 6 with high offset and the real 32+5 ceramic head ball.  Again this reduced in the pelvis and we are pleased with radiographic and clinical assessment of stability and implant placement.  The soft tissue was then irrigated with normal saline solution using pulsatile lavage.  Part of the joint capsule were closed with interrupted #1 Ethibond suture followed by #1 Vicryl close tensor fascia.  0 Vicryl was used to close deep tissue and 2-0 Vicryl was used to close subcutaneous tissue.  The skin was closed with staples.  Well-padded sterile dressing was applied.  The patient was taken the recovery room.  Tory Gaskins, PA-C did assist during the entire case and beginning to end and his assistance was crucial and medically necessary for soft tissue management and retraction, helping guide implant placement and a layered closure of the wound. "

## 2025-01-25 NOTE — Anesthesia Procedure Notes (Signed)
 Procedure Name: MAC Date/Time: 01/25/2025 9:53 AM  Performed by: Zulema Leita PARAS, CRNAPre-anesthesia Checklist: Patient identified, Emergency Drugs available, Suction available and Patient being monitored Oxygen Delivery Method: Simple face mask

## 2025-01-25 NOTE — Anesthesia Postprocedure Evaluation (Addendum)
"   Anesthesia Post Note  Patient: Diana Bowers  Procedure(s) Performed: ARTHROPLASTY, HIP, TOTAL, ANTERIOR APPROACH (Left: Hip)     Patient location during evaluation: PACU Anesthesia Type: Spinal Level of consciousness: oriented and awake and alert Pain management: pain level controlled Vital Signs Assessment: post-procedure vital signs reviewed and stable Respiratory status: spontaneous breathing, respiratory function stable and patient connected to nasal cannula oxygen Cardiovascular status: blood pressure returned to baseline and stable Postop Assessment: no headache, no backache and no apparent nausea or vomiting Anesthetic complications: no   No notable events documented.  Last Vitals:  Vitals:   01/25/25 1415 01/25/25 1422  BP: (!) 144/93   Pulse: 65 65  Resp: (!) 24 (!) 21  Temp: 36.4 C   SpO2: 100% 100%      LLE Motor Response: Purposeful movement (01/25/25 1415) LLE Sensation: Full sensation (01/25/25 1415) RLE Motor Response: Purposeful movement (01/25/25 1415) RLE Sensation: Full sensation (01/25/25 1415) L Sensory Level: S1-Sole of foot, small toes (01/25/25 1415) R Sensory Level: S1-Sole of foot, small toes (01/25/25 1415)  [name]      "

## 2025-01-25 NOTE — Progress Notes (Signed)
" °   01/25/25 2307  BiPAP/CPAP/SIPAP  $ Non-Invasive Home Ventilator  Initial  BiPAP/CPAP/SIPAP Pt Type Adult  BiPAP/CPAP/SIPAP Resmed  Mask Type Nasal mask  Dentures removed? Not applicable  Respiratory Rate 18 breaths/min  EPAP 7 cmH2O  FiO2 (%) 21 %  Patient Home Machine No  Patient Home Mask Yes  Patient Home Tubing Yes  Auto Titrate No  Nasal massage performed Yes  CPAP/SIPAP surface wiped down Yes  Device Plugged into RED Power Outlet Yes  BiPAP/CPAP /SiPAP Vitals  Pulse Rate 88  Resp 18  SpO2 97 %  Bilateral Breath Sounds Clear;Diminished  MEWS Score/Color  MEWS Score 0  MEWS Score Color Green    "

## 2025-01-26 LAB — CBC
HCT: 35.3 % — ABNORMAL LOW (ref 36.0–46.0)
Hemoglobin: 11.4 g/dL — ABNORMAL LOW (ref 12.0–15.0)
MCH: 25.9 pg — ABNORMAL LOW (ref 26.0–34.0)
MCHC: 32.3 g/dL (ref 30.0–36.0)
MCV: 80.2 fL (ref 80.0–100.0)
Platelets: 169 10*3/uL (ref 150–400)
RBC: 4.4 MIL/uL (ref 3.87–5.11)
RDW: 13.6 % (ref 11.5–15.5)
WBC: 11.4 10*3/uL — ABNORMAL HIGH (ref 4.0–10.5)
nRBC: 0 % (ref 0.0–0.2)

## 2025-01-26 LAB — BASIC METABOLIC PANEL WITH GFR
Anion gap: 11 (ref 5–15)
BUN: 11 mg/dL (ref 8–23)
CO2: 24 mmol/L (ref 22–32)
Calcium: 9.2 mg/dL (ref 8.9–10.3)
Chloride: 103 mmol/L (ref 98–111)
Creatinine, Ser: 0.96 mg/dL (ref 0.44–1.00)
GFR, Estimated: >60 mL/min
Glucose, Bld: 136 mg/dL — ABNORMAL HIGH (ref 70–99)
Potassium: 4.2 mmol/L (ref 3.5–5.1)
Sodium: 138 mmol/L (ref 135–145)

## 2025-01-26 MED ORDER — TIZANIDINE HCL 4 MG PO TABS
4.0000 mg | ORAL_TABLET | Freq: Four times a day (QID) | ORAL | Status: DC | PRN
  Administered 2025-01-26 – 2025-01-28 (×6): 4 mg via ORAL
  Filled 2025-01-26 (×6): qty 1

## 2025-01-26 MED ORDER — ASPIRIN 81 MG PO CHEW: 81.0000 mg | CHEWABLE_TABLET | Freq: Two times a day (BID) | ORAL | 0 refills | Status: AC

## 2025-01-26 MED ORDER — TIZANIDINE HCL 4 MG PO TABS: 4.0000 mg | ORAL_TABLET | Freq: Four times a day (QID) | ORAL | 0 refills | Status: AC | PRN

## 2025-01-26 MED ORDER — HYDROCODONE-ACETAMINOPHEN 5-325 MG PO TABS: 1.0000 | ORAL_TABLET | ORAL | 0 refills | Status: AC | PRN

## 2025-01-26 NOTE — Discharge Instructions (Signed)
 Per [facility] clinic policy, our goal is ensure optimal postoperative pain control with a multimodal pain management strategy. For all [facility] patients, our goal is to wean post-operative narcotic medications by 6 weeks post-operatively. If this is not possible due to utilization of pain medication prior to surgery, your [facility] doctor will support your acute post-operative pain control for the first 6 weeks postoperatively, with a plan to transition you back to your primary pain team following that. Diana Bowers will work to ensure a Therapist, occupational.  INSTRUCTIONS AFTER JOINT REPLACEMENT   Remove items at home which could result in a fall. This includes throw rugs or furniture in walking pathways ICE to the affected joint every three hours while awake for 30 minutes at a time, for at least the first 3-5 days, and then as needed for pain and swelling.  Continue to use ice for pain and swelling. You may notice swelling that will progress down to the foot and ankle.  This is normal after surgery.  Elevate your leg when you are not up walking on it.   Continue to use the breathing machine you got in the hospital (incentive spirometer) which will help keep your temperature down.  It is common for your temperature to cycle up and down following surgery, especially at night when you are not up moving around and exerting yourself.  The breathing machine keeps your lungs expanded and your temperature down.   DIET:  As you were doing prior to hospitalization, we recommend a well-balanced diet.  DRESSING / WOUND CARE / SHOWERING  Keep the surgical dressing until follow up.  The dressing is water  proof, so you can shower without any extra covering.  IF THE DRESSING FALLS OFF or the wound gets wet inside, change the dressing with sterile gauze.  Please use good hand washing techniques before changing the dressing.  Do not use any lotions or creams on the incision until instructed by your surgeon.     ACTIVITY  Increase activity slowly as tolerated, but follow the weight bearing instructions below.   No driving for 6 weeks or until further direction given by your physician.  You cannot drive while taking narcotics.  No lifting or carrying greater than 10 lbs. until further directed by your surgeon. Avoid periods of inactivity such as sitting longer than an hour when not asleep. This helps prevent blood clots.  You may return to work once you are authorized by your doctor.     WEIGHT BEARING   Weight bearing as tolerated with assist device (walker, cane, etc) as directed, use it as long as suggested by your surgeon or therapist, typically at least 4-6 weeks.   EXERCISES  Results after joint replacement surgery are often greatly improved when you follow the exercise, range of motion and muscle strengthening exercises prescribed by your doctor. Safety measures are also important to protect the joint from further injury. Any time any of these exercises cause you to have increased pain or swelling, decrease what you are doing until you are comfortable again and then slowly increase them. If you have problems or questions, call your caregiver or physical therapist for advice.   Rehabilitation is important following a joint replacement. After just a few days of immobilization, the muscles of the leg can become weakened and shrink (atrophy).  These exercises are designed to build up the tone and strength of the thigh and leg muscles and to improve motion. Often times heat used for twenty to thirty minutes before  working out will loosen up your tissues and help with improving the range of motion but do not use heat for the first two weeks following surgery (sometimes heat can increase post-operative swelling).   These exercises can be done on a training (exercise) mat, on the floor, on a table or on a bed. Use whatever works the best and is most comfortable for you.    Use music or television  while you are exercising so that the exercises are a pleasant break in your day. This will make your life better with the exercises acting as a break in your routine that you can look forward to.   Perform all exercises about fifteen times, three times per day or as directed.  You should exercise both the operative leg and the other leg as well.  Exercises include:   Quad Sets - Tighten up the muscle on the front of the thigh (Quad) and hold for 5-10 seconds.   Straight Leg Raises - With your knee straight (if you were given a brace, keep it on), lift the leg to 60 degrees, hold for 3 seconds, and slowly lower the leg.  Perform this exercise against resistance later as your leg gets stronger.  Leg Slides: Lying on your back, slowly slide your foot toward your buttocks, bending your knee up off the floor (only go as far as is comfortable). Then slowly slide your foot back down until your leg is flat on the floor again.  Angel Wings: Lying on your back spread your legs to the side as far apart as you can without causing discomfort.  Hamstring Strength:  Lying on your back, push your heel against the floor with your leg straight by tightening up the muscles of your buttocks.  Repeat, but this time bend your knee to a comfortable angle, and push your heel against the floor.  You may put a pillow under the heel to make it more comfortable if necessary.   A rehabilitation program following joint replacement surgery can speed recovery and prevent re-injury in the future due to weakened muscles. Contact your doctor or a physical therapist for more information on knee rehabilitation.    CONSTIPATION  Constipation is defined medically as fewer than three stools per week and severe constipation as less than one stool per week.  Even if you have a regular bowel pattern at home, your normal regimen is likely to be disrupted due to multiple reasons following surgery.  Combination of anesthesia, postoperative  narcotics, change in appetite and fluid intake all can affect your bowels.   YOU MUST use at least one of the following options; they are listed in order of increasing strength to get the job done.  They are all available over the counter, and you may need to use some, POSSIBLY even all of these options:    Drink plenty of fluids (prune juice may be helpful) and high fiber foods Colace 100 mg by mouth twice a day  Senokot for constipation as directed and as needed Dulcolax (bisacodyl), take with full glass of water  Miralax (polyethylene glycol) once or twice a day as needed.  If you have tried all these things and are unable to have a bowel movement in the first 3-4 days after surgery call either your surgeon or your primary doctor.    If you experience loose stools or diarrhea, hold the medications until you stool forms back up.  If your symptoms do not get better within 1 week  or if they get worse, check with your doctor.  If you experience the worst abdominal pain ever or develop nausea or vomiting, please contact the office immediately for further recommendations for treatment.   ITCHING:  If you experience itching with your medications, try taking only a single pain pill, or even half a pain pill at a time.  You can also use Benadryl  over the counter for itching or also to help with sleep.   TED HOSE STOCKINGS:  Use stockings on both legs until for at least 2 weeks or as directed by physician office. They may be removed at night for sleeping.  MEDICATIONS:  See your medication summary on the "After Visit Summary" that nursing will review with you.  You may have some home medications which will be placed on hold until you complete the course of blood thinner medication.  It is important for you to complete the blood thinner medication as prescribed.  PRECAUTIONS:  If you experience chest pain or shortness of breath - call 911 immediately for transfer to the hospital emergency department.    If you develop a fever greater that 101 F, purulent drainage from wound, increased redness or drainage from wound, foul odor from the wound/dressing, or calf pain - CONTACT YOUR SURGEON.                                                   FOLLOW-UP APPOINTMENTS:  If you do not already have a post-op appointment, please call the office for an appointment to be seen by your surgeon.  Guidelines for how soon to be seen are listed in your "After Visit Summary", but are typically between 1-4 weeks after surgery.  OTHER INSTRUCTIONS:   Knee Replacement:  Do not place pillow under knee, focus on keeping the knee straight while resting. CPM instructions: 0-90 degrees, 2 hours in the morning, 2 hours in the afternoon, and 2 hours in the evening. Place foam block, curve side up under heel at all times except when in CPM or when walking.  DO NOT modify, tear, cut, or change the foam block in any way.  POST-OPERATIVE OPIOID TAPER INSTRUCTIONS: It is important to wean off of your opioid medication as soon as possible. If you do not need pain medication after your surgery it is ok to stop day one. Opioids include: Codeine, Hydrocodone(Norco, Vicodin), Oxycodone (Percocet, oxycontin ) and hydromorphone  amongst others.  Long term and even short term use of opiods can cause: Increased pain response Dependence Constipation Depression Respiratory depression And more.  Withdrawal symptoms can include Flu like symptoms Nausea, vomiting And more Techniques to manage these symptoms Hydrate well Eat regular healthy meals Stay active Use relaxation techniques(deep breathing, meditating, yoga) Do Not substitute Alcohol to help with tapering If you have been on opioids for less than two weeks and do not have pain than it is ok to stop all together.  Plan to wean off of opioids This plan should start within one week post op of your joint replacement. Maintain the same interval or time between taking each dose  and first decrease the dose.  Cut the total daily intake of opioids by one tablet each day Next start to increase the time between doses. The last dose that should be eliminated is the evening dose.   MAKE SURE YOU:  Understand these instructions.  Get help right away if you are not doing well or get worse.    Thank you for letting us  be a part of your medical care team.  It is a privilege we respect greatly.  We hope these instructions will help you stay on track for a fast and full recovery!     Dental Antibiotics:  In most cases prophylactic antibiotics for Dental procdeures after total joint surgery are not necessary.  Exceptions are as follows:  1. History of prior total joint infection  2. Severely immunocompromised (Organ Transplant, cancer chemotherapy, Rheumatoid biologic meds such as Humera)  3. Poorly controlled diabetes (A1C &gt; 8.0, blood glucose over 200)  If you have one of these conditions, contact your surgeon for an antibiotic prescription, prior to your dental procedure.

## 2025-01-26 NOTE — Progress Notes (Signed)
 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  Pt requiring increased time but up to ambulate ~38' in hallway.  Distance ltd by fatigue.    If plan is discharge home, recommend the following: A little help with walking and/or transfers;A little help with bathing/dressing/bathroom;Assistance with cooking/housework;Assist for transportation;Help with stairs or ramp for entrance   Can travel by private vehicle        Equipment Recommendations       Recommendations for Other Services       Precautions / Restrictions Precautions Precautions: Fall Restrictions Weight Bearing Restrictions Per Provider Order: Yes LLE Weight Bearing Per Provider Order: Weight bearing as tolerated     Mobility  Bed Mobility               General bed mobility comments: Pt up in recliner and requests back to same    Transfers Overall transfer level: Needs assistance Equipment used: Rolling walker (2 wheels) Transfers: Sit to/from Stand Sit to Stand: From elevated surface, Min assist, Mod assist           General transfer comment: cues for LE management and use of UEs to self assist; physical assist to bring wt up and fwd and to balance in initial standing    Ambulation/Gait Ambulation/Gait assistance: Min assist Gait Distance (Feet): 38 Feet Assistive device: Rolling walker (2 wheels) Gait Pattern/deviations: Step-to pattern, Decreased step length - right, Decreased step length - left, Shuffle, Trunk flexed Gait velocity: decr     General Gait Details: cues for sequence, posture and position from [employer]             Wheelchair Mobility     Tilt Bed    Modified Rankin (Stroke Patients Only)       Balance Overall balance  assessment: Needs assistance Sitting-balance support: No upper extremity supported, Feet supported Sitting balance-Leahy Scale: Good     Standing balance support: Bilateral upper extremity supported Standing balance-Leahy Scale: Poor                              Communication Communication Communication: No apparent difficulties  Cognition Arousal: Alert Behavior During Therapy: WFL for tasks assessed/performed   PT - Cognitive impairments: No apparent impairments                         Following commands: Intact      Cueing Cueing Techniques: Verbal cues  Exercises Total Joint Exercises Ankle Circles/Pumps: AROM, Both, 15 reps, Supine Quad Sets: AROM, Both, 10 reps, Supine Heel Slides: AAROM, Left, 20 reps, Supine Hip ABduction/ADduction: AAROM, Left, 15 reps, Supine    General Comments        Pertinent Vitals/Pain Pain Assessment Pain Assessment: 0-10 Pain Score: 5  Pain Location: L hip Pain Descriptors / Indicators: Aching, Sore Pain Intervention(s): Limited activity within patient's tolerance, Monitored during session, Premedicated before session, Ice applied    Home Living                          Prior Function            PT  Goals (current goals can now be found in the care plan section) Acute Rehab PT Goals Patient Stated Goal: Regain IND PT Goal Formulation: With patient Time For Goal Achievement: 01/31/25 Potential to Achieve Goals: Good Progress towards PT goals: Progressing toward goals    Frequency    7X/week      PT Plan      Co-evaluation              AM-PAC PT 6 Clicks Mobility   Outcome Measure  Help needed turning from your back to your side while in a flat bed without using bedrails?: A Lot Help needed moving from lying on your back to sitting on the side of a flat bed without using bedrails?: A Lot Help needed moving to and from a bed to a chair (including a wheelchair)?: A Lot Help  needed standing up from a chair using your arms (e.g., wheelchair or bedside chair)?: A Little Help needed to walk in hospital room?: A Little Help needed climbing 3-5 steps with a railing? : A Lot 6 Click Score: 14    End of Session Equipment Utilized During Treatment: Gait belt Activity Tolerance: Patient limited by fatigue Patient left: in chair;with call bell/phone within reach;with chair alarm set;with nursing/sitter in room Nurse Communication: Mobility status PT Visit Diagnosis: Difficulty in walking, not elsewhere classified (R26.2)     Time: 8494-8478 PT Time Calculation (min) (ACUTE ONLY): 16 min  Charges:    $Gait Training: 8-22 mins PT General Charges $$ ACUTE PT VISIT: 1 Visit                     [facility] PT Acute Rehabilitation Services Office [phone]    Harden Bramer 01/26/2025, 4:05 PM

## 2025-01-26 NOTE — Progress Notes (Signed)
 Subjective: 1 Day Post-Op Procedures (LRB): ARTHROPLASTY, HIP, TOTAL, ANTERIOR APPROACH (Left) Patient reports pain as moderate.  Limited mobility with PT.  Objective: Vital signs in last 24 hours: Temp:  [97 F (36.1 C)-99.5 F (37.5 C)] 99.5 F (37.5 C) (01/31 0549) Pulse Rate:  [51-90] 85 (01/31 0549) Resp:  [10-24] 18 (01/31 0549) BP: (123-176)/(57-93) 154/69 (01/31 0549) SpO2:  [91 %-100 %] 99 % (01/31 0549) FiO2 (%):  [21 %] 21 % (01/30 2307)  Intake/Output from previous day: 01/30 0701 - 01/31 0700 In: 6111 [P.O.:1680; I.V.:4031; IV Piggyback:400] Out: 4100 [Urine:3850; Blood:250] Intake/Output this shift: No intake/output data recorded.  Recent Labs    01/26/25 0313  HGB 11.4*   Recent Labs    01/26/25 0313  WBC 11.4*  RBC 4.40  HCT 35.3*  PLT 169   Recent Labs    01/26/25 0313  NA 138  K 4.2  CL 103  CO2 24  BUN 11  CREATININE 0.96  GLUCOSE 136*  CALCIUM  9.2   No results for input(s): LABPT, INR in the last 72 hours.  Sensation intact distally Intact pulses distally Dorsiflexion/Plantar flexion intact Incision: dressing C/D/I   Assessment/Plan: 1 Day Post-Op Procedures (LRB): ARTHROPLASTY, HIP, TOTAL, ANTERIOR APPROACH (Left) Up with therapy      [name] 01/26/2025, 9:25 AM

## 2025-01-26 NOTE — TOC Transition Note (Signed)
 Transition of Care [facility]) - Discharge Note   Patient Details  Name: Diana Bowers MRN: [id number] Date of Birth: February 02, 1951  [facility] CM/SW Contact:  NORMAN ASPEN, LCSW Phone Number: 01/26/2025, 12:05 PM   Clinical Narrative:     Met with pt who confirm she has all needed DME in the home.  HHPT prearranged with [facility] via ortho MD office.  No futher IP CM needs.  Final next level of care: Home w Home Health Services Barriers to Discharge: No Barriers Identified   Patient Goals and CMS Choice Patient states their goals for this hospitalization and ongoing recovery are:: return home          Discharge Placement                       Discharge Plan and Services Additional resources added to the After Visit Summary for                  DME Arranged: N/A DME Agency: NA       HH Arranged: PT HH Agency: [facility]        Social Drivers of Health (SDOH) Interventions SDOH Screenings   Food Insecurity: No Food Insecurity (01/25/2025)  Housing: Low Risk (01/25/2025)  Transportation Needs: No Transportation Needs (01/25/2025)  Utilities: Not At Risk (01/25/2025)  Social Connections: Moderately Isolated (01/25/2025)  Tobacco Use: Low Risk (01/25/2025)     Readmission Risk Interventions     No data to display

## 2025-01-26 NOTE — Plan of Care (Signed)
" °  Problem: Education: Goal: Knowledge of the prescribed therapeutic regimen will improve Outcome: Progressing   Problem: Activity: Goal: Risk for activity intolerance will decrease Outcome: Progressing   Problem: Pain Managment: Goal: General experience of comfort will improve and/or be controlled Outcome: Progressing   Problem: Safety: Goal: Ability to remain free from injury will improve Outcome: Progressing   "

## 2025-01-27 NOTE — Progress Notes (Signed)
 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  This pm, pt up to bathroom for toileting with hand hygiene standing at sink and up to ambulate increased distance in hall.  Pt hopeful for dc home tomorrow.    If plan is discharge home, recommend the following: A little help with walking and/or transfers;A little help with bathing/dressing/bathroom;Assistance with cooking/housework;Assist for transportation;Help with stairs or ramp for entrance   Can travel by private vehicle        Equipment Recommendations       Recommendations for Other Services       Precautions / Restrictions Precautions Precautions: Fall Restrictions Weight Bearing Restrictions Per Provider Order: No LLE Weight Bearing Per Provider Order: Weight bearing as tolerated     Mobility  Bed Mobility Overal bed mobility: Needs Assistance Bed Mobility: Supine to Sit, Sit to Supine     Supine to sit: HOB elevated, Used rails, Min assist Sit to supine: Min assist   General bed mobility comments: Increased time, use of rails, cues for sequence and use of R LE to self assist    Transfers Overall transfer level: Needs assistance Equipment used: Rolling walker (2 wheels) Transfers: Sit to/from Stand Sit to Stand: From elevated surface, Contact guard assist           General transfer comment: cues for LE management and use of UEs to self assis    Ambulation/Gait Ambulation/Gait assistance: Contact guard assist Gait Distance (Feet): 90 Feet (and 15' into bathroom) Assistive device: Rolling walker (2 wheels) Gait Pattern/deviations: Step-to pattern, Decreased step length - right, Decreased step  length - left, Shuffle, Trunk flexed Gait velocity: decr     General Gait Details: Increased time with min cues for sequence, posture and position from RW   Stairs Stairs:  (Pt declines to attempt stairs 2* fatigue)           Wheelchair Mobility     Tilt Bed    Modified Rankin (Stroke Patients Only)       Balance Overall balance assessment: Needs assistance Sitting-balance support: No upper extremity supported, Feet supported Sitting balance-Leahy Scale: Good     Standing balance support: No upper extremity supported Standing balance-Leahy Scale: Fair Standing balance comment: standing at sink for hand hygiene                            Communication Communication Communication: No apparent difficulties  Cognition Arousal: Alert Behavior During Therapy: WFL for tasks assessed/performed   PT - Cognitive impairments: No apparent impairments                         Following commands: Intact      Cueing Cueing Techniques: Verbal cues  Exercises Total Joint Exercises Ankle Circles/Pumps: AROM, Both, 15 reps, Supine Quad Sets: AROM, Both, 10 reps, Supine Heel Slides: AAROM, Left, 20 reps, Supine Hip ABduction/ADduction: AAROM, Left, 15 reps, Supine    General Comments        Pertinent Vitals/Pain Pain Assessment Pain Assessment: 0-10 Pain Score: 3  Pain Location: L hip Pain Descriptors / Indicators: Aching, Sore Pain Intervention(s):  Limited activity within patient's tolerance, Monitored during session, Premedicated before session, Ice applied    Home Living                          Prior Function            PT Goals (current goals can now be found in the care plan section) Acute Rehab PT Goals Patient Stated Goal: Regain IND PT Goal Formulation: With patient Time For Goal Achievement: 01/31/25 Potential to Achieve Goals: Good Progress towards PT goals: Progressing toward goals    Frequency     7X/week      PT Plan      Co-evaluation              AM-PAC PT 6 Clicks Mobility   Outcome Measure  Help needed turning from your back to your side while in a flat bed without using bedrails?: A Lot Help needed moving from lying on your back to sitting on the side of a flat bed without using bedrails?: A Lot Help needed moving to and from a bed to a chair (including a wheelchair)?: A Lot Help needed standing up from a chair using your arms (e.g., wheelchair or bedside chair)?: A Little Help needed to walk in hospital room?: A Little Help needed climbing 3-5 steps with a railing? : A Lot 6 Click Score: 14    End of Session Equipment Utilized During Treatment: Gait belt Activity Tolerance: Patient tolerated treatment well Patient left: with call bell/phone within reach;in bed;with bed alarm set Nurse Communication: Mobility status PT Visit Diagnosis: Difficulty in walking, not elsewhere classified (R26.2)     Time: 8595-8562 PT Time Calculation (min) (ACUTE ONLY): 33 min  Charges:    $Gait Training: 8-22 mins $Therapeutic Activity: 8-22 mins PT General Charges $$ ACUTE PT VISIT: 1 Visit                     [facility] PT Acute Rehabilitation Services Office [phone]    [facility] 01/27/2025, 4:52 PM

## 2025-01-27 NOTE — Progress Notes (Signed)
 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  Pt hopeful for dc home tomorrow.   If plan is discharge home, recommend the following: A little help with walking and/or transfers;A little help with bathing/dressing/bathroom;Assistance with cooking/housework;Assist for transportation;Help with stairs or ramp for entrance   Can travel by private vehicle        Equipment Recommendations       Recommendations for Other Services       Precautions / Restrictions Precautions Precautions: Fall Restrictions Weight Bearing Restrictions Per Provider Order: No LLE Weight Bearing Per Provider Order: Weight bearing as tolerated     Mobility  Bed Mobility Overal bed mobility: Needs Assistance Bed Mobility: Supine to Sit     Supine to sit: HOB elevated, Used rails, Min assist     General bed mobility comments: Increased time, use of rails, cues for sequence and use of R LE to self assist    Transfers Overall transfer level: Needs assistance Equipment used: Rolling walker (2 wheels) Transfers: Sit to/from Stand Sit to Stand: From elevated surface, Min assist           General transfer comment: cues for LE management and use of UEs to self assist; steady assist to bring wt up and fwd and to balance in initial standing    Ambulation/Gait Ambulation/Gait assistance: Min assist, Contact guard assist Gait Distance (Feet): 84 Feet Assistive device: Rolling walker (2 wheels) Gait Pattern/deviations: Step-to pattern, Decreased step length - right, Decreased step length - left, Shuffle, Trunk flexed Gait velocity: decr     General Gait  Details: Increased time with cues for sequence, posture and position from [employer]             Wheelchair Mobility     Tilt Bed    Modified Rankin (Stroke Patients Only)       Balance Overall balance assessment: Needs assistance Sitting-balance support: No upper extremity supported, Feet supported Sitting balance-Leahy Scale: Good     Standing balance support: Single extremity supported Standing balance-Leahy Scale: Poor                              Communication Communication Communication: No apparent difficulties  Cognition Arousal: Alert Behavior During Therapy: WFL for tasks assessed/performed   PT - Cognitive impairments: No apparent impairments                         Following commands: Intact      Cueing Cueing Techniques: Verbal cues  Exercises Total Joint Exercises Ankle Circles/Pumps: AROM, Both, 15 reps, Supine Quad Sets: AROM, Both, 10 reps, Supine Heel Slides: AAROM, Left, 20 reps, Supine Hip ABduction/ADduction: AAROM, Left, 15 reps, Supine    General Comments        Pertinent Vitals/Pain Pain Assessment Pain Assessment: 0-10 Pain Score: 4  Pain Location: L hip Pain Descriptors / Indicators: Aching, Sore Pain Intervention(s): Limited activity within patient's tolerance, Monitored during session, Premedicated before session, Ice applied    Home Living  Prior Function            PT Goals (current goals can now be found in the care plan section) Acute Rehab PT Goals Patient Stated Goal: Regain IND PT Goal Formulation: With patient Time For Goal Achievement: 01/31/25 Potential to Achieve Goals: Good Progress towards PT goals: Progressing toward goals    Frequency    7X/week      PT Plan      Co-evaluation              AM-PAC PT 6 Clicks Mobility   Outcome Measure  Help needed turning from your back to your side while in a flat bed without using  bedrails?: A Lot Help needed moving from lying on your back to sitting on the side of a flat bed without using bedrails?: A Lot Help needed moving to and from a bed to a chair (including a wheelchair)?: A Lot Help needed standing up from a chair using your arms (e.g., wheelchair or bedside chair)?: A Little Help needed to walk in hospital room?: A Little Help needed climbing 3-5 steps with a railing? : A Lot 6 Click Score: 14    End of Session Equipment Utilized During Treatment: Gait belt Activity Tolerance: Patient tolerated treatment well Patient left: in chair;with call bell/phone within reach;with chair alarm set Nurse Communication: Mobility status PT Visit Diagnosis: Difficulty in walking, not elsewhere classified (R26.2)     Time: 9160-9081 PT Time Calculation (min) (ACUTE ONLY): 39 min  Charges:    $Gait Training: 8-22 mins $Therapeutic Exercise: 8-22 mins $Therapeutic Activity: 8-22 mins PT General Charges $$ ACUTE PT VISIT: 1 Visit                     [facility] PT Acute Rehabilitation Services Office [phone]    [facility] 01/27/2025, 9:21 AM

## 2025-01-27 NOTE — Progress Notes (Signed)
 Subjective: 2 Days Post-Op Procedures (LRB): ARTHROPLASTY, HIP, TOTAL, ANTERIOR APPROACH (Left) Patient reports pain as moderate.  Limited mobility with PT.  Pain improved overnight  Objective: Vital signs in last 24 hours: Temp:  [98.4 F (36.9 C)-100.3 F (37.9 C)] 99.5 F (37.5 C) (02/01 0619) Pulse Rate:  [70-99] 99 (02/01 0619) Resp:  [17-20] 20 (02/01 0619) BP: (130-158)/(65-84) 158/84 (02/01 0619) SpO2:  [95 %-98 %] 98 % (02/01 0619)  Intake/Output from previous day: 01/31 0701 - 02/01 0700 In: 1329.4 [P.O.:720; I.V.:609.4] Out: 350 [Urine:350] Intake/Output this shift: No intake/output data recorded.  Recent Labs    01/26/25 0313  HGB 11.4*   Recent Labs    01/26/25 0313  WBC 11.4*  RBC 4.40  HCT 35.3*  PLT 169   Recent Labs    01/26/25 0313  NA 138  K 4.2  CL 103  CO2 24  BUN 11  CREATININE 0.96  GLUCOSE 136*  CALCIUM  9.2   No results for input(s): LABPT, INR in the last 72 hours.  Sensation intact distally Intact pulses distally Dorsiflexion/Plantar flexion intact Incision: dressing C/D/I   Assessment/Plan: 2 Days Post-Op Procedures (LRB): ARTHROPLASTY, HIP, TOTAL, ANTERIOR APPROACH (Left) Up with therapy,  possible dc home 2/2      Diana Bowers 01/27/2025, 8:34 AM

## 2025-01-27 NOTE — Care Management Obs Status (Signed)
 MEDICARE OBSERVATION STATUS NOTIFICATION   Patient Details  Name: CHEALSEA PASKE MRN: [id number] Date of Birth: [birth year]   Medicare Observation Status Notification Given:  Yes    Sonda Manuella Quill, RN 01/27/2025, 2:39 PM

## 2025-01-28 ENCOUNTER — Encounter (HOSPITAL_COMMUNITY): Payer: Self-pay | Admitting: Orthopaedic Surgery

## 2025-01-28 NOTE — Plan of Care (Signed)
" °  Problem: Education: Goal: Knowledge of the prescribed therapeutic regimen will improve Outcome: Progressing   Problem: Bowel/Gastric: Goal: Gastrointestinal status for postoperative course will improve Outcome: Progressing   Problem: Cardiac: Goal: Ability to maintain an adequate cardiac output Outcome: Progressing Goal: Will show no evidence of cardiac arrhythmias Outcome: Progressing   Problem: Nutritional: Goal: Will attain and maintain optimal nutritional status Outcome: Progressing   Problem: Neurological: Goal: Will regain or maintain usual level of consciousness Outcome: Progressing   Problem: Clinical Measurements: Goal: Ability to maintain clinical measurements within normal limits Outcome: Progressing Goal: Postoperative complications will be avoided or minimized Outcome: Progressing   Problem: Respiratory: Goal: Will regain and/or maintain adequate ventilation Outcome: Progressing Goal: Respiratory status will improve Outcome: Progressing   Problem: Skin Integrity: Goal: Demonstrates signs of wound healing without infection Outcome: Progressing   Problem: Urinary Elimination: Goal: Will remain free from infection Outcome: Progressing Goal: Ability to achieve and maintain adequate urine output Outcome: Progressing   Problem: Education: Goal: Knowledge of General Education information will improve Description: Including pain rating scale, medication(s)/side effects and non-pharmacologic comfort measures Outcome: Progressing   Problem: Health Behavior/Discharge Planning: Goal: Ability to manage health-related needs will improve Outcome: Progressing   Problem: Clinical Measurements: Goal: Ability to maintain clinical measurements within normal limits will improve Outcome: Progressing Goal: Will remain free from infection Outcome: Progressing Goal: Diagnostic test results will improve Outcome: Progressing Goal: Respiratory complications will  improve Outcome: Progressing Goal: Cardiovascular complication will be avoided Outcome: Progressing   Problem: Activity: Goal: Risk for activity intolerance will decrease Outcome: Progressing   Problem: Nutrition: Goal: Adequate nutrition will be maintained Outcome: Progressing   Problem: Coping: Goal: Level of anxiety will decrease Outcome: Progressing   Problem: Elimination: Goal: Will not experience complications related to bowel motility Outcome: Progressing Goal: Will not experience complications related to urinary retention Outcome: Progressing   Problem: Pain Managment: Goal: General experience of comfort will improve and/or be controlled Outcome: Progressing   Problem: Skin Integrity: Goal: Risk for impaired skin integrity will decrease Outcome: Progressing   Problem: Education: Goal: Knowledge of the prescribed therapeutic regimen will improve Outcome: Progressing Goal: Understanding of discharge needs will improve Outcome: Progressing Goal: Individualized Educational Video(s) Outcome: Progressing   Problem: Activity: Goal: Ability to avoid complications of mobility impairment will improve Outcome: Progressing Goal: Ability to tolerate increased activity will improve Outcome: Progressing   Problem: Clinical Measurements: Goal: Postoperative complications will be avoided or minimized Outcome: Progressing   Problem: Pain Management: Goal: Pain level will decrease with appropriate interventions Outcome: Progressing   Problem: Skin Integrity: Goal: Will show signs of wound healing Outcome: Progressing   "

## 2025-01-29 MED ORDER — BUPIVACAINE IN DEXTROSE 0.75-8.25 % IT SOLN
INTRATHECAL | Status: DC | PRN
  Administered 2025-01-25: 2 mL via INTRATHECAL

## 2025-01-29 NOTE — Addendum Note (Signed)
 Addendum  created 01/29/25 1206 by Tilford Franky BIRCH, MD   Child order released for a procedure order, Clinical Note Signed, Intraprocedure Blocks edited, Intraprocedure Meds edited, SmartForm saved

## 2025-01-29 NOTE — Anesthesia Procedure Notes (Signed)
 Spinal  Start time: 01/25/2025 9:58 AM End time: 01/25/2025 10:01 AM Reason for block: surgical anesthesia  Staffing Performed: anesthesiologist  Authorized by: Tilford Franky BIRCH, MD   Performed by: Tilford Franky BIRCH, MD  Preanesthetic Checklist Completed: patient identified, IV checked, site marked, risks and benefits discussed, surgical consent, monitors and equipment checked, pre-op evaluation and timeout performed Spinal Block Patient position: sitting Prep: DuraPrep and site prepped and draped Location: L3-4 Injection technique: single-shot Needle Needle type: Pencan  Needle gauge: 24 G Needle length: 10 cm Needle insertion depth (cm): 10 Assessment Events: CSF return  Additional Notes Patient tolerated well. No immediate complications.  Functioning IV was confirmed and monitors were applied. Sterile prep and drape, including hand hygiene and sterile gloves were used. The patient was positioned and the back was prepped. The skin was anesthetized with lidocaine . Free flow of clear CSF was obtained prior to injecting local anesthetic into the CSF. The spinal needle aspirated freely following injection. The needle was carefully withdrawn. The patient tolerated the procedure well.

## 2025-02-07 ENCOUNTER — Encounter: Admitting: Orthopaedic Surgery

## 2025-02-14 ENCOUNTER — Encounter: Admitting: Orthopaedic Surgery
